# Patient Record
Sex: Male | Born: 1945 | Race: White | Hispanic: No | Smoking: Former smoker
Health system: Southern US, Community
[De-identification: ages and names within clinical notes are randomized; demographics above are authoritative.]

## PROBLEM LIST (undated history)

## (undated) DIAGNOSIS — I350 Nonrheumatic aortic (valve) stenosis: Secondary | ICD-10-CM

## (undated) DIAGNOSIS — I4891 Unspecified atrial fibrillation: Secondary | ICD-10-CM

## (undated) DIAGNOSIS — K5792 Diverticulitis of intestine, part unspecified, without perforation or abscess without bleeding: Secondary | ICD-10-CM

## (undated) DIAGNOSIS — N321 Vesicointestinal fistula: Secondary | ICD-10-CM

## (undated) DIAGNOSIS — R197 Diarrhea, unspecified: Secondary | ICD-10-CM

## (undated) DIAGNOSIS — I428 Other cardiomyopathies: Secondary | ICD-10-CM

## (undated) DIAGNOSIS — I499 Cardiac arrhythmia, unspecified: Secondary | ICD-10-CM

## (undated) DIAGNOSIS — F419 Anxiety disorder, unspecified: Secondary | ICD-10-CM

## (undated) DIAGNOSIS — R51 Headache: Secondary | ICD-10-CM

## (undated) DIAGNOSIS — N529 Male erectile dysfunction, unspecified: Secondary | ICD-10-CM

## (undated) DIAGNOSIS — R011 Cardiac murmur, unspecified: Secondary | ICD-10-CM

## (undated) HISTORY — DX: Nonrheumatic aortic (valve) stenosis: I35.0

## (undated) HISTORY — PX: HERNIA REPAIR: SHX51

## (undated) HISTORY — DX: Male erectile dysfunction, unspecified: N52.9

## (undated) HISTORY — PX: GUM SURGERY: SHX658

## (undated) HISTORY — PX: APPENDECTOMY: SHX54

## (undated) HISTORY — PX: COLON RESECTION: SHX5231

## (undated) HISTORY — DX: Other cardiomyopathies: I42.8

---

## 2002-01-11 ENCOUNTER — Ambulatory Visit (HOSPITAL_COMMUNITY): Admission: RE | Admit: 2002-01-11 | Discharge: 2002-01-11 | Payer: Self-pay | Admitting: Family Medicine

## 2002-01-11 ENCOUNTER — Encounter: Payer: Self-pay | Admitting: Family Medicine

## 2003-05-02 ENCOUNTER — Ambulatory Visit (HOSPITAL_COMMUNITY): Admission: RE | Admit: 2003-05-02 | Discharge: 2003-05-02 | Payer: Self-pay | Admitting: Family Medicine

## 2003-05-02 ENCOUNTER — Encounter: Payer: Self-pay | Admitting: Family Medicine

## 2005-05-05 ENCOUNTER — Ambulatory Visit (HOSPITAL_COMMUNITY): Admission: RE | Admit: 2005-05-05 | Discharge: 2005-05-05 | Payer: Self-pay | Admitting: Family Medicine

## 2006-05-14 ENCOUNTER — Ambulatory Visit (HOSPITAL_COMMUNITY): Admission: RE | Admit: 2006-05-14 | Discharge: 2006-05-14 | Payer: Self-pay | Admitting: Family Medicine

## 2006-12-10 ENCOUNTER — Encounter: Admission: RE | Admit: 2006-12-10 | Discharge: 2006-12-10 | Payer: Self-pay | Admitting: Neurology

## 2007-04-16 ENCOUNTER — Ambulatory Visit (HOSPITAL_COMMUNITY): Admission: RE | Admit: 2007-04-16 | Discharge: 2007-04-16 | Payer: Self-pay | Admitting: General Surgery

## 2007-04-20 ENCOUNTER — Ambulatory Visit (HOSPITAL_COMMUNITY): Admission: RE | Admit: 2007-04-20 | Discharge: 2007-04-20 | Payer: Self-pay | Admitting: General Surgery

## 2009-08-14 ENCOUNTER — Encounter (INDEPENDENT_AMBULATORY_CARE_PROVIDER_SITE_OTHER): Payer: Self-pay | Admitting: *Deleted

## 2009-09-03 ENCOUNTER — Ambulatory Visit: Payer: Self-pay | Admitting: Gastroenterology

## 2009-09-03 DIAGNOSIS — K5732 Diverticulitis of large intestine without perforation or abscess without bleeding: Secondary | ICD-10-CM

## 2009-09-12 ENCOUNTER — Telehealth (INDEPENDENT_AMBULATORY_CARE_PROVIDER_SITE_OTHER): Payer: Self-pay | Admitting: *Deleted

## 2009-11-20 ENCOUNTER — Encounter: Payer: Self-pay | Admitting: Gastroenterology

## 2009-12-14 ENCOUNTER — Ambulatory Visit (HOSPITAL_COMMUNITY): Admission: RE | Admit: 2009-12-14 | Discharge: 2009-12-14 | Payer: Self-pay | Admitting: Gastroenterology

## 2009-12-14 ENCOUNTER — Ambulatory Visit: Payer: Self-pay | Admitting: Gastroenterology

## 2010-03-26 ENCOUNTER — Ambulatory Visit (HOSPITAL_COMMUNITY): Admission: RE | Admit: 2010-03-26 | Discharge: 2010-03-26 | Payer: Self-pay | Admitting: Family Medicine

## 2010-08-20 NOTE — Letter (Signed)
Summary: Appointment Reminder  [facility]  [address]   [place], [place] [address]   Phone: [phone]  Fax: [phone]       August 14, 2009   Nicholas Sutton [address] [place], [place]  [address] Jul 02, 1946    Dear Nicholas Sutton,  We have been unable to reach you by phone to schedule a follow up   appointment that was recommended for you by Dr. Darrick Penna. It is very   important that we reach you to schedule an appointment. We hope that you  allow [place] to participate in your health care needs. Please contact us at  [phone] at your earliest convenience to schedule your appointment.  Sincerely,    [facility] R. Roetta Sessions, M.D.    Kassie Mends, M.D. Lorenza Burton, FNP-BC    Tana Coast, PA-C Phone: [phone]    Fax: [phone]

## 2010-08-20 NOTE — Progress Notes (Signed)
Summary: Question about Barium-contrast Enema  Phone Note Call from Patient Call back at Home Phone [phone] Call back at [phone]   Caller: Patient Reason for Call: Talk to Doctor Action Taken: Provider Notified Summary of Call: Patient called and stated he would like to speak to the doctor about this test..Marland KitchenHe wanted to know if he could do another procedure besides the air-contrast barium enema.  Please advise? Initial call taken by: Ave Filter,  September 12, 2009 2:25 PM     Appended Document: Question about Barium-contrast Enema Please call pt. The only alternative to BE is a CT scan of the PELVIS with rectal contrast. He need to have his left colon evaluated for stricture prior to prepping fopr a TCS.  Appended Document: Question about Barium-contrast Enema I gave pt Dr Darrick Penna recommendations and he stated he will call back in a few days to schedule.  Appended Document: Question about Barium-contrast Enema Flex sig with two enemas and should be on clear liquids beginning with breakfast the day before the flex sig and if I can get past the area. He may be able to have a complete exam. Schedule for flex sig/possible TCS-1 hour slot.  Appended Document: Question about Barium-contrast Enema I called pt to schedule procedure, no answer, lmom.

## 2010-08-20 NOTE — Assessment & Plan Note (Signed)
Summary: NPP/DIVERTICULITIS.GU   Visit Type:  Initial Consult Referring Provider:  Phillips Odor Primary Care Provider:  Phillips Odor  Chief Complaint:  Diverticulitis.  History of Present Illness: Mr. Nicholas Sutton is a pleasant 65 year old gentleman who presents today for further evaluation of recurrent diverticulitis. He was referred Dr. Assunta Found. He has had several episodes of diverticulitis in the last few years. From records received, he had diverticulitis at time of sigmoidoscopy in September 2008. This was after being treated with antibiotics one month prior in 02/2007. He was also treated empirically for diverticulitis in January 2009 and January 2011.  CT was done after incomplete colonoscopy by Dr. Lovell Sheehan see below. At time of procedure, prep was adequate, divertculitis was found in sigmoid colon, scope could not be passed past the 30cm mark and due to significant inflammation.   He usually has acute onset suprapubic cramping associated with fever. Last episode occurred after eating canned tomatoes and large pickles. He c/o intermittent abdominal cramping as well. Completed last round of Abx two weeks ago and is feeling better.    CT A/P (9/08) -->  1.  Abnormal sigmoid colon wall thickening along a tortuous segment with scattered diverticula.  Although diverticulitis and colitis are in the differential, a constricting lesion or malignancy is not excluded.   2.  No perforation, abscess, or obstruction.  3. Probable numerous hepatic cysts.  Current Medications (verified): 1)  Zyrtec  Otc .... Take 1 Tablet By Mouth Once A Day 2)  Asa 325 Mg .... As Needed For Ha 3)  Tylenol .... As Needed 4)  Advil .... As Needed  Allergies (verified): No Known Drug Allergies  Past History:  Past Medical History: Flex sig,  to 30cm, 9/08, Dr. Clydene Pugh in sigmoid colon  Anxiety Disorder Asthma Diverticulitis    Past Surgical History: Hernia Surgery, inguinal Appendectomy  Family  History: No FH CRC, liver, chronic GI illnesses Father, MI Mother, CVA cousin, throat cancer cousin, brain cancer cousin, cancer  Social History: Single. No children. Quit tob 10 years ago. No alcohol. No drugs. Multimedia programmer, [employer]  Review of Systems General:  Denies fever, chills, sweats, anorexia, and weight loss. Eyes:  Denies vision loss. ENT:  Denies nasal congestion, hoarseness, and difficulty swallowing. CV:  Denies chest pains, angina, palpitations, dyspnea on exertion, and peripheral edema. Resp:  Denies dyspnea at rest, dyspnea with exercise, and cough. GI:  See HPI. GU:  Denies urinary burning and blood in urine. MS:  Denies joint pain / LOM. Derm:  Denies rash and itching. Neuro:  Denies weakness, paralysis, frequent headaches, memory loss, and confusion. Psych:  Denies depression and anxiety. Endo:  Denies unusual weight change. Heme:  Denies bruising and bleeding. Allergy:  Denies hives and rash.  Vital Signs:  Patient profile:   65 year old male Height:      72 inches Weight:      168.50 pounds BMI:     22.94 Temp:     98.2 degrees F Pulse rate:   72 / minute BP sitting:   120 / 80  (left arm) Cuff size:   regular  Vitals Entered By: Cloria Spring LPN (September 03, 2009 8:42 AM)  Physical Exam  General:  Well developed, well nourished, no acute distress. Head:  Normocephalic and atraumatic. Eyes:  Conjunctivae pink, no scleral icterus.  Mouth:  Oropharyngeal mucosa moist, pink.  No lesions, erythema or exudate.    Neck:  Supple; no masses or thyromegaly. Lungs:  Clear throughout to auscultation. Heart:  Regular rate and rhythm; no murmurs, rubs,  or bruits. Abdomen:  Soft. Normal BS. Minimal tenderness in llq. No rebound or guarding. No abd hernia or masses. No HSM. No abd bruit. Extremities:  No clubbing, cyanosis, edema or deformities noted. Neurologic:  Alert and  oriented x4;  grossly normal neurologically. Skin:  Intact without  significant lesions or rashes. Cervical Nodes:  No significant cervical adenopathy. Psych:  Alert and cooperative. Normal mood and affect.  Impression & Recommendations:  Problem # 1:  DIVERTICULITIS, COLON (ICD-562.11)  Sounds like recurrent diverticulitis. Prior CT imaging in 2008 concerning. He has never had complete colonoscopy. Discussed with Dr. Darrick Penna. Discussed previous CT and Flex sig. She recommends prior ACBE prior to TCS to r/o colonic stricture.  He also has some intermittent abdominal cramping, will try Hyomax.   Orders: Consultation Level III (91478) Prescriptions: HYOMAX-SL 0.125 MG SUBL (HYOSCYAMINE SULFATE) one to two SL up to four times a day as needed for abd cramps  #120 x 1   Entered and Authorized by:   Leanna Battles. Dixon Boos   Signed by:   Leanna Battles Dixon Boos on 09/03/2009   Method used:   Electronically to        [facility] (retail)       [address]       [place], [place]  [address]       Ph: [phone]       Fax: [phone]   RxID:   [id number]     I would like to thank Dr. Phillips Odor for allowing [place] to take part in the care of this nice patient.  Appended Document: NPP/DIVERTICULITIS.GU Tried to call patient. Please let him know. Prior imaging studies reviewed with Dr. Darrick Penna. She recommends air-contrast barium enema prior to colonoscopy in order to r/o colonic stricture. Please arrange.  Appended Document: NPP/DIVERTICULITIS.GU LMOM for pt to call.  Appended Document: NPP/DIVERTICULITIS.GU LMOM to call.  Appended Document: NPP/DIVERTICULITIS.GU Pt informed. He's not quite sure he wants to have it done, he had something similar by Dr. Lovell Sheehan, and he declared he would never have it done again.   Appended Document: NPP/DIVERTICULITIS.GU Pt stated he doesn't want to have this done he will need some time to think about this and he will call me back.  Appended Document: NPP/DIVERTICULITIS.GU Pt called wanting  to set up his procedure..Do you want me to just triage him? Please advise?  Appended Document: NPP/DIVERTICULITIS.GU TRIAGE PT.  Appended Document: NPP/DIVERTICULITIS.GU I called pt to triage for procedure, no answer,lmom.  Appended Document: NPP/DIVERTICULITIS.GU AUG 2008: 179 LBS, FLEX SIG 2o to abd pain-DIVERTICULITIS, Dem 75, V 26 Jul 2007: 172 LBS  JAN 2011: 166 LBS

## 2010-08-20 NOTE — Letter (Signed)
Summary: TRIAGE ORDER  TRIAGE ORDER   Imported By: Ave Filter 11/20/2009 15:46:19  _____________________________________________________________________  External Attachment:    Type:   Image     Comment:   External Document

## 2010-12-03 NOTE — H&P (Signed)
NAME:  RETT, STEHLIK NO.:  [id number]   MEDICAL RECORD NO.:  [id number]          PATIENT TYPE:  AMB   LOCATION:  DAY                           FACILITY:  [facility]   PHYSICIAN:  Dalia Heading, M.D.  DATE OF BIRTH:  25-Aug-1945   DATE OF ADMISSION:  DATE OF DISCHARGE:  LH                              HISTORY & PHYSICAL   PREADMISSION HISTORY AND PHYSICAL   PATIENT NAME:  Nicholas Sutton.   DATE OF BIRTH:  03/18/1946.   CHIEF COMPLAINT:  Abdominal pain.   HISTORY OF PRESENT ILLNESS:  The patient is a 65 year old white male,  who is referred for endoscopic evaluation.  Needs a colonoscopy for  abdominal pain.  He had an episode of lower suprapubic pain  approximately one month ago, treated with an antibiotic, but it is not  fully resolved.  No weight loss, nausea, vomiting, diarrhea,  constipation, or melena had been noted.  He had never had a colonoscopy.  There is no family history of colon carcinoma.   PAST MEDICAL HISTORY:  Extrinsic allergies.   PAST SURGICAL HISTORY:  A herniorrhaphy in the remote past.   CURRENT MEDICATIONS:  Zyrtec, nasal spray, dicyclomine.   ALLERGIES:  No known drug allergies.   REVIEW OF SYSTEMS:  Noncontributory.   PHYSICAL EXAMINATION:  GENERAL:  The patient is a well-developed, well-  nourished, white male in no acute distress.  LUNGS:  Clear to auscultation with equal breath sounds bilaterally.  CARDIAC:  A regular rate and rhythm without S3, S4, or murmurs.  ABDOMEN:  Soft, nontender, and nondistended.  No hepatosplenomegaly or  masses are noted.  RECTAL EXAMINATION:  Deferred to the procedure pending.   IMPRESSION:  Abdominal pain.   PLAN:  The patient is scheduled for a colonoscopy on April 16, 2007.  The risks and benefits of the procedure including bleeding and  perforation were fully explained to the patient, he gave informed  consent.      Dalia Heading, M.D.  Electronically Signed     MAJ/MEDQ  D:  03/25/2007  T:  03/25/2007  Job:  [id number]   cc:   Kirk Ruths, M.D.  Fax: [phone]

## 2010-12-03 NOTE — H&P (Signed)
NAME:  LUISMANUEL, CORMAN NO.:  [id number]   MEDICAL RECORD NO.:  [id number]          PATIENT TYPE:  AMB   LOCATION:  DAY                           FACILITY:  [facility]   PHYSICIAN:  Dalia Heading, M.D.  DATE OF BIRTH:  1946-05-14   DATE OF ADMISSION:  DATE OF DISCHARGE:  LH                              HISTORY & PHYSICAL   PREADMISSION HISTORY AND PHYSICAL   PATIENT NAME:  Nicholas Sutton.   DATE OF BIRTH:  10-18-1945.   CHIEF COMPLAINT:  Abdominal pain.   HISTORY OF PRESENT ILLNESS:  The patient is a 65 year old white male,  who is referred for endoscopic evaluation.  Needs a colonoscopy for  abdominal pain.  He had an episode of lower suprapubic pain  approximately one month ago, treated with an antibiotic, but it is not  fully resolved.  No weight loss, nausea, vomiting, diarrhea,  constipation, or melena had been noted.  He had never had a colonoscopy.  There is no family history of colon carcinoma.   PAST MEDICAL HISTORY:  Extrinsic allergies.   PAST SURGICAL HISTORY:  A herniorrhaphy in the remote past.   CURRENT MEDICATIONS:  Zyrtec, nasal spray, dicyclomine.   ALLERGIES:  No known drug allergies.   REVIEW OF SYSTEMS:  Noncontributory.   PHYSICAL EXAMINATION:  GENERAL:  The patient is a well-developed, well-  nourished, white male in no acute distress.  LUNGS:  Clear to auscultation with equal breath sounds bilaterally.  CARDIAC:  A regular rate and rhythm without S3, S4, or murmurs.  ABDOMEN:  Soft, nontender, and nondistended.  No hepatosplenomegaly or  masses are noted.  RECTAL EXAMINATION:  Deferred to the procedure pending.   IMPRESSION:  Abdominal pain.   PLAN:  The patient is scheduled for a colonoscopy on April 16, 2007.  The risks and benefits of the procedure including bleeding and  perforation were fully explained to the patient, he gave informed  consent.      Dalia Heading, M.D.  Electronically Signed     MAJ/MEDQ  D:  03/25/2007  T:  03/25/2007  Job:  [id number]   cc:   Kirk Ruths, M.D.  Fax: [phone]

## 2013-05-09 ENCOUNTER — Emergency Department (HOSPITAL_COMMUNITY): Payer: BC Managed Care – PPO

## 2013-05-09 ENCOUNTER — Emergency Department (HOSPITAL_COMMUNITY)
Admission: EM | Admit: 2013-05-09 | Discharge: 2013-05-09 | Disposition: A | Discharge status: Home / Self Care | Payer: BC Managed Care – PPO | Attending: Emergency Medicine | Admitting: Emergency Medicine

## 2013-05-09 ENCOUNTER — Encounter (HOSPITAL_COMMUNITY): Payer: Self-pay | Admitting: Emergency Medicine

## 2013-05-09 DIAGNOSIS — K5720 Diverticulitis of large intestine with perforation and abscess without bleeding: Secondary | ICD-10-CM

## 2013-05-09 DIAGNOSIS — R350 Frequency of micturition: Secondary | ICD-10-CM | POA: Insufficient documentation

## 2013-05-09 DIAGNOSIS — N321 Vesicointestinal fistula: Secondary | ICD-10-CM | POA: Diagnosis present

## 2013-05-09 DIAGNOSIS — R112 Nausea with vomiting, unspecified: Secondary | ICD-10-CM | POA: Insufficient documentation

## 2013-05-09 DIAGNOSIS — R319 Hematuria, unspecified: Secondary | ICD-10-CM | POA: Insufficient documentation

## 2013-05-09 DIAGNOSIS — K5732 Diverticulitis of large intestine without perforation or abscess without bleeding: Secondary | ICD-10-CM

## 2013-05-09 DIAGNOSIS — K632 Fistula of intestine: Secondary | ICD-10-CM | POA: Insufficient documentation

## 2013-05-09 DIAGNOSIS — Z79899 Other long term (current) drug therapy: Secondary | ICD-10-CM | POA: Insufficient documentation

## 2013-05-09 DIAGNOSIS — Z8719 Personal history of other diseases of the digestive system: Secondary | ICD-10-CM | POA: Insufficient documentation

## 2013-05-09 DIAGNOSIS — R011 Cardiac murmur, unspecified: Secondary | ICD-10-CM | POA: Insufficient documentation

## 2013-05-09 HISTORY — DX: Diverticulitis of intestine, part unspecified, without perforation or abscess without bleeding: K57.92

## 2013-05-09 LAB — URINE MICROSCOPIC-ADD ON

## 2013-05-09 LAB — CBC WITH DIFFERENTIAL/PLATELET
HCT: 42.8 % (ref 39.0–52.0)
Hemoglobin: 14.6 g/dL (ref 13.0–17.0)
Lymphocytes Relative: 18 % (ref 12–46)
Lymphs Abs: 1.3 10*3/uL (ref 0.7–4.0)
MCHC: 34.1 g/dL (ref 30.0–36.0)
Monocytes Absolute: 0.6 10*3/uL (ref 0.1–1.0)
Monocytes Relative: 9 % (ref 3–12)
Neutro Abs: 4.9 10*3/uL (ref 1.7–7.7)
Neutrophils Relative %: 69 % (ref 43–77)
RBC: 4.95 MIL/uL (ref 4.22–5.81)
WBC: 7.1 10*3/uL (ref 4.0–10.5)

## 2013-05-09 LAB — BASIC METABOLIC PANEL
BUN: 14 mg/dL (ref 6–23)
Chloride: 96 mEq/L (ref 96–112)
GFR calc Af Amer: >90 mL/min (ref >90)
GFR calc non Af Amer: >90 mL/min (ref >90)
Glucose, Bld: 90 mg/dL (ref 70–99)
Potassium: 3.9 mEq/L (ref 3.5–5.1)
Sodium: 132 mEq/L — ABNORMAL LOW (ref 135–145)

## 2013-05-09 LAB — URINALYSIS, ROUTINE W REFLEX MICROSCOPIC
Bilirubin Urine: NEGATIVE
Glucose, UA: NEGATIVE mg/dL
Specific Gravity, Urine: 1.021 (ref 1.005–1.030)
pH: 5 (ref 5.0–8.0)

## 2013-05-09 MED ORDER — IOHEXOL 300 MG/ML  SOLN
100.0000 mL | Freq: Once | INTRAMUSCULAR | Status: AC | PRN
  Administered 2013-05-09: 100 mL via INTRAVENOUS

## 2013-05-09 MED ORDER — CIPROFLOXACIN IN D5W 400 MG/200ML IV SOLN
400.0000 mg | Freq: Once | INTRAVENOUS | Status: AC
  Administered 2013-05-09: 400 mg via INTRAVENOUS
  Filled 2013-05-09: qty 200

## 2013-05-09 MED ORDER — SODIUM CHLORIDE 0.9 % IV BOLUS (SEPSIS)
1000.0000 mL | Freq: Once | INTRAVENOUS | Status: AC
  Administered 2013-05-09: 1000 mL via INTRAVENOUS

## 2013-05-09 MED ORDER — ONDANSETRON 8 MG PO TBDP: 8.0000 mg | ORAL_TABLET | Freq: Three times a day (TID) | ORAL | Status: DC | PRN

## 2013-05-09 MED ORDER — IOHEXOL 300 MG/ML  SOLN
25.0000 mL | INTRAMUSCULAR | Status: AC
  Administered 2013-05-09 (×2): 25 mL via ORAL

## 2013-05-09 MED ORDER — METRONIDAZOLE IN NACL 5-0.79 MG/ML-% IV SOLN
500.0000 mg | Freq: Once | INTRAVENOUS | Status: AC
  Administered 2013-05-09: 500 mg via INTRAVENOUS
  Filled 2013-05-09: qty 100

## 2013-05-09 NOTE — ED Notes (Signed)
Pt sent from his Doctor in [place] for further eval of abdominal pain, blood in urine, and urinary frequency. Pt reports he thought he had diverticulitis but his Dr told him he had e-coli. Symptoms onset Thursday.

## 2013-05-09 NOTE — ED Provider Notes (Addendum)
CSN: [id number]     Arrival date & time 05/09/13  1030 History   First MD Initiated Contact with Patient 05/09/13 1109     Chief Complaint  Patient presents with  . Hematuria  . Urinary Frequency  . Abdominal Pain   (Consider location/radiation/quality/duration/timing/severity/associated sxs/prior Treatment) HPI Comments: Pt comes in with cc of abdominal pain, dysuria, hematuria. Pt has hx of diverticular disease. States that he has been having some abd discomfort and UTI like sx, he is on cipro and flagyl now as his PCP is treating him for diverticulitis. Comes to the ED per request of PCP for a scan.  Pt reports that with urination, he has pain, he has more urgency to go with no void, and he hears some gargling noise when he is urinating. No hx of uti. No hx of hematuria.  Patient is a 67 y.o. male presenting with hematuria, frequency, and abdominal pain. The history is provided by the patient.  Hematuria Associated symptoms include abdominal pain. Pertinent negatives include no chest pain and no shortness of breath.  Urinary Frequency Associated symptoms include abdominal pain. Pertinent negatives include no chest pain and no shortness of breath.  Abdominal Pain Associated symptoms: hematuria, nausea and vomiting   Associated symptoms: no chest pain, no cough, no dysuria and no shortness of breath     Past Medical History  Diagnosis Date  . Diverticulitis    Past Surgical History  Procedure Laterality Date  . Appendectomy    . Hernia repair     No family history on file. History  Substance Use Topics  . Smoking status: Never Smoker   . Smokeless tobacco: Not on file  . Alcohol Use: Yes    Review of Systems  Constitutional: Negative for activity change and appetite change.  Respiratory: Negative for cough and shortness of breath.   Cardiovascular: Negative for chest pain.  Gastrointestinal: Positive for nausea, vomiting and abdominal pain.  Genitourinary: Positive for  frequency and hematuria. Negative for dysuria.    Allergies  Review of patient's allergies indicates no known allergies.  Home Medications   Current Outpatient Rx  Name  Route  Sig  Dispense  Refill  . cetirizine (ZYRTEC) 10 MG tablet   Oral   Take 10 mg by mouth daily.         . ciprofloxacin (CIPRO) 500 MG tablet   Oral   Take 500 mg by mouth 2 (two) times daily. Starting 05/07/13 for 10 days         . metroNIDAZOLE (FLAGYL) 500 MG tablet   Oral   Take 500 mg by mouth 3 (three) times daily. Starting 05/07/13 for 7 days          BP 123/82  Pulse 88  Temp(Src) 98.3 F (36.8 C) (Oral)  Resp 18  Ht 6' (1.829 m)  Wt 160 lb (72.576 kg)  BMI 21.7 kg/m2  SpO2 97% Physical Exam  Constitutional: He is oriented to person, place, and time. He appears well-developed.  HENT:  Head: Normocephalic and atraumatic.  Eyes: Conjunctivae and EOM are normal. Pupils are equal, round, and reactive to light.  Neck: Normal range of motion. Neck supple.  Cardiovascular: Normal rate and regular rhythm.   Murmur heard. Pulmonary/Chest: Effort normal and breath sounds normal.  Abdominal: Soft. Bowel sounds are normal. He exhibits no distension. There is tenderness. There is no rebound and no guarding.  Lower quadrant tenderness  Neurological: He is alert and oriented to person, place, and time.  Skin: Skin is warm.    ED Course  Procedures (including critical care time) Labs Review Labs Reviewed  URINALYSIS, ROUTINE W REFLEX MICROSCOPIC - Abnormal; Notable for the following:    APPearance TURBID (*)    Hgb urine dipstick LARGE (*)    Ketones, ur 40 (*)    Protein, ur 30 (*)    Leukocytes, UA LARGE (*)    All other components within normal limits  URINE MICROSCOPIC-ADD ON - Abnormal; Notable for the following:    Bacteria, UA FEW (*)    All other components within normal limits  BASIC METABOLIC PANEL - Abnormal; Notable for the following:    Sodium 132 (*)    All other  components within normal limits  URINE CULTURE  CBC WITH DIFFERENTIAL   Imaging Review Ct Abdomen Pelvis W Contrast  05/09/2013   CLINICAL DATA:  Blood in urine. Abdominal pain. History of diverticulitis. 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  EXAM: CT ABDOMEN AND PELVIS WITH CONTRAST  TECHNIQUE: Multidetector CT imaging of the abdomen and pelvis was performed using the standard protocol following bolus administration of intravenous contrast.  CONTRAST:  OMNIPAQUE IOHEXOL 300 MG/ML  SOLN  COMPARISON:  04/20/2007.  FINDINGS: Diffuse sigmoid diverticulitis with abscess within the sigmoid mesentery. Fistula to the dome of the bladder which is diffusely inflamed containing gas.  After this inflammatory process has cleared, evaluation of the sigmoid colon recommended to exclude the possibly of underlying malignancy.  Calcified asymmetrically enlarged prostate gland. Clinical and laboratory correlation recommended.  Atherosclerotic type changes of the aorta the aneurysm measuring up to 3.4 x 2.9 cm (previously 3.2 x 2.8 cm). 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 Minimal narrowing superior mesenteric artery. Atherosclerotic type changes with moderate narrowing of the common iliac arteries. Mild narrowing femoral arteries.  Liver lesions appear relatively similar to prior exam and may represent cysts.  No worrisome splenic, pancreatic, renal or adrenal lesion. No calcified gallstone.  No bony destructive lesion.  No adenopathy.  IMPRESSION: Diffuse sigmoid diverticulitis with abscess within the sigmoid mesentery. Fistula to the dome of the bladder which is diffusely inflamed containing gas.  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  Calcified asymmetrically enlarged prostate gland. Clinical and laboratory correlation recommended.  Please see above discussion.   Electronically Signed   By: Bridgett Larsson M.D.   On: 05/09/2013 15:21     EKG Interpretation   None       MDM  No diagnosis found. Pt comes in with cc of abd pain. Pt has hx of diverticular dz, lower quadrant abd pain. Pt has some UTI like sx, with ecoli in his urine. He is also passing, what appears to be gas, with urination. Concerns for colovesiculo fistula. CT ordered-  Which shows gas in the bladder, essentially confirming a fistula and also an abscess. Surgery consulted for the diverticular abscess.  Pt has no fevers here, but he has been having intermittent fever at home.  Derwood Kaplan, MD 05/09/13 1544  4:56 PM Surgery reviewed the CT, spoke with patient, and want to manage him as an outpatient. Will d.c Pt has been given return precautions.  Derwood Kaplan, MD 05/09/13 [phone]

## 2013-05-09 NOTE — Consult Note (Signed)
Patient interviewed and examined, agree with PA note above. Patient does not appear acutely ill and his abdomen is nontender. I think he has a mild flareup of acute diverticulitis on top of severe chronic diverticulitis with colovesical fistula. I discussed with him that he will need resection which would be best done on an elective basis after we have treated his acute diverticulitis. This can be safely done as an outpatient. I will follow him up in the office in 2 weeks or sooner if needed.  Mariella Saa MD, FACS  05/09/2013 5:05 PM

## 2013-05-09 NOTE — ED Notes (Signed)
Patient will be discharged after completion of several IV antibiotics.

## 2013-05-09 NOTE — Consult Note (Signed)
Nicholas Sutton 1945/12/07  [id number].   Primary Care MD: Dr. Assunta Found Requesting MD: Dr. Rhunette Croft Chief Complaint/Reason for Consult: diverticulitis with abscess and colovesical fistula HPI:  This is a 67 year old white male who has had a 10 year history of intermittent episodes of diverticulitis. He has never been admitted for these. He has received outpatient therapy with oral Cipro and Flagyl. He seemed to resolve on their own. He has had a sigmoidoscopy which revealed some stricturing around 30-50 cm from the anal verge. A full colonoscopy was unable to be performed because of these findings.  This past Thursday the patient developed left lower quadrant abdominal pain. He was having frequent small diarrhea-like bowel movements. He was able to make it through work Thursday and Friday. However early Saturday morning he noticed he had blood in his urine. He went to see his primary care physician as is concerned him. He was placed on oral Cipro and Flagyl. He began improving some Saturday and Sunday however he began having more blood in his urine. He called his physician today who referred him to the emergency department. Currently the patient is not having any further abdominal pain is this ceased on Friday. Upon arrival he was found to have a normal white blood cell count with no fever. He did have a CT scan which revealed changes consistent with diverticulitis. There was possibly a small mesenteric phlegmon. 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 We have been asked to evaluate the patient for further recommendations.  ROS: Please see history of present illness otherwise all other systems have been reviewed and are negative  No family history on file.  Past Medical History  Diagnosis Date  . Diverticulitis     Past Surgical History  Procedure Laterality Date  . Appendectomy    . Hernia repair      Social History:  reports that he has never smoked. He  does not have any smokeless tobacco history on file. He reports that he drinks alcohol. His drug history is not on file.  Allergies: No Known Allergies   (Not in a hospital admission)  Blood pressure 123/82, pulse 88, temperature 98.3 F (36.8 C), temperature source Oral, resp. rate 18, height 6' (1.829 m), weight 160 lb (72.576 kg), SpO2 97.00%. Physical Exam: General: pleasant, WD, WN white male who is laying in bed in NAD HEENT: head is normocephalic, atraumatic.  Sclera are noninjected.  PERRL.  Ears and nose without any masses or lesions.  Mouth is pink and moist Heart: regular, rate, and rhythm.  Normal s1,s2. No obvious gallops, or rubs noted. +Murmur  Palpable radial and pedal pulses bilaterally Lungs: CTAB, no wheezes, rhonchi, or rales noted.  Respiratory effort nonlabored Abd: soft, NT, ND, +BS, no masses, hernias, or organomegaly, some slight fullness in the lower central portion of his abdomen MS: all 4 extremities are symmetrical with no cyanosis, clubbing, or edema. Skin: warm and dry with no masses, lesions, or rashes Psych: A&Ox3 with an appropriate affect.    Results for orders placed during the hospital encounter of 05/09/13 (from the past 48 hour(s))  URINALYSIS, ROUTINE W REFLEX MICROSCOPIC     Status: Abnormal   Collection Time    05/09/13 11:04 AM      Result Value Range   Color, Urine YELLOW  YELLOW   APPearance TURBID (*) CLEAR   Specific Gravity, Urine 1.021  1.005 - 1.030   pH 5.0  5.0 - 8.0  Glucose, UA NEGATIVE  NEGATIVE mg/dL   Hgb urine dipstick LARGE (*) NEGATIVE   Bilirubin Urine NEGATIVE  NEGATIVE   Ketones, ur 40 (*) NEGATIVE mg/dL   Protein, ur 30 (*) NEGATIVE mg/dL   Urobilinogen, UA 1.0  0.0 - 1.0 mg/dL   Nitrite NEGATIVE  NEGATIVE   Leukocytes, UA LARGE (*) NEGATIVE  URINE MICROSCOPIC-ADD ON     Status: Abnormal   Collection Time    05/09/13 11:04 AM      Result Value Range   Squamous Epithelial / LPF RARE  RARE   WBC, UA 21-50  <3  WBC/hpf   RBC / HPF 3-6  <3 RBC/hpf   Bacteria, UA FEW (*) RARE   Urine-Other LESS THAN 10 mL OF URINE SUBMITTED     Comment: MICROSCOPIC EXAM PERFORMED ON UNCONCENTRATED URINE  CBC WITH DIFFERENTIAL     Status: None   Collection Time    05/09/13 12:30 PM      Result Value Range   WBC 7.1  4.0 - 10.5 K/uL   RBC 4.95  4.22 - 5.81 MIL/uL   Hemoglobin 14.6  13.0 - 17.0 g/dL   HCT 40.9  81.1 - 91.4 %   MCV 86.5  78.0 - 100.0 fL   MCH 29.5  26.0 - 34.0 pg   MCHC 34.1  30.0 - 36.0 g/dL   RDW 78.2  95.6 - 21.3 %   Platelets 221  150 - 400 K/uL   Neutrophils Relative % 69  43 - 77 %   Neutro Abs 4.9  1.7 - 7.7 K/uL   Lymphocytes Relative 18  12 - 46 %   Lymphs Abs 1.3  0.7 - 4.0 K/uL   Monocytes Relative 9  3 - 12 %   Monocytes Absolute 0.6  0.1 - 1.0 K/uL   Eosinophils Relative 4  0 - 5 %   Eosinophils Absolute 0.3  0.0 - 0.7 K/uL   Basophils Relative 1  0 - 1 %   Basophils Absolute 0.1  0.0 - 0.1 K/uL  BASIC METABOLIC PANEL     Status: Abnormal   Collection Time    05/09/13 12:30 PM      Result Value Range   Sodium 132 (*) 135 - 145 mEq/L   Potassium 3.9  3.5 - 5.1 mEq/L   Chloride 96  96 - 112 mEq/L   CO2 23  19 - 32 mEq/L   Glucose, Bld 90  70 - 99 mg/dL   BUN 14  6 - 23 mg/dL   Creatinine, Ser 0.86  0.50 - 1.35 mg/dL   Calcium 9.2  8.4 - 57.8 mg/dL   GFR calc non Af Amer >90  >90 mL/min   GFR calc Af Amer >90  >90 mL/min   Comment: (NOTE)     The eGFR has been calculated using the CKD EPI equation.     This calculation has not been validated in all clinical situations.     eGFR's persistently <90 mL/min signify possible Chronic Kidney     Disease.   Ct Abdomen Pelvis W Contrast  05/09/2013   CLINICAL DATA:  Blood in urine. Abdominal pain. History of diverticulitis. 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  EXAM: CT ABDOMEN AND PELVIS WITH CONTRAST  TECHNIQUE: Multidetector CT imaging of the abdomen and pelvis was performed using the standard protocol following bolus  administration of intravenous contrast.  CONTRAST:  OMNIPAQUE IOHEXOL 300 MG/ML  SOLN  COMPARISON:  04/20/2007.  FINDINGS: Diffuse sigmoid  diverticulitis with abscess within the sigmoid mesentery. Fistula to the dome of the bladder which is diffusely inflamed containing gas.  After this inflammatory process has cleared, evaluation of the sigmoid colon recommended to exclude the possibly of underlying malignancy.  Calcified asymmetrically enlarged prostate gland. Clinical and laboratory correlation recommended.  Atherosclerotic type changes of the aorta the aneurysm measuring up to 3.4 x 2.9 cm (previously 3.2 x 2.8 cm). 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 Minimal narrowing superior mesenteric artery. Atherosclerotic type changes with moderate narrowing of the common iliac arteries. Mild narrowing femoral arteries.  Liver lesions appear relatively similar to prior exam and may represent cysts.  No worrisome splenic, pancreatic, renal or adrenal lesion. No calcified gallstone.  No bony destructive lesion.  No adenopathy.  IMPRESSION: Diffuse sigmoid diverticulitis with abscess within the sigmoid mesentery. Fistula to the dome of the bladder which is diffusely inflamed containing gas.  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  Calcified asymmetrically enlarged prostate gland. Clinical and laboratory correlation recommended.  Please see above discussion.   Electronically Signed   By: Bridgett Larsson M.D.   On: 05/09/2013 15:21       Assessment/Plan 1. Diverticulitis with small mesenteric abscess/phlegmon, colovesical fistula 2. Hematuria, secondary to #1 3. History of chronic diverticulitis 4. History of a cardiac murmur  Plan: The patient is currently stable and does not appear acutely ill.  Dr. Johna Sheriff has reviewed the patient's CT scan. We have also discussed a CT scan with the interventional radiologist. The  patient is stable for discharge home with close followup in 2-3 weeks with Dr. Johna Sheriff. He will need to remain on Cipro and Flagyl for his diverticulitis and his urinary tract infection which is secondary to his colovesical fistula. We have discussed with the patient the plan to try to get his acute diverticular flare under control and then plan for elective surgical intervention for sigmoid colectomy and repair of fistula. The patient and his wife understand and are agreeable. The patient had a multitude of questions which were all answered. We have informed him that he begins developing worsening fevers or abdominal pain or nausea or vomiting he needs to give our office a call. The patient is happy with going home. He will followup in our office in 2-3 weeks.  Rukaya Kleinschmidt E 05/09/2013, 4:29 PM Pager: [phone]

## 2013-05-09 NOTE — ED Notes (Addendum)
Anxiety r/t need for surgery and need for colostomy bag aeb increased HR and statement of anxiety. Pt to ask about pre/post-op surgery requirements. Pt received education about basic pre-op and post-op concerns related to bowel rest, remaining active, and basic dietary concerns.  Pt stated fears about a permanent colostomy bag and surgery to reanastomose the bowel after surgery. Pt verbalized understanding and stated he had a decrease in anxiety.

## 2013-05-10 ENCOUNTER — Telehealth (INDEPENDENT_AMBULATORY_CARE_PROVIDER_SITE_OTHER): Payer: Self-pay

## 2013-05-10 ENCOUNTER — Telehealth (INDEPENDENT_AMBULATORY_CARE_PROVIDER_SITE_OTHER): Payer: Self-pay | Admitting: General Surgery

## 2013-05-10 LAB — URINE CULTURE

## 2013-05-10 NOTE — Telephone Encounter (Signed)
Nicholas Sutton called to clarify why his hospital discharge instructions included him to see a urologist.  When he called to set up the appt (with Nicholas Sutton) he did not know what to tell them he needed.  Reviewed the hospital notes of the PA and Nicholas Sutton; neither of them stated need to see urologist.  He states his diverticulitis flare is improving and he has appt with Nicholas Sutton in November [to discuss elective surgery for colectomy and colovesical fistula repair.]  Will page Nicholas Sutton tomorrow for clarification and call Nicholas Sutton with answer from surgeon.

## 2013-05-10 NOTE — Telephone Encounter (Signed)
Called and spoke to patient regarding follow up appointment scheduled for [id number] @ 9:45 am w/Dr. Johna Sheriff.  Patient reports having traces of blood in his urine.  Patient states he did not have a foley catheter placed while at hospital.  Reviewed with Dr. Magnus Ivan, advised patient that this should resolve in the next few days however, if he continues to have blood in his urine he need's to contact our office or to go the the ER for further workup and evaluation.  Patient wasn't sure about the treatment process and DC instructions.  I advised patient per surgical consult note, "He will need Surgical Resection which would best be done on an elective basis after we have treated his acute diverticulitis. This can be safely done as an outpatient" Patient current treatment antibiotics and to follow up with Dr. Johna Sheriff on 06/03/13 to discuss further treatment.  Patient verbalized understanding and agrees with plan as above.

## 2013-05-11 NOTE — Telephone Encounter (Signed)
Clarified with Dr. Johna Sheriff and called pt:  He does NOT need to see urologist.  Pt understands.

## 2013-05-17 ENCOUNTER — Telehealth (INDEPENDENT_AMBULATORY_CARE_PROVIDER_SITE_OTHER): Payer: Self-pay | Admitting: *Deleted

## 2013-05-17 NOTE — Telephone Encounter (Signed)
Message copied by Consuelo Pandy on Tue May 17, 2013  3:04 PM ------      Message from: Glenna Fellows T      Created: Tue May 17, 2013  2:31 PM       His symptoms are not unexpected and are not dangerous. Continue antibiotics and keep the present appointment. ------

## 2013-05-17 NOTE — Telephone Encounter (Signed)
Patient called and is very anxious.  Patient has multiple questions regarding what is going on and how soon he can have surgery.  Patient reports that he has been having blood in his urine however now he states it looks like there could be feces in his urine.  Patient states mild abdominal pain with cramping.  Patient asking if his symptoms have to be completely resolved before he can have surgery or whether with his symptoms being mild surgery could be done.  Patient is aware of his appt with Dr. Johna Sheriff on 06/03/13 however patient is wanting to move forward as soon as possible.  Patient states he was told by Dr. Johna Sheriff that he would definitely have to have surgery.  Patient states "I just don't want the symptoms to come back like they were and this become an emergency".  Explained to patient that I would send a message on to Dr. Johna Sheriff to update him on these new symptoms and to find out what his opinion is.  Patient states understanding and agreeable at this time.

## 2013-05-17 NOTE — Telephone Encounter (Signed)
Patient updated with below message from Dr. Johna Sheriff.  The below message is in response to a message I sent him this morning which is listed under another telephone encounter.  Patient states understanding at this time.

## 2013-05-26 ENCOUNTER — Other Ambulatory Visit: Payer: Self-pay

## 2013-06-03 ENCOUNTER — Ambulatory Visit (INDEPENDENT_AMBULATORY_CARE_PROVIDER_SITE_OTHER): Payer: BC Managed Care – PPO | Admitting: General Surgery

## 2013-06-03 ENCOUNTER — Encounter (INDEPENDENT_AMBULATORY_CARE_PROVIDER_SITE_OTHER): Payer: Self-pay | Admitting: General Surgery

## 2013-06-03 VITALS — BP 122/74 | HR 88 | Temp 97.0°F | Resp 14 | Ht 72.0 in | Wt 157.4 lb

## 2013-06-03 DIAGNOSIS — K5732 Diverticulitis of large intestine without perforation or abscess without bleeding: Secondary | ICD-10-CM

## 2013-06-03 DIAGNOSIS — N321 Vesicointestinal fistula: Secondary | ICD-10-CM

## 2013-06-03 NOTE — Progress Notes (Signed)
Chief Complaint: diverticulitis with abscess and colovesical fistula   HPI: This is a 67 year old white male who has had a 10 year history of intermittent episodes of diverticulitis. He has never been admitted for these. He has received outpatient therapy with oral Cipro and Flagyl. He seemed to resolve on their own. He has had a sigmoidoscopy which revealed some stricturing around 30-50 cm from the anal verge. A full colonoscopy was unable to be performed because of these findings.  About 3 weeks ago the patient developed left lower quadrant abdominal pain. He was having frequent small diarrhea-like bowel movements. He then noticed he noticed he had blood in his urine. He went to see his primary care physician as is concerned him. He was placed on oral Cipro and Flagyl. He began improving some but began having more blood in his urine. He called his physician  who referred him to the emergency department. We evaluated him initially at that time approximately 3 weeks ago. Upon arrival he was found to have a normal white blood cell count with no fever. He did have a CT scan which revealed changes consistent with diverticulitis. There was possibly a small mesenteric phlegmon. 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 The patient was started on oral Cipro and Flagyl at that time. Since that time he has been doing reasonably well. He still notices occasional fecal matter and air in his urine. No fever or significant abdominal pain. He is eating okay. He is back at work. He is somewhat anxious and not sleeping well due to concerns about his illness.  Past Medical History  Diagnosis Date  . Diverticulitis    Past Surgical History  Procedure Laterality Date  . Appendectomy    . Hernia repair     Current Outpatient Prescriptions  Medication Sig Dispense Refill  . cetirizine (ZYRTEC) 10 MG tablet Take 10 mg by mouth daily.      [name] CIALIS 5 MG tablet       . ciprofloxacin (CIPRO) 500  MG tablet Take 500 mg by mouth 2 (two) times daily. Starting 05/07/13 for 10 days      . hyoscyamine (LEVSIN SL) 0.125 MG SL tablet       . metroNIDAZOLE (FLAGYL) 500 MG tablet Take 500 mg by mouth 3 (three) times daily. Starting 05/07/13 for 7 days      . nabumetone (RELAFEN) 750 MG tablet       . ondansetron (ZOFRAN ODT) 8 MG disintegrating tablet Take 1 tablet (8 mg total) by mouth every 8 (eight) hours as needed for nausea.  20 tablet  0   No current facility-administered medications for this visit.   No Known Allergies History  Substance Use Topics  . Smoking status: Former Smoker    Quit date: 06/03/2008  . Smokeless tobacco: Never Used  . Alcohol Use: Yes   Exam: BP 122/74  Pulse 88  Temp(Src) 97 F (36.1 C) (Temporal)  Resp 14  Ht 6' (1.829 m)  Wt 157 lb 6.4 oz (71.396 kg)  BMI 21.34 kg/m2 General: Thin Caucasian male who does not appear ill Skin: No rash or infection HEENT: No palpable masses. Sclera nonicteric Lungs: Clear good breath sounds bilaterally Cardiac: Regular rate and rhythm no murmurs. No edema Abdomen: Nondistended. Soft and nontender. I cannot feel any masses particularly in the left lower quadrant. Extremities: No edema or  Joint swelling or deformity Neurologic: Slightly anxious. Alert and oriented. Gait normal.   Assessment  and plan: Long history of recurrent diverticulitis of the sigmoid colon and some degree of stricture previously. 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 He clinically is doing well on antibiotics. He obviously is at risk for infection or further complications from his diverticulitis without surgical treatment. I recommended proceeding with sigmoid colectomy and repair of his colovesical fistula. We discussed the indications and nature of the surgery. We discussed alternatives of observation which I do not feel is appropriate. We discussed the risks of the surgery in detail including anesthetic complications, bleeding, infection, leakage from  the anastomosis and possible need for temporary ostomy. He was given literature regarding the procedure. All of his and his wife's questions were answered. We will plan to proceed with open sigmoid colectomy and takedown of colovesical fistula and bladder repair name admission. He was given a mechanical and antibiotic bowel prep.

## 2013-06-30 ENCOUNTER — Encounter (HOSPITAL_COMMUNITY): Payer: Self-pay | Admitting: Pharmacy Technician

## 2013-07-01 ENCOUNTER — Other Ambulatory Visit (HOSPITAL_COMMUNITY): Payer: Self-pay | Admitting: *Deleted

## 2013-07-01 ENCOUNTER — Encounter (HOSPITAL_COMMUNITY): Payer: Self-pay

## 2013-07-01 LAB — CBC
HCT: 42.7 % (ref 39.0–52.0)
Hemoglobin: 14.6 g/dL (ref 13.0–17.0)
MCH: 29.7 pg (ref 26.0–34.0)
MCHC: 34.2 g/dL (ref 30.0–36.0)
RBC: 4.92 MIL/uL (ref 4.22–5.81)

## 2013-07-01 LAB — BASIC METABOLIC PANEL
CO2: 26 mEq/L (ref 19–32)
Chloride: 100 mEq/L (ref 96–112)
GFR calc non Af Amer: >90 mL/min (ref >90)
Glucose, Bld: 90 mg/dL (ref 70–99)
Potassium: 4.7 mEq/L (ref 3.5–5.1)
Sodium: 137 mEq/L (ref 135–145)

## 2013-07-01 LAB — ABO/RH: ABO/RH(D): A NEG

## 2013-07-01 NOTE — Progress Notes (Signed)
07/01/13 1422  OBSTRUCTIVE SLEEP APNEA  Have you ever been diagnosed with sleep apnea through a sleep study? No  Do you snore loudly (loud enough to be heard through closed doors)?  1  Do you often feel tired, fatigued, or sleepy during the daytime? 1  Has anyone observed you stop breathing during your sleep? 0  Do you have, or are you being treated for high blood pressure? 0  BMI more than 35 kg/m2? 0  Age over 65 years old? 1  Neck circumference greater than 40 cm/18 inches? 0  Gender: 1  Obstructive Sleep Apnea Score 4  Score 4 or greater  Results sent to PCP

## 2013-07-01 NOTE — Progress Notes (Signed)
I spoke with Dr. Johna Sheriff concerning EKG with A Fib - he will talk with pt per phone concerning cardiac evaluation.

## 2013-07-01 NOTE — Patient Instructions (Addendum)
Beckem Tomberlin Rayman  07/01/2013                           YOUR PROCEDURE IS SCHEDULED ON: 07/06/13               PLEASE REPORT TO SHORT STAY CENTER AT : 11:15 AM               CALL THIS NUMBER IF ANY PROBLEMS THE DAY OF SURGERY :               [phone]                      REMEMBER:   Do not eat food or drink liquids AFTER MIDNIGHT  May have clear liquids UNTIL 6 HOURS BEFORE SURGERY (7:00 AM)  Clear liquids include soda, tea, black coffee, apple or grape juice, broth.  Take these medicines the morning of surgery with A SIP OF WATER:  none   Do not wear jewelry, make-up   Do not wear lotions, powders, or perfumes.   Do not shave legs or underarms 12 hrs. before surgery (men may shave face)  Do not bring valuables to the hospital.  Contacts, dentures or bridgework may not be worn into surgery.  Leave suitcase in the car. After surgery it may be brought to your room.  For patients admitted to the hospital more than one night, checkout time is 11:00                          The day of discharge.   Patients discharged the day of surgery will not be allowed to drive home                             If going home same day of surgery, must have someone stay with you first                           24 hrs at home and arrange for some one to drive you home from hospital.    Special Instructions:   Please read over the following fact sheets that you were given:               1. FOLLOW BOWEL PREP                      2. [place] PREPARING FOR SURGERY SHEET               3. DISCONTINUE ASPIRIN / HERBAL MEDS / IBUPROFEN/ALEVE/MOTRIN/ADVIL 7 DAYS PREOP                                                X_____________________________________________________________________        Failure to follow these instructions may result in cancellation of your surgery

## 2013-07-04 ENCOUNTER — Encounter: Payer: Self-pay | Admitting: Internal Medicine

## 2013-07-04 ENCOUNTER — Telehealth: Payer: Self-pay | Admitting: *Deleted

## 2013-07-04 ENCOUNTER — Telehealth: Payer: Self-pay | Admitting: Internal Medicine

## 2013-07-04 ENCOUNTER — Ambulatory Visit (HOSPITAL_BASED_OUTPATIENT_CLINIC_OR_DEPARTMENT_OTHER): Payer: BC Managed Care – PPO | Admitting: Radiology

## 2013-07-04 ENCOUNTER — Ambulatory Visit (INDEPENDENT_AMBULATORY_CARE_PROVIDER_SITE_OTHER): Payer: BC Managed Care – PPO | Admitting: Internal Medicine

## 2013-07-04 ENCOUNTER — Encounter (HOSPITAL_COMMUNITY)
Admission: RE | Admit: 2013-07-04 | Discharge: 2013-07-04 | Disposition: A | Discharge status: Home / Self Care | Payer: BC Managed Care – PPO | Source: Ambulatory Visit | Attending: General Surgery | Admitting: General Surgery

## 2013-07-04 VITALS — BP 124/84 | HR 73 | Ht 71.5 in | Wt 160.0 lb

## 2013-07-04 DIAGNOSIS — N321 Vesicointestinal fistula: Secondary | ICD-10-CM

## 2013-07-04 DIAGNOSIS — I4891 Unspecified atrial fibrillation: Secondary | ICD-10-CM

## 2013-07-04 DIAGNOSIS — Z01810 Encounter for preprocedural cardiovascular examination: Secondary | ICD-10-CM | POA: Insufficient documentation

## 2013-07-04 DIAGNOSIS — Z01818 Encounter for other preprocedural examination: Secondary | ICD-10-CM | POA: Insufficient documentation

## 2013-07-04 DIAGNOSIS — I4819 Other persistent atrial fibrillation: Secondary | ICD-10-CM | POA: Insufficient documentation

## 2013-07-04 DIAGNOSIS — Z01812 Encounter for preprocedural laboratory examination: Secondary | ICD-10-CM | POA: Insufficient documentation

## 2013-07-04 HISTORY — DX: Headache: R51

## 2013-07-04 HISTORY — DX: Cardiac murmur, unspecified: R01.1

## 2013-07-04 HISTORY — DX: Vesicointestinal fistula: N32.1

## 2013-07-04 HISTORY — DX: Anxiety disorder, unspecified: F41.9

## 2013-07-04 LAB — TSH: TSH: 2.89 u[IU]/mL (ref 0.35–5.50)

## 2013-07-04 MED ORDER — ASPIRIN EC 81 MG PO TBEC: 81.0000 mg | DELAYED_RELEASE_TABLET | Freq: Every day | ORAL | Status: DC

## 2013-07-04 NOTE — Patient Instructions (Signed)
Your physician has recommended you make the following change in your medication:  1) Start Aspirin 81 mg daily - after surgery complete  Your physician recommends that you return for lab work today: TSH, T4  Your physician recommends that you schedule a follow-up appointment in: 3-4 weeks with Norma Fredrickson, NP.

## 2013-07-04 NOTE — Progress Notes (Signed)
Primary Care Physician: Cassell Smiles., MD  Nicholas Sutton is a 67 y.o. male with a h/o diverticulitis and erectile dysfunction.  He is pending partial colectomy on Wednesday of this week for his diverticulitis.  He was seen for pre-op visit on Friday and was found to be in atrial fibrillation.  He is therefore referred today for further evaluation. He is asymptomatic with his afib and its duration is unknown.  He has had a murmur for "years" but is otherwise without any cardiac concerns.  He exercises regularly without limitation.  Today, he denies symptoms of palpitations, chest pain, shortness of breath, orthopnea, PND, lower extremity edema, dizziness, presyncope, syncope, or neurologic sequela. The patient is tolerating medications without difficulties and is otherwise without complaint today.   Past Medical History  Diagnosis Date  . Diverticulitis   . Heart murmur     has had no problems   . Headache(784.0)     x 3 - 4 per year  . Fistula, intestinovesical   . Anxiety    Past Surgical History  Procedure Laterality Date  . Appendectomy    . Hernia repair    . Gum surgery      Current Outpatient Prescriptions  Medication Sig Dispense Refill  . cetirizine (ZYRTEC) 10 MG tablet Take 10 mg by mouth daily as needed for allergies.       . CIALIS 5 MG tablet Take 5 mg by mouth daily as needed for erectile dysfunction.       . hyoscyamine (LEVSIN SL) 0.125 MG SL tablet Take 0.125 mg by mouth every 4 (four) hours as needed for cramping.       . loperamide (IMODIUM A-D) 2 MG tablet Take 2 mg by mouth 4 (four) times daily as needed for diarrhea or loose stools.      . ondansetron (ZOFRAN-ODT) 8 MG disintegrating tablet Take 8 mg by mouth every 8 (eight) hours as needed for nausea.       No current facility-administered medications for this visit.    No Known Allergies  History   Social History  . Marital Status: Divorced    Spouse Name: N/A    Number of Children: N/A  .  Years of Education: N/A   Occupational History  . Not on file.   Social History Main Topics  . Smoking status: Former Smoker    Quit date: 06/03/2008  . Smokeless tobacco: Never Used  . Alcohol Use: Yes     Comment: rare  . Drug Use: No  . Sexual Activity: Not on file   Other Topics Concern  . Not on file   Social History Narrative  . No narrative on file    Family History  Problem Relation Age of Onset  . Hypertension      ROS- All systems are reviewed and negative except as per the HPI above  Physical Exam: Filed Vitals:   07/04/13 0932  BP: 124/84  Pulse: 73  Height: 5' 11.5" (1.816 m)  Weight: 160 lb (72.576 kg)    GEN- The patient is well appearing, alert and oriented x 3 today.   Head- normocephalic, atraumatic Eyes-  Sclera clear, conjunctiva pink Ears- hearing intact Oropharynx- clear Neck- supple, no JVP Lymph- no cervical lymphadenopathy Lungs- Clear to ausculation bilaterally, normal work of breathing Heart- irregular rate and rhythm, 2/6 SEM LUSB (early peaking) GI- soft, NT, ND, + BS Extremities- no clubbing, cyanosis, or edema MS- no significant deformity or atrophy Skin- no rash or  lesion Psych- euthymic mood, full affect Neuro- strength and sensation are intact  EKG 07/01/13- atrial fibrillation rate 108 bpm, narrow QRS with nonspecific ST/T changes  Echo today reveals EF 55-60% with mild aortic stenosis  Assessment and Plan: 1.  Atrial fibrillation  The patient is asymptomatic with his atrial fibrillation.  I suspect that he has had afib for some time.   His recent BMET and CBC were normal.  His Echo today reveals only mild AS but no other structural changes of concern.  His CHADS2VASC score is only 1 (age of 57).  Per guidelines, I would favor only ASA 81mg  daily for stroke prevention.  This can be started once cleared but surgery after his upcoming procedure. I would recommend metoprolol IV perioperatively.  He will return to see Norma Fredrickson in 3-4 weeks to further assess his heart rates (a 24 hour Holter would be reasonable at that time).  I will check TFTs today.  2. peroperative CV assessment He is low risk for his procedure from a CV standpoint.  No further CV  Testing is required at this time. Proceed with surgery if medically indicated.  I would recommend IV lopressor perioperatively for his afib.  Return to see Norma Fredrickson in 3-4 weeks as above

## 2013-07-04 NOTE — Telephone Encounter (Signed)
New Message  Pt called states that he received a call from [facility] indicating that they see that an Echo cardiogram was completed but they have yet to receive information or a clearance on how to move forward with the surgery. Pt also request lab results for his thyroid.///

## 2013-07-04 NOTE — Telephone Encounter (Signed)
Patient requesting information regarding Dr. Jenel Lucks instructions/clearance for patient to have surgery. Patient states this information needs to be completed prior to 5:00 pm today. Informed him that Dr. Johney Frame was not in office today and that it was already 3:40 pm, however attempt will be made to get appropriate information where it needs to be as soon as possible.  Patient also requesting thyroid test results that were drawn today. Advised patient that Dr. Johney Frame had not reviewed results, as of yet, however results read to patient (both TSH and Free T4 WNL). Advised patient we would contact Dr. Jamse Mead office at this time to see if we could provide any needed information and that Dr. Johney Frame will be messaged requesting needed information regarding special instructions/clearance/anesthesia instructions.  Patient verbalized understanding and appreciation of assistance. Patient verbalized permission for office to contact Dr. Jamse Mead office to complete clearance process.

## 2013-07-04 NOTE — Progress Notes (Signed)
Echocardiogram performed.  

## 2013-07-04 NOTE — Telephone Encounter (Signed)
Called patient to reassure him that Dr. Johney Frame had already placed surgical medication recommendations in office note from today. Also informed patient that office had reached out to [facility], at [facility], and left a VM regarding Dr. Jenel Lucks recommendations and to call office back for further details. Patient verbalized appreciation for follow through and call back to let him know status.

## 2013-07-06 ENCOUNTER — Ambulatory Visit (HOSPITAL_COMMUNITY): Payer: BC Managed Care – PPO | Admitting: Anesthesiology

## 2013-07-06 ENCOUNTER — Encounter (HOSPITAL_COMMUNITY): Payer: Self-pay | Admitting: *Deleted

## 2013-07-06 ENCOUNTER — Encounter (HOSPITAL_COMMUNITY)
Admission: RE | Disposition: A | Discharge status: Home / Self Care | Payer: Self-pay | Source: Ambulatory Visit | Attending: General Surgery

## 2013-07-06 ENCOUNTER — Inpatient Hospital Stay (HOSPITAL_COMMUNITY)
Admission: RE | Admit: 2013-07-06 | Discharge: 2013-07-11 | DRG: 330 | Disposition: A | Discharge status: Home / Self Care | Payer: BC Managed Care – PPO | Source: Ambulatory Visit | Attending: General Surgery | Admitting: General Surgery

## 2013-07-06 ENCOUNTER — Encounter (HOSPITAL_COMMUNITY): Payer: BC Managed Care – PPO | Admitting: Anesthesiology

## 2013-07-06 DIAGNOSIS — Z01810 Encounter for preprocedural cardiovascular examination: Secondary | ICD-10-CM

## 2013-07-06 DIAGNOSIS — I959 Hypotension, unspecified: Secondary | ICD-10-CM | POA: Diagnosis not present

## 2013-07-06 DIAGNOSIS — R319 Hematuria, unspecified: Secondary | ICD-10-CM | POA: Diagnosis present

## 2013-07-06 DIAGNOSIS — E860 Dehydration: Secondary | ICD-10-CM | POA: Diagnosis not present

## 2013-07-06 DIAGNOSIS — F411 Generalized anxiety disorder: Secondary | ICD-10-CM | POA: Diagnosis present

## 2013-07-06 DIAGNOSIS — K5732 Diverticulitis of large intestine without perforation or abscess without bleeding: Secondary | ICD-10-CM | POA: Diagnosis present

## 2013-07-06 DIAGNOSIS — N321 Vesicointestinal fistula: Secondary | ICD-10-CM | POA: Diagnosis present

## 2013-07-06 DIAGNOSIS — Z79899 Other long term (current) drug therapy: Secondary | ICD-10-CM | POA: Diagnosis not present

## 2013-07-06 DIAGNOSIS — I4891 Unspecified atrial fibrillation: Secondary | ICD-10-CM | POA: Diagnosis present

## 2013-07-06 DIAGNOSIS — K630 Abscess of intestine: Secondary | ICD-10-CM | POA: Diagnosis present

## 2013-07-06 DIAGNOSIS — R011 Cardiac murmur, unspecified: Secondary | ICD-10-CM | POA: Diagnosis present

## 2013-07-06 DIAGNOSIS — Z87891 Personal history of nicotine dependence: Secondary | ICD-10-CM

## 2013-07-06 DIAGNOSIS — Z01812 Encounter for preprocedural laboratory examination: Secondary | ICD-10-CM

## 2013-07-06 HISTORY — DX: Unspecified atrial fibrillation: I48.91

## 2013-07-06 HISTORY — PX: COLOSTOMY REVISION: SHX5232

## 2013-07-06 HISTORY — PX: FISTULOTOMY: SHX6413

## 2013-07-06 LAB — TYPE AND SCREEN: ABO/RH(D): A NEG

## 2013-07-06 SURGERY — COLECTOMY, SIGMOID, OPEN
Anesthesia: General | Site: Abdomen

## 2013-07-06 MED ORDER — DIPHENHYDRAMINE HCL 50 MG/ML IJ SOLN: 12.5000 mg | Freq: Four times a day (QID) | INTRAMUSCULAR | Status: DC | PRN

## 2013-07-06 MED ORDER — METOPROLOL TARTRATE 1 MG/ML IV SOLN
5.0000 mg | Freq: Four times a day (QID) | INTRAVENOUS | Status: DC
  Administered 2013-07-06 (×2): 5 mg via INTRAVENOUS
  Filled 2013-07-06 (×6): qty 5

## 2013-07-06 MED ORDER — GLYCOPYRROLATE 0.2 MG/ML IJ SOLN
INTRAMUSCULAR | Status: DC | PRN
  Administered 2013-07-06: 0.6 mg via INTRAVENOUS

## 2013-07-06 MED ORDER — ONDANSETRON HCL 4 MG PO TABS: 4.0000 mg | ORAL_TABLET | Freq: Four times a day (QID) | ORAL | Status: DC | PRN

## 2013-07-06 MED ORDER — ERYTHROMYCIN BASE 250 MG PO TABS
1000.0000 mg | ORAL_TABLET | ORAL | Status: DC
  Filled 2013-07-06: qty 4

## 2013-07-06 MED ORDER — PROPOFOL 10 MG/ML IV BOLUS
INTRAVENOUS | Status: AC
  Filled 2013-07-06: qty 20

## 2013-07-06 MED ORDER — HYDROMORPHONE 0.3 MG/ML IV SOLN
INTRAVENOUS | Status: AC
  Administered 2013-07-07: 06:00:00
  Filled 2013-07-06: qty 25

## 2013-07-06 MED ORDER — LACTATED RINGERS IV SOLN
INTRAVENOUS | Status: DC | PRN
  Administered 2013-07-06 (×4): via INTRAVENOUS

## 2013-07-06 MED ORDER — LIDOCAINE HCL (CARDIAC) 20 MG/ML IV SOLN
INTRAVENOUS | Status: DC | PRN
  Administered 2013-07-06: 80 mg via INTRAVENOUS

## 2013-07-06 MED ORDER — FENTANYL CITRATE 0.05 MG/ML IJ SOLN
INTRAMUSCULAR | Status: AC
  Filled 2013-07-06: qty 5

## 2013-07-06 MED ORDER — NALOXONE HCL 0.4 MG/ML IJ SOLN: 0.4000 mg | INTRAMUSCULAR | Status: DC | PRN

## 2013-07-06 MED ORDER — ONDANSETRON HCL 4 MG/2ML IJ SOLN
4.0000 mg | Freq: Four times a day (QID) | INTRAMUSCULAR | Status: DC | PRN
  Filled 2013-07-06: qty 2

## 2013-07-06 MED ORDER — MIDAZOLAM HCL 2 MG/2ML IJ SOLN
INTRAMUSCULAR | Status: AC
  Filled 2013-07-06: qty 2

## 2013-07-06 MED ORDER — PHENYLEPHRINE HCL 10 MG/ML IJ SOLN
INTRAMUSCULAR | Status: DC | PRN
  Administered 2013-07-06 (×2): 40 ug via INTRAVENOUS
  Administered 2013-07-06 (×2): 60 ug via INTRAVENOUS
  Administered 2013-07-06: 40 ug via INTRAVENOUS
  Administered 2013-07-06: 60 ug via INTRAVENOUS
  Administered 2013-07-06: 40 ug via INTRAVENOUS
  Administered 2013-07-06: 80 ug via INTRAVENOUS
  Administered 2013-07-06: 40 ug via INTRAVENOUS

## 2013-07-06 MED ORDER — ROCURONIUM BROMIDE 100 MG/10ML IV SOLN
INTRAVENOUS | Status: AC
  Filled 2013-07-06: qty 1

## 2013-07-06 MED ORDER — NEOMYCIN SULFATE 500 MG PO TABS
1000.0000 mg | ORAL_TABLET | ORAL | Status: DC
  Filled 2013-07-06: qty 2

## 2013-07-06 MED ORDER — DEXTROSE 5 % IV SOLN
2.0000 g | INTRAVENOUS | Status: AC
  Administered 2013-07-06: 2 g via INTRAVENOUS
  Filled 2013-07-06: qty 2

## 2013-07-06 MED ORDER — HYDROMORPHONE HCL PF 1 MG/ML IJ SOLN
INTRAMUSCULAR | Status: AC
  Filled 2013-07-06: qty 1

## 2013-07-06 MED ORDER — KETAMINE HCL 50 MG/ML IJ SOLN
INTRAMUSCULAR | Status: DC | PRN
  Administered 2013-07-06 (×2): 10 mg via INTRAMUSCULAR
  Administered 2013-07-06: 20 mg via INTRAMUSCULAR
  Administered 2013-07-06: 10 mg via INTRAMUSCULAR

## 2013-07-06 MED ORDER — ALVIMOPAN 12 MG PO CAPS
12.0000 mg | ORAL_CAPSULE | Freq: Two times a day (BID) | ORAL | Status: DC
  Administered 2013-07-07 – 2013-07-10 (×7): 12 mg via ORAL
  Filled 2013-07-06 (×8): qty 1

## 2013-07-06 MED ORDER — ALVIMOPAN 12 MG PO CAPS
12.0000 mg | ORAL_CAPSULE | Freq: Once | ORAL | Status: AC
  Administered 2013-07-06: 12 mg via ORAL
  Filled 2013-07-06: qty 1

## 2013-07-06 MED ORDER — STERILE WATER FOR IRRIGATION IR SOLN
Status: DC | PRN
  Administered 2013-07-06: 15:00:00 via INTRAVESICAL

## 2013-07-06 MED ORDER — ROCURONIUM BROMIDE 100 MG/10ML IV SOLN
INTRAVENOUS | Status: DC | PRN
  Administered 2013-07-06 (×2): 10 mg via INTRAVENOUS
  Administered 2013-07-06: 20 mg via INTRAVENOUS
  Administered 2013-07-06: 50 mg via INTRAVENOUS
  Administered 2013-07-06: 10 mg via INTRAVENOUS

## 2013-07-06 MED ORDER — 0.9 % SODIUM CHLORIDE (POUR BTL) OPTIME
TOPICAL | Status: DC | PRN
  Administered 2013-07-06: 4000 mL

## 2013-07-06 MED ORDER — HEPARIN SODIUM (PORCINE) 5000 UNIT/ML IJ SOLN
5000.0000 [IU] | Freq: Three times a day (TID) | INTRAMUSCULAR | Status: DC
  Administered 2013-07-06 – 2013-07-11 (×14): 5000 [IU] via SUBCUTANEOUS
  Filled 2013-07-06 (×17): qty 1

## 2013-07-06 MED ORDER — SODIUM CHLORIDE 0.9 % IJ SOLN
INTRAMUSCULAR | Status: AC
  Filled 2013-07-06: qty 10

## 2013-07-06 MED ORDER — GLYCOPYRROLATE 0.2 MG/ML IJ SOLN
INTRAMUSCULAR | Status: AC
  Filled 2013-07-06: qty 3

## 2013-07-06 MED ORDER — OXYCODONE HCL 5 MG/5ML PO SOLN
5.0000 mg | Freq: Once | ORAL | Status: DC | PRN
  Filled 2013-07-06: qty 5

## 2013-07-06 MED ORDER — FENTANYL CITRATE 0.05 MG/ML IJ SOLN
INTRAMUSCULAR | Status: DC | PRN
  Administered 2013-07-06: 100 ug via INTRAVENOUS
  Administered 2013-07-06 (×2): 50 ug via INTRAVENOUS

## 2013-07-06 MED ORDER — PHENYLEPHRINE 40 MCG/ML (10ML) SYRINGE FOR IV PUSH (FOR BLOOD PRESSURE SUPPORT)
PREFILLED_SYRINGE | INTRAVENOUS | Status: AC
  Filled 2013-07-06: qty 20

## 2013-07-06 MED ORDER — PROPOFOL 10 MG/ML IV BOLUS
INTRAVENOUS | Status: DC | PRN
  Administered 2013-07-06: 150 mg via INTRAVENOUS

## 2013-07-06 MED ORDER — NEOSTIGMINE METHYLSULFATE 1 MG/ML IJ SOLN
INTRAMUSCULAR | Status: DC | PRN
  Administered 2013-07-06: 4 mg via INTRAVENOUS

## 2013-07-06 MED ORDER — ONDANSETRON HCL 4 MG/2ML IJ SOLN
INTRAMUSCULAR | Status: DC | PRN
  Administered 2013-07-06: 4 mg via INTRAVENOUS

## 2013-07-06 MED ORDER — ASPIRIN EC 81 MG PO TBEC
81.0000 mg | DELAYED_RELEASE_TABLET | Freq: Every day | ORAL | Status: DC
  Administered 2013-07-06 – 2013-07-11 (×6): 81 mg via ORAL
  Filled 2013-07-06 (×6): qty 1

## 2013-07-06 MED ORDER — PROMETHAZINE HCL 25 MG/ML IJ SOLN: 6.2500 mg | INTRAMUSCULAR | Status: DC | PRN

## 2013-07-06 MED ORDER — KCL IN DEXTROSE-NACL 20-5-0.9 MEQ/L-%-% IV SOLN
INTRAVENOUS | Status: DC
  Administered 2013-07-07 – 2013-07-10 (×10): via INTRAVENOUS
  Filled 2013-07-06 (×11): qty 1000

## 2013-07-06 MED ORDER — MEPERIDINE HCL 50 MG/ML IJ SOLN: 6.2500 mg | INTRAMUSCULAR | Status: DC | PRN

## 2013-07-06 MED ORDER — DIPHENHYDRAMINE HCL 12.5 MG/5ML PO ELIX: 12.5000 mg | ORAL_SOLUTION | Freq: Four times a day (QID) | ORAL | Status: DC | PRN

## 2013-07-06 MED ORDER — ONDANSETRON HCL 4 MG/2ML IJ SOLN
4.0000 mg | Freq: Four times a day (QID) | INTRAMUSCULAR | Status: DC | PRN
  Administered 2013-07-07: 15:00:00 4 mg via INTRAVENOUS

## 2013-07-06 MED ORDER — KCL IN DEXTROSE-NACL 20-5-0.45 MEQ/L-%-% IV SOLN
INTRAVENOUS | Status: AC
  Administered 2013-07-06: 1000 mL
  Filled 2013-07-06: qty 1000

## 2013-07-06 MED ORDER — OXYCODONE HCL 5 MG PO TABS: 5.0000 mg | ORAL_TABLET | Freq: Once | ORAL | Status: DC | PRN

## 2013-07-06 MED ORDER — CEFOTETAN DISODIUM-DEXTROSE 2-2.08 GM-% IV SOLR
INTRAVENOUS | Status: AC
  Filled 2013-07-06: qty 50

## 2013-07-06 MED ORDER — HYDROMORPHONE HCL PF 1 MG/ML IJ SOLN
0.2500 mg | INTRAMUSCULAR | Status: DC | PRN
  Administered 2013-07-06 (×2): 0.5 mg via INTRAVENOUS

## 2013-07-06 MED ORDER — HYDROMORPHONE 0.3 MG/ML IV SOLN
INTRAVENOUS | Status: DC
  Administered 2013-07-06: 2.1 mg via INTRAVENOUS
  Administered 2013-07-06: 16:00:00 via INTRAVENOUS
  Administered 2013-07-06: 1.8 mg via INTRAVENOUS
  Administered 2013-07-07 (×2): 1.2 mg via INTRAVENOUS
  Administered 2013-07-07: 1.5 mg via INTRAVENOUS
  Administered 2013-07-07: 2.1 mg via INTRAVENOUS
  Administered 2013-07-07 – 2013-07-08 (×2): 1.2 mg via INTRAVENOUS
  Administered 2013-07-08: 0.6 mg via INTRAVENOUS
  Administered 2013-07-08: 0.9 mg via INTRAVENOUS
  Administered 2013-07-09: 0.6 mg via INTRAVENOUS
  Administered 2013-07-09: 10:00:00 via INTRAVENOUS
  Administered 2013-07-09: 0.6 mg via INTRAVENOUS
  Administered 2013-07-09: 2.5 mg via INTRAVENOUS
  Administered 2013-07-09: 0.6 mg via INTRAVENOUS
  Administered 2013-07-09: 2 mg via INTRAVENOUS
  Administered 2013-07-10: 1.5 mg via INTRAVENOUS
  Administered 2013-07-10: 2.3 mg via INTRAVENOUS
  Administered 2013-07-10: 1.5 mg via INTRAVENOUS
  Administered 2013-07-10: 0.3 mg via INTRAVENOUS
  Administered 2013-07-10: 0.9 mg via INTRAVENOUS
  Filled 2013-07-06 (×4): qty 25

## 2013-07-06 MED ORDER — MIDAZOLAM HCL 5 MG/5ML IJ SOLN
INTRAMUSCULAR | Status: DC | PRN
  Administered 2013-07-06: 2 mg via INTRAVENOUS

## 2013-07-06 MED ORDER — LIDOCAINE HCL (CARDIAC) 20 MG/ML IV SOLN
INTRAVENOUS | Status: AC
  Filled 2013-07-06: qty 5

## 2013-07-06 MED ORDER — SODIUM CHLORIDE 0.9 % IJ SOLN: 9.0000 mL | INTRAMUSCULAR | Status: DC | PRN

## 2013-07-06 MED ORDER — NEOSTIGMINE METHYLSULFATE 1 MG/ML IJ SOLN
INTRAMUSCULAR | Status: AC
Start: 2013-07-06 — End: 2013-07-06
  Filled 2013-07-06: qty 10

## 2013-07-06 MED ORDER — ONDANSETRON HCL 4 MG/2ML IJ SOLN
INTRAMUSCULAR | Status: AC
  Filled 2013-07-06: qty 2

## 2013-07-06 MED ORDER — KETAMINE HCL 50 MG/ML IJ SOLN
INTRAMUSCULAR | Status: AC
  Filled 2013-07-06: qty 10

## 2013-07-06 MED ORDER — METHYLENE BLUE 1 % INJ SOLN
INTRAMUSCULAR | Status: AC
  Filled 2013-07-06: qty 10

## 2013-07-06 MED ORDER — PEG 3350-KCL-NA BICARB-NACL 420 G PO SOLR
4000.0000 mL | Freq: Once | ORAL | Status: DC
  Filled 2013-07-06: qty 4000

## 2013-07-06 SURGICAL SUPPLY — 72 items
APPLICATOR COTTON TIP 6IN STRL (MISCELLANEOUS) ×4 IMPLANT
BLADE EXTENDED COATED 6.5IN (ELECTRODE) ×1 IMPLANT
BLADE HEX COATED 2.75 (ELECTRODE) ×4 IMPLANT
BLADE SURG SZ10 CARB STEEL (BLADE) ×3 IMPLANT
CANISTER SUCTION 2500CC (MISCELLANEOUS) ×2 IMPLANT
CELLS DAT CNTRL 66122 CELL SVR (MISCELLANEOUS) ×2 IMPLANT
CLIP TI LARGE 6 (CLIP) IMPLANT
COVER MAYO STAND STRL (DRAPES) ×4 IMPLANT
DRAPE LAPAROSCOPIC ABDOMINAL (DRAPES) ×3 IMPLANT
DRAPE LG THREE QUARTER DISP (DRAPES) ×1 IMPLANT
DRAPE UTILITY XL STRL (DRAPES) ×2 IMPLANT
DRAPE WARM FLUID 44X44 (DRAPE) ×3 IMPLANT
DRSG OPSITE POSTOP 4X8 (GAUZE/BANDAGES/DRESSINGS) ×1 IMPLANT
ELECT REM PT RETURN 9FT ADLT (ELECTROSURGICAL) ×3
ELECTRODE REM PT RTRN 9FT ADLT (ELECTROSURGICAL) ×2 IMPLANT
GLOVE BIOGEL PI IND STRL 7.0 (GLOVE) ×2 IMPLANT
GLOVE BIOGEL PI IND STRL 7.5 (GLOVE) ×2 IMPLANT
GLOVE BIOGEL PI INDICATOR 7.0 (GLOVE) ×2
GLOVE BIOGEL PI INDICATOR 7.5 (GLOVE) ×1
GLOVE SS BIOGEL STRL SZ 7.5 (GLOVE) ×4 IMPLANT
GLOVE SUPERSENSE BIOGEL SZ 7.5 (GLOVE) ×2
GOWN PREVENTION PLUS LG XLONG (DISPOSABLE) ×3 IMPLANT
GOWN PREVENTION PLUS XXLARGE (GOWN DISPOSABLE) ×2 IMPLANT
GOWN SPEC L3 XXLG W/TWL (GOWN DISPOSABLE) ×2 IMPLANT
GOWN STRL REIN XL XLG (GOWN DISPOSABLE) ×8 IMPLANT
KIT BASIN OR (CUSTOM PROCEDURE TRAY) ×4 IMPLANT
LEGGING LITHOTOMY PAIR STRL (DRAPES) ×1 IMPLANT
LIGASURE IMPACT 36 18CM CVD LR (INSTRUMENTS) IMPLANT
NDL SAFETY ECLIPSE 18X1.5 (NEEDLE) IMPLANT
NEEDLE HYPO 18GX1.5 SHARP (NEEDLE) ×3
NS IRRIG 1000ML POUR BTL (IV SOLUTION) ×7 IMPLANT
PACK GENERAL/GYN (CUSTOM PROCEDURE TRAY) ×3 IMPLANT
PENCIL BUTTON HOLSTER BLD 10FT (ELECTRODE) ×1 IMPLANT
RELOAD PROXIMATE 75MM BLUE (ENDOMECHANICALS) ×3 IMPLANT
RELOAD STAPLE 75 3.8 BLU REG (ENDOMECHANICALS) IMPLANT
RETRACTOR WND ALEXIS 18 MED (MISCELLANEOUS) IMPLANT
RETRACTOR WND ALEXIS 25 LRG (MISCELLANEOUS) IMPLANT
RTRCTR WOUND ALEXIS 18CM MED (MISCELLANEOUS) ×3
RTRCTR WOUND ALEXIS 25CM LRG (MISCELLANEOUS) ×3
SCALPEL HARMONIC ACE (MISCELLANEOUS) IMPLANT
SEALER TISSUE G2 CVD JAW 35 (ENDOMECHANICALS) IMPLANT
SEALER TISSUE G2 CVD JAW 45CM (ENDOMECHANICALS) ×1
SPONGE GAUZE 4X4 12PLY (GAUZE/BANDAGES/DRESSINGS) ×2 IMPLANT
SPONGE LAP 18X18 X RAY DECT (DISPOSABLE) ×1 IMPLANT
STAPLER CIRC CVD 29MM 37CM (STAPLE) ×1 IMPLANT
STAPLER CUT CVD 40MM BLUE (STAPLE) ×1 IMPLANT
STAPLER PROXIMATE 75MM BLUE (STAPLE) ×1 IMPLANT
STAPLER VISISTAT 35W (STAPLE) ×3 IMPLANT
SUCTION POOLE TIP (SUCTIONS) ×3 IMPLANT
SUT NOV 1 T60/GS (SUTURE) IMPLANT
SUT NOVA 1 T20/GS 25DT (SUTURE) IMPLANT
SUT NOVA NAB DX-16 0-1 5-0 T12 (SUTURE) IMPLANT
SUT NOVA T20/GS 25 (SUTURE) IMPLANT
SUT PDS AB 1 CTX 36 (SUTURE) IMPLANT
SUT PDS AB 1 TP1 96 (SUTURE) IMPLANT
SUT PROLENE 2 0 KS (SUTURE) ×1 IMPLANT
SUT SILK 2 0 (SUTURE) ×3
SUT SILK 2 0 SH CR/8 (SUTURE) ×4 IMPLANT
SUT SILK 2 0SH CR/8 30 (SUTURE) IMPLANT
SUT SILK 2-0 18XBRD TIE 12 (SUTURE) ×2 IMPLANT
SUT SILK 2-0 30XBRD TIE 12 (SUTURE) IMPLANT
SUT SILK 3 0 (SUTURE) ×3
SUT SILK 3 0 SH CR/8 (SUTURE) ×3 IMPLANT
SUT SILK 3-0 18XBRD TIE 12 (SUTURE) ×4 IMPLANT
SUT VIC AB 3-0 54XBRD REEL (SUTURE) IMPLANT
SUT VIC AB 3-0 BRD 54 (SUTURE)
SYR 5ML LL (SYRINGE) ×1 IMPLANT
TOWEL OR 17X26 10 PK STRL BLUE (TOWEL DISPOSABLE) ×3 IMPLANT
TRAY FOLEY CATH 14FRSI W/METER (CATHETERS) ×3 IMPLANT
WATER STERILE IRR 1000ML POUR (IV SOLUTION) ×1 IMPLANT
YANKAUER SUCT BULB TIP 10FT TU (MISCELLANEOUS) ×1 IMPLANT
YANKAUER SUCT BULB TIP NO VENT (SUCTIONS) ×2 IMPLANT

## 2013-07-06 NOTE — Transfer of Care (Signed)
Immediate Anesthesia Transfer of Care Note  Patient: Nicholas Sutton  Procedure(s) Performed: Procedure(s): COLON RESECTION SIGMOID (N/A) repair of colovesicle fistula/ FISTULOTOMY (N/A)  Patient Location: PACU  Anesthesia Type:General  Level of Consciousness: awake, alert  and oriented  Airway & Oxygen Therapy: Patient Spontanous Breathing and Patient connected to face mask oxygen  Post-op Assessment: Report given to PACU RN and Post -op Vital signs reviewed and stable  Post vital signs: Reviewed and stable  Complications: No apparent anesthesia complications

## 2013-07-06 NOTE — Anesthesia Preprocedure Evaluation (Addendum)
Anesthesia Evaluation  Patient identified by MRN, date of birth, ID band Patient awake    Reviewed: Allergy & Precautions, H&P , NPO status , Patient's Chart, lab work & pertinent test results  Airway Mallampati: II TM Distance: >3 FB Neck ROM: Full    Dental  (+) Dental Advisory Given and Teeth Intact   Pulmonary former smoker,  breath sounds clear to auscultation        Cardiovascular + dysrhythmias Atrial Fibrillation + Valvular Problems/Murmurs Rhythm:Regular Rate:Normal     Neuro/Psych  Headaches, PSYCHIATRIC DISORDERS Anxiety    GI/Hepatic negative GI ROS, Neg liver ROS,   Endo/Other  negative endocrine ROS  Renal/GU negative Renal ROS     Musculoskeletal negative musculoskeletal ROS (+)   Abdominal   Peds  Hematology negative hematology ROS (+)   Anesthesia Other Findings   Reproductive/Obstetrics                          Anesthesia Physical Anesthesia Plan  ASA: II  Anesthesia Plan: General   Post-op Pain Management:    Induction: Intravenous  Airway Management Planned: Oral ETT  Additional Equipment:   Intra-op Plan:   Post-operative Plan: Extubation in OR  Informed Consent: I have reviewed the patients History and Physical, chart, labs and discussed the procedure including the risks, benefits and alternatives for the proposed anesthesia with the patient or authorized representative who has indicated his/her understanding and acceptance.   Dental advisory given  Plan Discussed with: CRNA  Anesthesia Plan Comments:         Anesthesia Quick Evaluation

## 2013-07-06 NOTE — H&P (Signed)
Chief Complaint: diverticulitis with abscess and colovesical fistula  HPI: This is a 67 year old white male who has had a 10 year history of intermittent episodes of diverticulitis. He has never been admitted for these. He has received outpatient therapy with oral Cipro and Flagyl. He seemed to resolve on their own. He has had a sigmoidoscopy which revealed some stricturing around 30-50 cm from the anal verge. A full colonoscopy was unable to be performed because of these findings.  About 3 weeks ago the patient developed left lower quadrant abdominal pain. He was having frequent small diarrhea-like bowel movements. He then noticed he noticed he had blood in his urine. He went to see his primary care physician as is concerned him. He was placed on oral Cipro and Flagyl. He began improving some but began having more blood in his urine. He called his physician who referred him to the emergency department. We evaluated him initially at that time approximately 8 weeks ago. Upon arrival he was found to have a normal white blood cell count with no fever. He did have a CT scan which revealed changes consistent with diverticulitis. There was possibly a small mesenteric phlegmon. 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 The patient was started on oral Cipro and Flagyl at that time. Since that time he has been doing reasonably well. He still notices occasional fecal matter and air in his urine. No fever or significant abdominal pain. He is eating okay. He is back at work. He is somewhat anxious and not sleeping well due to concerns about his illness. With these findings we have recommended proceeding with sigmoid colectomy and repair of his colovesical fistula and he is admitted electively for this procedure.   Past Medical History  Diagnosis Date  . Diverticulitis   . Heart murmur     has had no problems   . Headache(784.0)     x 3 - 4 per year  . Fistula, intestinovesical   .  Anxiety   . Atrial fibrillation    Past Surgical History  Procedure Laterality Date  . Appendectomy    . Hernia repair    . Gum surgery     Current Facility-Administered Medications  Medication Dose Route Frequency Provider Last Rate Last Dose  . cefoTEtan (CEFOTAN) 2 g in dextrose 5 % 50 mL IVPB  2 g Intravenous On Call to OR Mariella Saa, MD      . neomycin [facility]) tablet 1,000 mg  1,000 mg Oral Custom Mariella Saa, MD       And  . erythromycin (E-MYCIN) tablet 1,000 mg  1,000 mg Oral Custom Mariella Saa, MD      . polyethylene glycol-electrolytes (NuLYTELY/GoLYTELY) solution 4,000 mL  4,000 mL Oral Once Mariella Saa, MD       Facility-Administered Medications Ordered in Other Encounters  Medication Dose Route Frequency Provider Last Rate Last Dose  . lactated ringers infusion    Continuous PRN Florene Route, CRNA       No Known Allergies  .  Smoking status:  Former Smoker     Quit date:  06/03/2008   .  Smokeless tobacco:  Never Used   .  Alcohol Use:  Yes   Exam:   BP 124/97  Pulse 80  Temp(Src) 97.6 F (36.4 C) (Oral)  Resp 18  SpO2 96%  General: Thin Caucasian male who does not appear ill  Skin: No rash or infection  HEENT:  No palpable masses. Sclera nonicteric  Lungs: Clear good breath sounds bilaterally  Cardiac: Regular rate and rhythm no murmurs. No edema  Abdomen: Nondistended. Soft and nontender. I cannot feel any masses particularly in the left lower quadrant.  Extremities: No edema or Joint swelling or deformity  Neurologic: Slightly anxious. Alert and oriented. Gait normal.   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 He clinically is doing well after a course of antibiotics. He obviously is at risk for infection or further complications from his diverticulitis without surgical treatment. I recommended  proceeding with sigmoid colectomy and repair of his colovesical fistula. We discussed the indications and nature of the surgery. We discussed alternatives of observation which I do not feel is appropriate. We discussed the risks of the surgery in detail including anesthetic complications, bleeding, infection, leakage from the anastomosis and possible need for temporary ostomy. He was given literature regarding the procedure. All of his and his wife's questions were answered. We will plan to proceed with open sigmoid colectomy and takedown of colovesical fistula and bladder repair.  He has completed a mechanical and antibiotic bowel prep.  Mariella Saa MD, FACS  07/06/2013, 12:29 PM

## 2013-07-06 NOTE — Anesthesia Postprocedure Evaluation (Signed)
Anesthesia Post Note  Patient: Nicholas Sutton  Procedure(s) Performed: Procedure(s) (LRB): COLON RESECTION SIGMOID (N/A) repair of colovesicle fistula/ FISTULOTOMY (N/A)  Anesthesia type: General  Patient location: PACU  Post pain: Pain level controlled  Post assessment: Post-op Vital signs reviewed  Last Vitals: BP 99/60  Pulse 118  Temp(Src) 36.8 C (Oral)  Resp 9  Ht 5' 11.5" (1.816 m)  Wt 160 lb (72.576 kg)  BMI 22.01 kg/m2  SpO2 100%  Post vital signs: Reviewed  Level of consciousness: sedated  Complications: No apparent anesthesia complications

## 2013-07-06 NOTE — Progress Notes (Signed)
PACU note-----spoke with anesthesiologist Dr. Rica Mast re; pt's continued atrial fib rate 110's-120's; telemetry monitoring ordered

## 2013-07-06 NOTE — Preoperative (Signed)
Beta Blockers   Reason not to administer Beta Blockers:Not Applicable 

## 2013-07-06 NOTE — Op Note (Signed)
Preoperative Diagnosis: sigmoid diverticulitis colo vesicle fistula   Postoprative Diagnosis: sigmoid diverticulitis colo vesicle fistula   Procedure: Procedure(s): COLON RESECTION SIGMOID Takedown of colovesical fistula   Surgeon: Glenna Fellows T   Assistants: Lendon Ka  Anesthesia:  General endotracheal anesthesia  Indications: patient is a 67 year old male with a long history of repeated bouts of sigmoid diverticulitis. Colonoscopy recently showed a stricture of the sigmoid colon between 20 and 30 cm. Shortly thereafter he developed the onset of fecal matter in his urine. CT scan showed a small pericolonic abscess with an apparent fistula to the bladder. He has been treated with antibiotics and is asymptomatic other than continued small amounts of fecal matter in his urine. I recommend proceeding with elective sigmoid colon resection with takedown of his colovesical fistula. We have discussed the procedure and indications and risks detailed extensively elsewhere.  The patient has undergone a mechanical and antibiotic bowel prep at home and is brought to the operating room for this procedure.  Procedure Detail:  Patient is brought to the operating room, placed in the supine position on the operating table and general endotracheal anesthesia induced. He was carefully positioned in lithotomy position. Foley catheter was placed. The abdomen was widely sterilely prepped and draped. He received preoperative IV antibiotics. Patient time out was performed and correct procedure verified. A low midline incision from the pubis to below the umbilicus was used to dissect. At the saphenous tissue and midline fascia. The peritoneum was entered under direct vision. There were dense adhesions of the omentum down into the pelvis along the colon and bladder and peritoneum which were all carefully taken down with cautery and the omentum and small bowel were packed into the upper abdomen. There was a dense  inflammatory mass in the mid sigmoid colon adherent to the left lateral pelvic sidewall in the bladder. Initially I mobilized the more proximal left colon or proximal sigmoid colon dividing the lateral peritoneal attachments and mobilizing the mesentery medially. Using cautery and blunt dissection dense adhesions were taken down between the colon and the bladder peritoneum and lateral pelvic sidewall. Before I worked posteriorly the  Left ureter was identified in normal retroperitoneal soft tissue lateral to the inflammatory process throughout its course. It was carefully protected throughout the dissection. The right ureter was also identified and was well away from the process. Working down into the pelvis I was able to free be more distal colon from less dense inflammatory adhesions down along the lateral and anterior deep pelvis and then working back up along the colon wall with mostly blunt dissection but also some sharp and cautery dissection the inflammatory mass was able to eventually be completely freed from the bladder and lateral pelvic sidewall. As we came along the left side of the bladder just a little posterior to the dome there was a very dense adhesion and we came across a small opening with what appeared to be a small amount of mucus and pus consistent with a small pericolonic abscess. There were very dense fibrous adhesions at the bladder at this point and I suspected this was a fistula although I could not demonstrate a clear opening into the bladder. At this point the sigmoid was completely mobilized and we could see we had quite a lot of soft bowel distal to the inflammatory process. The proximal sigmoid or distal left colon was divided with the GIA stapler. The mesentery of the sigmoid and rectosigmoid was then sequentially divided with the LigaSure device then closed  the bowel wall away from the ureter and pelvic sidewall. The dissection progressed distally until we came to a nice soft area  of the upper rectum without tinea at this point this area bowel was cleaned of mesentery and perirectal fat and divided with the contour stapler blue load and a final small amount of mesentery of the distal rectosigmoid was divided with the LigaSure and the specimen removed. We then insufflated the bladder with about 600 cc of dilute methylene blue the gallbladder was tensely distended and there was no evidence of any leak. The bladder was drained. We elected to perform a EEA stapled anastomosis. The proximal end of the colon was cleared of mesentery and pericolic fat for several centimeters and then clamped with the pursestring device and a 2-0 Prolene pursestring suture placed in the bowel divided sharply at the clamp distal to pursestring and removed. The pursestring was intact. The bowel was healthy and soft and was sized to a 29 mm stapler. The anvil was placed and the pursestring suture secured good healthy bowel circumferentially over the angle. Dr. Maisie Fus then went below and advanced the 29 mm EEA stapler up to the staple line and the spike deployed, anvil attached and device closed excluding any extraneous tissue. The anastomosis was created and 2 intact thick donuts of tissue were confirmed. Dr. Maisie Fus then performed rigid sigmoidoscopy and tensely distended the anastomosis with air. Under saline irrigation I initially saw two brief intermittent tiny stream of air bubbles but could not identify where they came from as they were very brief and with further insufflation I could not identify any further air leakage. We carefully inspected the anastomosis circumferentially and it all appeared intact with good blood supply and under no tension. However I did place circumferentially an outer row of seromuscular mattress sutures inverting the entire anastomosis.  Following that I went below and again perform rigid sigmoidoscopy and with Dr. Maisie Fus inspecting under saline irrigation and again tensely distending  the anastomosis with air there was no evidence of leak. The anastomosis could be visualized and was intact and without bleeding. It was widely patent. We at this point did not feel that there was a significant problem with the anastomosis that would require ileostomy. All gloves gowns instruments were changed and new drapes applied. The abdomen was thoroughly irrigated and hemostasis assured. The viscera returned to anatomic position and omentum was placed over the bladder between the colon and the bladder. The midline wound was closed with looped running #1 PDS begun the uterine incision and tied centrally. The subcutaneous tissue was irrigated and the skin closed with staples. Sponge needle and instrument counts were correct.    Findings: Severe diverticulitis with abscess and apparent colovesical fistula without definite opening in the bladder identified  Estimated Blood Loss:  less than 100 mL         Drains: none  Blood Given: none          Specimens: sigmoid colon        Complications:  * No complications entered in OR log *         Disposition: PACU - hemodynamically stable.         Condition: stable

## 2013-07-07 ENCOUNTER — Encounter (HOSPITAL_COMMUNITY): Payer: Self-pay | Admitting: General Surgery

## 2013-07-07 LAB — BASIC METABOLIC PANEL
BUN: 8 mg/dL (ref 6–23)
Calcium: 7.9 mg/dL — ABNORMAL LOW (ref 8.4–10.5)
Chloride: 104 mEq/L (ref 96–112)
Creatinine, Ser: 0.73 mg/dL (ref 0.50–1.35)
GFR calc Af Amer: >90 mL/min (ref >90)
GFR calc non Af Amer: >90 mL/min (ref >90)
Glucose, Bld: 145 mg/dL — ABNORMAL HIGH (ref 70–99)
Potassium: 4.3 mEq/L (ref 3.5–5.1)

## 2013-07-07 LAB — CBC
HCT: 34.7 % — ABNORMAL LOW (ref 39.0–52.0)
Hemoglobin: 11.8 g/dL — ABNORMAL LOW (ref 13.0–17.0)
MCH: 29.5 pg (ref 26.0–34.0)
MCHC: 34 g/dL (ref 30.0–36.0)
RDW: 15.3 % (ref 11.5–15.5)

## 2013-07-07 LAB — HEMOGLOBIN AND HEMATOCRIT, BLOOD
HCT: 34.2 % — ABNORMAL LOW (ref 39.0–52.0)
Hemoglobin: 11.7 g/dL — ABNORMAL LOW (ref 13.0–17.0)

## 2013-07-07 MED ORDER — MAGNESIUM HYDROXIDE 400 MG/5ML PO SUSP
30.0000 mL | Freq: Two times a day (BID) | ORAL | Status: DC | PRN
  Administered 2013-07-07: 19:00:00 30 mL via ORAL
  Filled 2013-07-07: qty 30

## 2013-07-07 MED ORDER — SODIUM CHLORIDE 0.9 % IV BOLUS (SEPSIS)
1000.0000 mL | Freq: Once | INTRAVENOUS | Status: AC
  Administered 2013-07-07: 1000 mL via INTRAVENOUS

## 2013-07-07 MED ORDER — SODIUM CHLORIDE 0.9 % IV BOLUS (SEPSIS)
500.0000 mL | Freq: Once | INTRAVENOUS | Status: AC
  Administered 2013-07-07: 500 mL via INTRAVENOUS

## 2013-07-07 MED ORDER — PANTOPRAZOLE SODIUM 40 MG PO TBEC
40.0000 mg | DELAYED_RELEASE_TABLET | Freq: Every day | ORAL | Status: DC
  Administered 2013-07-07 – 2013-07-11 (×5): 40 mg via ORAL
  Filled 2013-07-07 (×5): qty 1

## 2013-07-07 NOTE — Care Management Note (Signed)
    Page 1 of 1   07/07/2013     4:20:43 PM   CARE MANAGEMENT NOTE 07/07/2013  Patient:  Nicholas Sutton, Nicholas Sutton   Account Number:  [id number]  Date Initiated:  07/07/2013  Documentation initiated by:  Lanier Clam  Subjective/Objective Assessment:   67 Y/O M ADMITTED W/DIVERTICULITIS.     Action/Plan:   FROM HOME.   Anticipated DC Date:  07/11/2013   Anticipated DC Plan:  HOME/SELF CARE      DC Planning Services  CM consult      Choice offered to / List presented to:             Status of service:  In process, will continue to follow Medicare Important Message given?   (If response is "NO", the following Medicare IM given date fields will be blank) Date Medicare IM given:   Date Additional Medicare IM given:    Discharge Disposition:    Per UR Regulation:  Reviewed for med. necessity/level of care/duration of stay  If discussed at Long Length of Stay Meetings, dates discussed:    Comments:  07/07/13 Ezelle Surprenant RN,BSN NCM [phone] POD#1 SIGMOID COLECTOMY.

## 2013-07-07 NOTE — Progress Notes (Signed)
Patient ID: Nicholas Sutton, male   DOB: 04/19/1946, 67 y.o.   MRN: [id number] 1 Day Post-Op  Subjective: Pt without C/O.  Some lower abd pain controlled with meds.  No nausea Had low BP overnight, now improved p fluids  Objective: Vital signs in last 24 hours: Temp:  [97.6 F (36.4 C)-98.3 F (36.8 C)] 97.6 F (36.4 C) (12/18 0449) Pulse Rate:  [77-121] 86 (12/18 0449) Resp:  [9-33] 16 (12/18 0449) BP: (85-141)/(40-97) 87/65 mmHg (12/18 0449)  BP currently 93/65 SpO2:  [96 %-100 %] 100 % (12/18 0449) Weight:  [160 lb (72.576 kg)] 160 lb (72.576 kg) (12/17 1459) Last BM Date: 07/06/13  Intake/Output from previous day: 12/17 0701 - 12/18 0700 In: 4752.7 [I.V.:4302.7] Out: 1530 [Urine:1430; Blood:100] Intake/Output this shift:    General appearance: alert, cooperative and no distress Resp: clear to auscultation bilaterally GI: abnormal findings:  moderate tenderness in the LLQ Incision/Wound: Clean and dry  Lab Results:   Recent Labs  07/07/13 0505  WBC 12.5*  HGB 11.8*  HCT 34.7*  PLT 156   BMET  Recent Labs  07/07/13 0505  NA 134*  K 4.3  CL 104  CO2 22  GLUCOSE 145*  BUN 8  CREATININE 0.73  CALCIUM 7.9*     Studies/Results: No results found.  Anti-infectives: Anti-infectives   Start     Dose/Rate Route Frequency Ordered Stop   07/06/13 1154  cefoTEtan (CEFOTAN) 2 g in dextrose 5 % 50 mL IVPB     2 g 100 mL/hr over 30 Minutes Intravenous On call to O.R. 07/06/13 1154 07/06/13 1308   07/06/13 1154  neomycin (MYCIFRADIN) tablet 1,000 mg  Status:  Discontinued     1,000 mg Oral 3 times per day 07/06/13 1154 07/06/13 1709   07/06/13 1154  erythromycin (E-MYCIN) tablet 1,000 mg  Status:  Discontinued     1,000 mg Oral 3 times per day 07/06/13 1154 07/06/13 1709      Assessment/Plan: s/p Procedure(s): COLON RESECTION SIGMOID repair of colovesicle fistula/ FISTULOTOMY Hypotensive overnight.  No apparent bleeding and good UOP, asymptomatic.   Improved p fluids.  Possibly dehydrated p bowel prep plus effect of beta blocker. Hold lopressor and observe    LOS: 1 day    Xavien Dauphinais T 07/07/2013

## 2013-07-07 NOTE — Progress Notes (Signed)
Pt with b/p of 85/60 manual. Pt w/o any s/s of distress or symptoms of hypotension. md called awaiting call back.

## 2013-07-07 NOTE — Progress Notes (Signed)
Pt still with low b/p. Surgeon on call called awaiting call back.

## 2013-07-07 NOTE — Progress Notes (Signed)
Surgeon on call called again about pt's b/p. Awaiting call back.

## 2013-07-08 LAB — CBC
HCT: 32.5 % — ABNORMAL LOW (ref 39.0–52.0)
Hemoglobin: 10.8 g/dL — ABNORMAL LOW (ref 13.0–17.0)
MCV: 87.1 fL (ref 78.0–100.0)
Platelets: 137 10*3/uL — ABNORMAL LOW (ref 150–400)
RBC: 3.73 MIL/uL — ABNORMAL LOW (ref 4.22–5.81)
WBC: 9 10*3/uL (ref 4.0–10.5)

## 2013-07-08 LAB — BASIC METABOLIC PANEL
BUN: 4 mg/dL — ABNORMAL LOW (ref 6–23)
CO2: 25 mEq/L (ref 19–32)
Chloride: 101 mEq/L (ref 96–112)
Creatinine, Ser: 0.73 mg/dL (ref 0.50–1.35)
Potassium: 3.6 mEq/L (ref 3.5–5.1)
Sodium: 133 mEq/L — ABNORMAL LOW (ref 135–145)

## 2013-07-08 NOTE — Progress Notes (Signed)
Patient ID: Nicholas Sutton, male   DOB: 24-Jan-1946, 67 y.o.   MRN: [id number] 2 Days Post-Op  Subjective: Feels better today.  No C/O.  Good pain control.  Some nausea yesterday but not this AM  Objective: Vital signs in last 24 hours: Temp:  [97.8 F (36.6 C)-98.4 F (36.9 C)] 97.8 F (36.6 C) (12/19 0523) Pulse Rate:  [84-100] 84 (12/19 0523) Resp:  [8-16] 14 (12/19 0523) BP: (94-103)/(69-78) 100/78 mmHg (12/19 0523) SpO2:  [93 %-98 %] 97 % (12/19 0523) Last BM Date: 07/06/13  Intake/Output from previous day: 12/18 0701 - 12/19 0700 In: 3236.3 [P.O.:240; I.V.:2996.3] Out: 3150 [Urine:3150] Intake/Output this shift:    General appearance: alert, cooperative and no distress GI: normal findings: soft, non-tender and non distended, a few BS Incision/Wound: Clean and dry, no erythema or drainage  Lab Results:   Recent Labs  07/07/13 0505 07/07/13 1326 07/08/13 0516  WBC 12.5*  --  9.0  HGB 11.8* 11.7* 10.8*  HCT 34.7* 34.2* 32.5*  PLT 156  --  137*   BMET  Recent Labs  07/07/13 0505 07/08/13 0516  NA 134* 133*  K 4.3 3.6  CL 104 101  CO2 22 25  GLUCOSE 145* 118*  BUN 8 4*  CREATININE 0.73 0.73  CALCIUM 7.9* 8.0*     Studies/Results: No results found.  Anti-infectives: Anti-infectives   Start     Dose/Rate Route Frequency Ordered Stop   07/06/13 1154  cefoTEtan (CEFOTAN) 2 g in dextrose 5 % 50 mL IVPB     2 g 100 mL/hr over 30 Minutes Intravenous On call to O.R. 07/06/13 1154 07/06/13 1308   07/06/13 1154  neomycin (MYCIFRADIN) tablet 1,000 mg  Status:  Discontinued     1,000 mg Oral 3 times per day 07/06/13 1154 07/06/13 1709   07/06/13 1154  erythromycin (E-MYCIN) tablet 1,000 mg  Status:  Discontinued     1,000 mg Oral 3 times per day 07/06/13 1154 07/06/13 1709      Assessment/Plan: s/p Procedure(s): COLON RESECTION SIGMOID repair of colovesicle fistula/ FISTULOTOMY Doing very well today. Cont CL diet.  Continue to increase activity   LOS: 2 days    Nicholas Sutton 07/08/2013

## 2013-07-09 MED ORDER — OXYCODONE-ACETAMINOPHEN 5-325 MG PO TABS
1.0000 | ORAL_TABLET | ORAL | Status: DC | PRN
  Administered 2013-07-10 (×2): 2 via ORAL
  Administered 2013-07-11: 1 via ORAL
  Administered 2013-07-11 (×2): 2 via ORAL
  Filled 2013-07-09: qty 2
  Filled 2013-07-09: qty 1
  Filled 2013-07-09 (×3): qty 2

## 2013-07-09 NOTE — Progress Notes (Signed)
Patient ID: Nicholas Sutton, male   DOB: 1946-03-04, 67 y.o.   MRN: [id number] 3 Days Post-Op  Subjective: No complaints this morning, feels "pretty well",tolerating clear liquids no nausea. Mild pain well controlled with meds. No flatus or bowel movement yet.  Objective: Vital signs in last 24 hours: Temp:  [98 F (36.7 C)-98.6 F (37 C)] 98 F (36.7 C) (12/20 0506) Pulse Rate:  [84-87] 87 (12/20 0506) Resp:  [16-18] 18 (12/20 0506) BP: (106-112)/(58-83) 106/70 mmHg (12/20 0506) SpO2:  [95 %-99 %] 96 % (12/20 0506) Last BM Date: 07/06/13  Intake/Output from previous day: 12/19 0701 - 12/20 0700 In: 2755.8 [P.O.:800; I.V.:1955.8] Out: 5625 [Urine:5625] Intake/Output this shift:    General appearance: alert, cooperative and no distress GI: mild lower communal tenderness without guarding. Nondistended. Active bowel sounds. Incision/Wound: clean and dry without erythema  Lab Results:   Recent Labs  07/07/13 0505 07/07/13 1326 07/08/13 0516  WBC 12.5*  --  9.0  HGB 11.8* 11.7* 10.8*  HCT 34.7* 34.2* 32.5*  PLT 156  --  137*   BMET  Recent Labs  07/07/13 0505 07/08/13 0516  NA 134* 133*  K 4.3 3.6  CL 104 101  CO2 22 25  GLUCOSE 145* 118*  BUN 8 4*  CREATININE 0.73 0.73  CALCIUM 7.9* 8.0*     Studies/Results: No results found.  Anti-infectives: Anti-infectives   Start     Dose/Rate Route Frequency Ordered Stop   07/06/13 1154  cefoTEtan (CEFOTAN) 2 g in dextrose 5 % 50 mL IVPB     2 g 100 mL/hr over 30 Minutes Intravenous On call to O.R. 07/06/13 1154 07/06/13 1308   07/06/13 1154  neomycin (MYCIFRADIN) tablet 1,000 mg  Status:  Discontinued     1,000 mg Oral 3 times per day 07/06/13 1154 07/06/13 1709   07/06/13 1154  erythromycin (E-MYCIN) tablet 1,000 mg  Status:  Discontinued     1,000 mg Oral 3 times per day 07/06/13 1154 07/06/13 1709      Assessment/Plan: s/p Procedure(s): COLON RESECTION SIGMOID repair of colovesicle fistula/  FISTULOTOMY Doing well without apparent complications. Advanced to full liquid diet. Try oral pain medication.   LOS: 3 days    Mishayla Sliwinski T 07/09/2013

## 2013-07-10 NOTE — Progress Notes (Signed)
Patient ID: Nicholas Sutton, male   DOB: Sep 16, 1945, 67 y.o.   MRN: [id number] 4 Days Post-Op  Subjective: Feels well. Pain is gradually better and easily controlled with meds. Tolerating full liquids without nausea. Has had several bowel movements.  Objective: Vital signs in last 24 hours: Temp:  [97.5 F (36.4 C)-98.1 F (36.7 C)] 97.5 F (36.4 C) (12/21 0636) Pulse Rate:  [82-106] 82 (12/21 0636) Resp:  [16-20] 16 (12/21 0636) BP: (91-111)/(59-84) 99/76 mmHg (12/21 0636) SpO2:  [93 %-99 %] 99 % (12/21 0636) Last BM Date: 07/09/13  Intake/Output from previous day: 12/20 0701 - 12/21 0700 In: 120 [P.O.:120] Out: 4225 [Urine:4225] Intake/Output this shift:    General appearance: alert, cooperative and no distress GI: normal findings: soft, non-tender Incision/Wound: clean without drainage or erythema  Lab Results:   Recent Labs  07/07/13 1326 07/08/13 0516  WBC  --  9.0  HGB 11.7* 10.8*  HCT 34.2* 32.5*  PLT  --  137*   BMET  Recent Labs  07/08/13 0516  NA 133*  K 3.6  CL 101  CO2 25  GLUCOSE 118*  BUN 4*  CREATININE 0.73  CALCIUM 8.0*     Studies/Results: No results found.  Anti-infectives: Anti-infectives   Start     Dose/Rate Route Frequency Ordered Stop   07/06/13 1154  cefoTEtan (CEFOTAN) 2 g in dextrose 5 % 50 mL IVPB     2 g 100 mL/hr over 30 Minutes Intravenous On call to O.R. 07/06/13 1154 07/06/13 1308   07/06/13 1154  neomycin (MYCIFRADIN) tablet 1,000 mg  Status:  Discontinued     1,000 mg Oral 3 times per day 07/06/13 1154 07/06/13 1709   07/06/13 1154  erythromycin (E-MYCIN) tablet 1,000 mg  Status:  Discontinued     1,000 mg Oral 3 times per day 07/06/13 1154 07/06/13 1709      Assessment/Plan: s/p Procedure(s): COLON RESECTION SIGMOID repair of colovesicle fistula/ FISTULOTOMY Doing very well. Advance to regular diet. Will get a cystogram tomorrow prior to removing Foley.   LOS: 4 days    Nicholas Sutton  T 07/10/2013

## 2013-07-11 ENCOUNTER — Inpatient Hospital Stay (HOSPITAL_COMMUNITY): Payer: BC Managed Care – PPO

## 2013-07-11 MED ORDER — OXYCODONE-ACETAMINOPHEN 5-325 MG PO TABS: 1.0000 | ORAL_TABLET | ORAL | Status: DC | PRN

## 2013-07-11 NOTE — Progress Notes (Signed)
Pt voided 250cc clear yellow urine. Post foley removal

## 2013-07-11 NOTE — Progress Notes (Signed)
Patient ID: Nicholas Sutton, male   DOB: 1945/08/10, 67 y.o.   MRN: [id number] 5 Days Post-Op  Subjective: No complaints this morning. Minimal incisional pain easily controlled with oral meds. Had a relatively normal bowel movement yesterday. Tolerating regular diet without difficulty.  Objective: Vital signs in last 24 hours: Temp:  [97.6 F (36.4 C)-98.2 F (36.8 C)] 97.9 F (36.6 C) (12/22 0629) Pulse Rate:  [90-114] 90 (12/22 0629) Resp:  [16-18] 18 (12/22 0629) BP: (104-119)/(72-87) 114/77 mmHg (12/22 0629) SpO2:  [99 %-100 %] 100 % (12/22 0629) Last BM Date: 07/10/13  Intake/Output from previous day: 12/21 0701 - 12/22 0700 In: 480 [P.O.:480] Out: 2700 [Urine:2700] Intake/Output this shift: Total I/O In: 240 [P.O.:240] Out: -   General appearance: alert, cooperative and no distress GI: flat soft and nontender. Incision/Wound: clean and dry without erythema  Lab Results:  No results found for this basename: WBC, HGB, HCT, PLT,  in the last 72 hours BMET No results found for this basename: NA, K, CL, CO2, GLUCOSE, BUN, CREATININE, CALCIUM,  in the last 72 hours   Studies/Results: No results found.  Anti-infectives: Anti-infectives   Start     Dose/Rate Route Frequency Ordered Stop   07/06/13 1154  cefoTEtan (CEFOTAN) 2 g in dextrose 5 % 50 mL IVPB     2 g 100 mL/hr over 30 Minutes Intravenous On call to O.R. 07/06/13 1154 07/06/13 1308   07/06/13 1154  neomycin (MYCIFRADIN) tablet 1,000 mg  Status:  Discontinued     1,000 mg Oral 3 times per day 07/06/13 1154 07/06/13 1709   07/06/13 1154  erythromycin (E-MYCIN) tablet 1,000 mg  Status:  Discontinued     1,000 mg Oral 3 times per day 07/06/13 1154 07/06/13 1709      Assessment/Plan: s/p Procedure(s): COLON RESECTION SIGMOID repair of colovesicle fistula/ FISTULOTOMY Doing well postoperatively without apparent complication. The cystogram ordered for this morning. If negative will DC Foley and discharge  patient after he is able to void.   LOS: 5 days    Monte Bronder T 07/11/2013

## 2013-07-11 NOTE — Progress Notes (Signed)
   CARE MANAGEMENT NOTE 07/11/2013  Patient:  Nicholas Sutton, Nicholas Sutton   Account Number:  [id number]  Date Initiated:  07/07/2013  Documentation initiated by:  Lanier Clam  Subjective/Objective Assessment:   67 Y/O M ADMITTED W/DIVERTICULITIS.     Action/Plan:   FROM HOME.   Anticipated DC Date:  07/11/2013   Anticipated DC Plan:  HOME/SELF CARE      DC Planning Services  CM consult      Choice offered to / List presented to:             Status of service:  Completed, signed off Medicare Important Message given?   (If response is "NO", the following Medicare IM given date fields will be blank) Date Medicare IM given:   Date Additional Medicare IM given:    Discharge Disposition:  HOME/SELF CARE  Per UR Regulation:  Reviewed for med. necessity/level of care/duration of stay  If discussed at Long Length of Stay Meetings, dates discussed:    Comments:  07/11/13 [name] RN,BSN NCM [phone] D/C HOME NO D/C NEEDS OR ORDERS.  07/07/13 [name] RN,BSN NCM [phone] POD#1 SIGMOID COLECTOMY.

## 2013-07-11 NOTE — Discharge Summary (Signed)
   Patient ID: Nicholas Sutton [id number] 67 y.o. 06-12-1946  07/06/2013  Discharge date and time: 07/11/2013   Admitting Physician: Glenna Fellows T  Discharge Physician: Glenna Fellows T  Admission Diagnoses: sigmoid diverticulitis colo vesicle fistula   Discharge Diagnoses: same  Operations: Procedure(s): COLON RESECTION SIGMOID repair of colovesicle fistula/ FISTULOTOMY  Admission Condition: fair  Discharged Condition: good  Indication for Admission: patient is a 67 year old male with a history of recurrent episodes of sigmoid diverticulitis. He presented a couple of months ago after an episode of diverticulitis treated with oral antibiotics as an outpatient when he developed fecal urea and hematuria. CT scan showed a small diverticular abscess and apparent colovesical fistula. I recommended sigmoid colectomy and takedown of his colovesical fistula after treatment of his acute diverticulitis. He completed a course of antibiotics with no residual pain or tenderness in his abdomen although continued fecal matter in his urine. He is now electively admitted after a mechanical and antibiotic bowel prep at home for planned sigmoid colectomy and takedown of his colovesical fistula.  Hospital Course: on the morning of admission the patient underwent an open sigmoid colectomy. The site of the bladder fistula was identified although testing in the operating room did not show any active leak from the bladder. His postoperative course was very smooth. He did have some hypotension the first postoperative evening which was felt secondary to dehydration from his prep combined with beta blocker that was given postoperatively for his atrial fibrillation. With a fluid bolus and discontinuation of his beta blocker this resolved. His pain was well controlled. He had no complications. By the fourth postoperative day he had bowel movements and was advanced to a regular diet. CBC was unremarkable.  His abdomen was soft and nontender and wound healing without evidence of infection. Pathology revealed acute and chronic diverticulitis. Today he is undergoing a cystogram and if there is no extravasation from his bladder the Foley catheter will be discontinued and he will be discharged.   Disposition: Home  Patient Instructions:    Medication List    STOP taking these medications       loperamide 2 MG tablet  Commonly known as:  IMODIUM A-D      TAKE these medications       aspirin EC 81 MG tablet  Take 1 tablet (81 mg total) by mouth daily.     cetirizine 10 MG tablet  Commonly known as:  ZYRTEC  Take 10 mg by mouth daily as needed for allergies.     CIALIS 5 MG tablet  Generic drug:  tadalafil  Take 5 mg by mouth daily as needed for erectile dysfunction.     hyoscyamine 0.125 MG SL tablet  Commonly known as:  LEVSIN SL  Take 0.125 mg by mouth every 4 (four) hours as needed for cramping.     ondansetron 8 MG disintegrating tablet  Commonly known as:  ZOFRAN-ODT  Take 8 mg by mouth every 8 (eight) hours as needed for nausea.     oxyCODONE-acetaminophen 5-325 MG per tablet  Commonly known as:  PERCOCET/ROXICET  Take 1-2 tablets by mouth every 4 (four) hours as needed for moderate pain.        Activity: no heavy lifting for 4 weeks Diet: regular diet Wound Care: none needed  Follow-up:  With Dr. Johna Sheriff in 10 days.  Signed: Mariella Saa MD, FACS  07/11/2013, 8:17 AM

## 2013-07-11 NOTE — Plan of Care (Signed)
Problem: Discharge Progression Outcomes Goal: Staples/sutures removed Outcome: Not Applicable Date Met:  07/11/13 MD will dc in post op visit

## 2013-07-11 NOTE — Progress Notes (Signed)
D/c instructions given. Pt and finance receptive to instruction given. No changes in initial am assessment at this time. Vital signs stable

## 2013-07-18 ENCOUNTER — Encounter (INDEPENDENT_AMBULATORY_CARE_PROVIDER_SITE_OTHER): Payer: Self-pay | Admitting: Surgery

## 2013-07-18 ENCOUNTER — Telehealth (INDEPENDENT_AMBULATORY_CARE_PROVIDER_SITE_OTHER): Payer: Self-pay | Admitting: General Surgery

## 2013-07-18 NOTE — Telephone Encounter (Signed)
Rx issued for Norco 5/325 mg, # 30 (thirty), 1-2 po Q4-6H prn, no refill.  Signed by Dr. Michaell Cowing for Dr. Johna Sheriff.  Pt called to pick up at front desk.

## 2013-07-18 NOTE — Telephone Encounter (Signed)
Pt called to request refill of post op pain meds.  He still a few of the Percocet left, but will run out before his appt Friday.  He understands he will have to come to the office to pick up the Rx, as narcotics cannot be called in anymore.  He will send his friend (on [employer]) to get it after we notify him the Rx is ready.  Please advise.

## 2013-07-18 NOTE — Telephone Encounter (Signed)
OK, someone else will have to write it as I am not in the office

## 2013-07-22 ENCOUNTER — Ambulatory Visit (INDEPENDENT_AMBULATORY_CARE_PROVIDER_SITE_OTHER): Payer: BC Managed Care – PPO | Admitting: General Surgery

## 2013-07-22 ENCOUNTER — Telehealth (INDEPENDENT_AMBULATORY_CARE_PROVIDER_SITE_OTHER): Payer: Self-pay | Admitting: General Surgery

## 2013-07-22 ENCOUNTER — Encounter (INDEPENDENT_AMBULATORY_CARE_PROVIDER_SITE_OTHER): Payer: Self-pay | Admitting: General Surgery

## 2013-07-22 VITALS — BP 118/64 | HR 68 | Resp 16 | Ht 72.0 in | Wt 153.4 lb

## 2013-07-22 DIAGNOSIS — Z09 Encounter for follow-up examination after completed treatment for conditions other than malignant neoplasm: Secondary | ICD-10-CM

## 2013-07-22 NOTE — Telephone Encounter (Signed)
Pt called to ask if ok for him to take Align probiotics?  He forgot to ask at his appt today with Dr. Excell Seltzer.  Please advise him:  [phone].

## 2013-07-22 NOTE — Progress Notes (Signed)
History: The patient returns for just over 2 weeks following sigmoid colectomy and repair of colovesical fistula for diverticulitis with abscess and fistula. He is getting along quite well. He fatigues easily as would be expected. Pain is gradually improving it is related to the incision. Tolerating his diet well. Bowels been moving well with just some constipation which has responded milk of magnesia. Urinating without difficulty and his urine is clear  Exam: BP 118/64  Pulse 68  Resp 16  Ht 6' (1.829 m)  Wt 153 lb 6.4 oz (69.582 kg)  BMI 20.80 kg/m2 General: A thin but well appearing in no distress Abdomen: Incision very nicely healed. Staples were removed. Soft and nontender.  Pathology:  Diagnosis Colon, segmental resection, sigmoid - ACUTE DIVERTICULITIS. AND DIVERTICULOSIS. - NO ATYPIA OR MALIGNANCY. - RESECTION MARGINS VIABLE.  Assessment and plan: doing well following sigmoid colectomy and repair of colovesical fistula with no complications identified. We reviewed diet and activity. Return in 4 weeks which should be a final check.

## 2013-07-22 NOTE — Patient Instructions (Signed)
Use MiraLAX as needed for constipation. No diet limitations. No lifting over 10 pounds for the next 4 weeks.

## 2013-07-22 NOTE — Telephone Encounter (Signed)
Per Dr. Excell Seltzer, called pt and okayed his taking the probiotics, per the label recommendations.

## 2013-07-26 ENCOUNTER — Ambulatory Visit: Payer: BC Managed Care – PPO | Admitting: Nurse Practitioner

## 2013-07-29 ENCOUNTER — Encounter (INDEPENDENT_AMBULATORY_CARE_PROVIDER_SITE_OTHER): Payer: BC Managed Care – PPO

## 2013-07-29 ENCOUNTER — Encounter: Payer: Self-pay | Admitting: Nurse Practitioner

## 2013-07-29 ENCOUNTER — Ambulatory Visit (INDEPENDENT_AMBULATORY_CARE_PROVIDER_SITE_OTHER): Payer: BC Managed Care – PPO | Admitting: Nurse Practitioner

## 2013-07-29 VITALS — BP 120/70 | HR 94 | Ht 72.0 in | Wt 153.2 lb

## 2013-07-29 DIAGNOSIS — I4891 Unspecified atrial fibrillation: Secondary | ICD-10-CM

## 2013-07-29 NOTE — Patient Instructions (Signed)
We will place a 24 hour Holter monitor today - this shows how fast and how slow your heart beats for 24 hours.  See Dr. Rayann Heman in 6 months  No restrictions from our standpoint in regards to activity level  Call the [place] office at [phone] if you have any questions, problems or concerns.

## 2013-07-29 NOTE — Progress Notes (Signed)
[name] Date of Birth: 1946-05-02 Medical Record #[id number]  History of Present Illness: Mr. Hollingsworth is seen back today for a follow up visit. Seen for Dr. Rayann Heman. He was seen here for a pre op clearance and noted to be in atrial fib - was asymptomatic and felt to be chronic. No cardiac history otherwise. Echo showed mild AS but no other structural changes of concern. Aspirin was recommended to be started after his surgery for diverticulitis.   Comes back today. Here with his wife. He tolerated his surgery well without issue. Now on baby aspirin. Remains totally asymptomatic with his atrial fib. BP is good at home. Notes his heart rate is in the 90 to 100s. No palpitations. No chest pain. Not short of breath.   Current Outpatient Prescriptions  Medication Sig Dispense Refill  . aspirin EC 81 MG tablet Take 1 tablet (81 mg total) by mouth daily.  90 tablet  3  . cetirizine (ZYRTEC) 10 MG tablet Take 10 mg by mouth daily as needed for allergies.       [name] HYDROcodone-acetaminophen (NORCO/VICODIN) 5-325 MG per tablet Take 1 tablet by mouth every 6 (six) hours as needed for moderate pain.      . Probiotic Product (PROBIOTIC DAILY PO) Take by mouth daily.       No current facility-administered medications for this visit.    No Known Allergies  Past Medical History  Diagnosis Date  . Diverticulitis   . Heart murmur     has had no problems   . Headache(784.0)     x 3 - 4 per year  . Fistula, intestinovesical   . Anxiety   . Atrial fibrillation     Past Surgical History  Procedure Laterality Date  . Appendectomy    . Hernia repair    . Gum surgery    . Colostomy revision N/A 07/06/2013    Procedure: COLON RESECTION SIGMOID;  Surgeon: Edward Jolly, MD;  Location: [facility];  Service: General;  Laterality: N/A;  . Fistulotomy N/A 07/06/2013    Procedure: repair of colovesicle fistula/ FISTULOTOMY;  Surgeon: Edward Jolly, MD;  Location: [facility];  Service: General;   Laterality: N/A;    History  Smoking status  . Former Smoker  . Quit date: 06/03/2008  Smokeless tobacco  . Never Used    History  Alcohol Use  . Yes    Comment: rare    Family History  Problem Relation Age of Onset  . Hypertension      Review of Systems: The review of systems is per the HPI.  All other systems were reviewed and are negative.  Physical Exam: BP 120/70  Pulse 94  Ht 6' (1.829 m)  Wt 153 lb 3.2 oz (69.491 kg)  BMI 20.77 kg/m2  SpO2 100% Patient is very pleasant and in no acute distress. Skin is warm and dry. Color is normal.  HEENT is unremarkable. Normocephalic/atraumatic. PERRL. Sclera are nonicteric. Neck is supple. No masses. No JVD. Lungs are clear. Cardiac exam shows an irregular rhythm. Fair rate control. Abdomen is soft. Extremities are without edema. Gait and ROM are intact. No gross neurologic deficits noted.  LABORATORY DATA:  Lab Results  Component Value Date   WBC 9.0 07/08/2013   HGB 10.8* 07/08/2013   HCT 32.5* 07/08/2013   PLT 137* 07/08/2013   GLUCOSE 118* 07/08/2013   NA 133* 07/08/2013   K 3.6 07/08/2013   CL 101 07/08/2013   CREATININE 0.73  07/08/2013   BUN 4* 07/08/2013   CO2 25 07/08/2013   TSH 2.89 07/04/2013   Echo Study Conclusions  - Left ventricle: Systolic function was normal. The estimated ejection fraction was in the range of 55% to 60%. Wall motion was normal; there were no regional wall motion abnormalities. - Aortic valve: There was mild stenosis.    Assessment / Plan: 1. Atrial fib - felt to be chronic - CHADS2VASC of just 1 (age 68) - now on aspirin. May not have good rate control. Will check 24 hour Holter. For now, no change in medicines. He remains totally asymptomatic. Explained that he will need occasional follow up as well.   2. Recent partial colectomy for diverticulitis  Holter is placed today. See him back in 6 months.   Patient is agreeable to this plan and will call if any problems develop  in the interim.   Burtis Junes, RN, [place] [address] [place], [place]  [phone]

## 2013-08-01 ENCOUNTER — Telehealth (INDEPENDENT_AMBULATORY_CARE_PROVIDER_SITE_OTHER): Payer: Self-pay | Admitting: *Deleted

## 2013-08-01 ENCOUNTER — Other Ambulatory Visit (INDEPENDENT_AMBULATORY_CARE_PROVIDER_SITE_OTHER): Payer: Self-pay | Admitting: *Deleted

## 2013-08-01 MED ORDER — HYDROCODONE-ACETAMINOPHEN 5-325 MG PO TABS: 1.0000 | ORAL_TABLET | Freq: Four times a day (QID) | ORAL | Status: DC | PRN

## 2013-08-01 NOTE — Telephone Encounter (Signed)
Prescription filled out and awaiting signature by urgent office MD at this time.

## 2013-08-01 NOTE — Telephone Encounter (Signed)
OK for a refill ?!

## 2013-08-01 NOTE — Telephone Encounter (Signed)
Patient called to ask for another refill on his Norco.  Patient had a colon resection done on 07/06/13.  Patient states he still has pain control issues at times.  I do not see that patient has had a refill on his pain medication since surgery.  Explained that I would send a message to Dr. Excell Seltzer to ask then we will give him a call once we know.  Patient states understanding and agreeable at this time.

## 2013-08-18 ENCOUNTER — Telehealth (INDEPENDENT_AMBULATORY_CARE_PROVIDER_SITE_OTHER): Payer: Self-pay | Admitting: General Surgery

## 2013-08-18 NOTE — Telephone Encounter (Signed)
Pt called to ask for more pain medicine.  He is about 6 weeks post op now, so explored why he is still having pain that might need narcotics.  He states that he experiences pain occasionally that feels like his "incision is about to rip open."  When asked if that pain is sharp, pulling, sometimes burning and intermittent, he agreed immediately.  Explained this is more likely scar tissue forming and is not unexpected.  Recommended he apply ice pack to the site and take 400-600 mg ibuprofen for the anti-inflammatory effects.  He has OV tomorrow with Dr. Excell Seltzer, so can further discuss with him at that time.  If he needs more narcotics, Dr. Excell Seltzer can give him Rx then.  Pt understands.

## 2013-08-19 ENCOUNTER — Ambulatory Visit (INDEPENDENT_AMBULATORY_CARE_PROVIDER_SITE_OTHER): Payer: BC Managed Care – PPO | Admitting: General Surgery

## 2013-08-19 ENCOUNTER — Encounter (INDEPENDENT_AMBULATORY_CARE_PROVIDER_SITE_OTHER): Payer: Self-pay | Admitting: General Surgery

## 2013-08-19 VITALS — BP 108/78 | HR 72 | Temp 97.6°F | Resp 14 | Ht 72.0 in | Wt 159.6 lb

## 2013-08-19 DIAGNOSIS — Z09 Encounter for follow-up examination after completed treatment for conditions other than malignant neoplasm: Secondary | ICD-10-CM

## 2013-08-19 MED ORDER — HYDROCODONE-ACETAMINOPHEN 5-325 MG PO TABS: 1.0000 | ORAL_TABLET | Freq: Four times a day (QID) | ORAL | Status: DC | PRN

## 2013-08-19 MED ORDER — HYDROCODONE-ACETAMINOPHEN 5-325 MG PO TABS
1.0000 | ORAL_TABLET | Freq: Four times a day (QID) | ORAL | Status: DC | PRN
Start: 2013-08-19 — End: 2013-08-19

## 2013-08-19 NOTE — Progress Notes (Signed)
Chief complaint: Followup colectomy  History: The patient returns now approaching 6 weeks after sigmoid colectomy and takedown of colovesical fistula for diverticulitis and abscess. He continues to get along well. He notices some significant fatigue still but this is gradually improving. He gets some occasional stinging discomfort along his incision but this is improving as well. Bowels are moving regularly. No fever or chills.  Exam: BP 108/78  Pulse 72  Temp(Src) 97.6 F (36.4 C) (Oral)  Resp 14  Ht 6' (1.829 m)  Wt 159 lb 9.6 oz (72.394 kg)  BMI 21.64 kg/m2 General: Appears well Abdomen: Soft and nontender. His wound is well-healed without evidence of infection or hernia or other complications  Assessment and plan: Doing very well following colectomy as above. Due to some incisional discomfort and persistent fatigue we will keep him out of work for 3 more weeks. I refilled his Vicodin and he will taper this off over the next couple of weeks. Return as needed.

## 2013-08-25 ENCOUNTER — Telehealth (INDEPENDENT_AMBULATORY_CARE_PROVIDER_SITE_OTHER): Payer: Self-pay

## 2013-08-25 NOTE — Telephone Encounter (Signed)
Returned pt's call. I spoke to pt about the message that he had left for [facility] yesterday but Alyse Low has been out sick this week. The pt wanted to know about the sutures that were used and if anything would bother a MRI if he ever has to get one done in the future. I asked Dr Excell Seltzer about the sutures and he replied that the sutures will not affect the MRI. I read the OP note to the pt that discussed Prolene sutures is what was used in his case. I will mail a copy of the OP note to the pt.

## 2014-05-05 ENCOUNTER — Other Ambulatory Visit: Payer: Self-pay

## 2015-01-15 ENCOUNTER — Other Ambulatory Visit: Payer: Self-pay

## 2015-07-02 NOTE — H&P (Signed)
  NTS SOAP Note  Vital Signs:  Vitals as of: 0000000: Systolic 0000000: Diastolic 95: Heart Rate 92: Temp 45F: Height 75ft 0in: Weight 182Lbs 0 Ounces: BMI 24.68  BMI : 24.68 kg/m2  Subjective: This 69 year old male presents for of a left inguinal hernia.  Has been present for one year.  Wears a truss to keep him comfortable.  Made worse with straining.  Review of Symptoms:  Constitutional:unremarkable   Head:unremarkable Eyes:unremarkable   Nose/Mouth/Throat:unremarkable Cardiovascular:  unremarkable Respiratory:unremarkable Gastrointestinal:  unremarkable   Genitourinary:unremarkable   Musculoskeletal:unremarkable Skin:unremarkable Hematolgic/Lymphatic:unremarkable   Allergic/Immunologic:unremarkable   Past Medical History:  Reviewed  Past Medical History  Surgical History: partial colectomy, hernia repair, appendectomy Allergies: nkda Medications: none   Social History:Reviewed  Social History  Preferred Language: English Race:  White Ethnicity: Not Hispanic / Latino Age: 37 year Marital Status:  S Alcohol: rarely   Smoking Status: Never smoker reviewed on 06/28/2015 Functional Status reviewed on 06/28/2015 ------------------------------------------------ Bathing: Normal Cooking: Normal Dressing: Normal Driving: Normal Eating: Normal Managing Meds: Normal Oral Care: Normal Shopping: Normal Toileting: Normal Transferring: Normal Walking: Normal Cognitive Status reviewed on 06/28/2015 ------------------------------------------------ Attention: Normal Decision Making: Normal Language: Normal Memory: Normal Motor: Normal Perception: Normal Problem Solving: Normal Visual and Spatial: Normal   Family History:Reviewed  Family Health History Mother, Living; Stroke (CVA);  Father, Living; Heart attack (myocardial infarction);     Objective Information: General:Well appearing, well nourished in no distress. Heart:RRR,  no murmur or gallop.  Normal S1, S2.  No S3, S4.  Lungs:  CTA bilaterally, no wheezes, rhonchi, rales.  Breathing unlabored. Abdomen:Soft, NT/ND, no HSM, no masses.  Reudcible left inguinal hernia.  Assessment:Left inguinal hernia  Diagnoses: 550.90  K40.90 Inguinal hernia (Unilateral inguinal hernia, without obstruction or gangrene, not specified as recurrent)  Procedures: [id number] - OFFICE OUTPATIENT NEW 30 MINUTES    Plan:  Will call to schedule left inguinal herniorrhaphy with mesh.   Patient Education:Alternative treatments to surgery were discussed with patient (and family).  Risks and benefits  of procedure ty of recurrence of the herniaincluding bleeding, infection, mesh use, and the possibility of recurrence of the hernia were fully explained to the patient (and family) who gave informed consent. Patient/family questions were addressed.  Follow-up:Pending Surgery

## 2015-07-05 NOTE — Patient Instructions (Signed)
Your procedure is scheduled on: 07/11/2015  Report to [facility] at    6:15 AM.  Call this number if you have problems the morning of surgery: [phone]   Remember:   Do not drink or eat food:After Midnight.  :  Take these medicines the morning of surgery with A SIP OF WATER: Zyrtec   Do not wear jewelry, make-up or nail polish.  Do not wear lotions, powders, or perfumes. You may wear deodorant.  Do not shave 48 hours prior to surgery. Men may shave face and neck.  Do not bring valuables to the hospital.  Contacts, dentures or bridgework may not be worn into surgery.  Leave suitcase in the car. After surgery it may be brought to your room.  For patients admitted to the hospital, checkout time is 11:00 AM the day of discharge.   Patients discharged the day of surgery will not be allowed to drive home.    Special Instructions: Shower using CHG night before surgery and shower the day of surgery use CHG.  Use special wash - you have one bottle of CHG for all showers.  You should use approximately 1/2 of the bottle for each shower.  Inguinal Hernia, Adult , Care After Refer to this sheet in the next few weeks. These discharge instructions provide you with general information on caring for yourself after you leave the hospital. Your caregiver may also give you specific instructions. Your treatment has been planned according to the most current medical practices available, but unavoidable complications sometimes occur. If you have any problems or questions after discharge, please call your caregiver. HOME CARE INSTRUCTIONS  Put ice on the operative site.  Put ice in a plastic bag.  Place a towel between your skin and the bag.  Leave the ice on for 15-20 minutes at a time, 03-04 times a day while awake.  Change bandages (dressings) as directed.  Keep the wound dry and clean. The wound may be washed gently with soap and water. Gently blot or dab the wound dry. It is okay to take  showers 24 to 48 hours after surgery. Do not take baths, use swimming pools, or use hot tubs for 10 days, or as directed by your caregiver.  Only take over-the-counter or prescription medicines for pain, discomfort, or fever as directed by your caregiver.  Continue your normal diet as directed.  Do not lift anything more than 10 pounds or play contact sports for 3 weeks, or as directed. SEEK MEDICAL CARE IF:  There is redness, swelling, or increasing pain in the wound.  There is fluid (pus) coming from the wound.  There is drainage from a wound lasting longer than 1 day.  You have an oral temperature above 102 F (38.9 C).  You notice a bad smell coming from the wound or dressing.  The wound breaks open after the stitches (sutures) have been removed.  You notice increasing pain in the shoulders (shoulder strap areas).  You develop dizzy episodes or fainting while standing.  You feel sick to your stomach (nauseous) or throw up (vomit). SEEK IMMEDIATE MEDICAL CARE IF:  You develop a rash.  You have difficulty breathing.  You develop a reaction or have side effects to medicines you were given. MAKE SURE YOU:   Understand these instructions.  Will watch your condition.  Will get help right away if you are not doing well or get worse.   This information is not intended to replace advice given to  you by your health care provider. Make sure you discuss any questions you have with your health care provider.   Document Released: 08/07/2006 Document Revised: 07/28/2014 Document Reviewed: 01/08/2015 Elsevier Interactive Patient Education 2016 [place].  Anesthesia, Adult, Care After Refer to this sheet in the next few weeks. These instructions provide you with information on caring for yourself after your procedure. Your health care provider may also give you more specific instructions. Your treatment has been planned according to current medical practices, but problems  sometimes occur. Call your health care provider if you have any problems or questions after your procedure. WHAT TO EXPECT AFTER THE PROCEDURE After the procedure, it is typical to experience:  Sleepiness.  Nausea and vomiting. HOME CARE INSTRUCTIONS  For the first 24 hours after general anesthesia:  Have a responsible person with you.  Do not drive a car. If you are alone, do not take public transportation.  Do not drink alcohol.  Do not take medicine that has not been prescribed by your health care provider.  Do not sign important papers or make important decisions.  You may resume a normal diet and activities as directed by your health care provider.  Change bandages (dressings) as directed.  If you have questions or problems that seem related to general anesthesia, call the hospital and ask for the anesthetist or anesthesiologist on call. SEEK MEDICAL CARE IF:  You have nausea and vomiting that continue the day after anesthesia.  You develop a rash. SEEK IMMEDIATE MEDICAL CARE IF:   You have difficulty breathing.  You have chest pain.  You have any allergic problems.   This information is not intended to replace advice given to you by your health care provider. Make sure you discuss any questions you have with your health care provider.   Document Released: 10/13/2000 Document Revised: 07/28/2014 Document Reviewed: 11/05/2011 Elsevier Interactive Patient Education [employer].

## 2015-07-06 ENCOUNTER — Other Ambulatory Visit: Payer: Self-pay

## 2015-07-06 ENCOUNTER — Encounter (HOSPITAL_COMMUNITY): Payer: Self-pay

## 2015-07-06 ENCOUNTER — Encounter (HOSPITAL_COMMUNITY)
Admission: RE | Admit: 2015-07-06 | Discharge: 2015-07-06 | Disposition: A | Discharge status: Home / Self Care | Payer: 59 | Source: Ambulatory Visit | Attending: General Surgery | Admitting: General Surgery

## 2015-07-06 DIAGNOSIS — Z01812 Encounter for preprocedural laboratory examination: Secondary | ICD-10-CM | POA: Insufficient documentation

## 2015-07-06 DIAGNOSIS — K4090 Unilateral inguinal hernia, without obstruction or gangrene, not specified as recurrent: Secondary | ICD-10-CM | POA: Insufficient documentation

## 2015-07-06 LAB — CBC WITH DIFFERENTIAL/PLATELET
Basophils Absolute: 0.1 10*3/uL (ref 0.0–0.1)
Basophils Relative: 1 %
Eosinophils Absolute: 0.3 10*3/uL (ref 0.0–0.7)
Eosinophils Relative: 4 %
HEMATOCRIT: 44.1 % (ref 39.0–52.0)
Hemoglobin: 14.9 g/dL (ref 13.0–17.0)
LYMPHS PCT: 26 %
Lymphs Abs: 2.1 10*3/uL (ref 0.7–4.0)
MCH: 31 pg (ref 26.0–34.0)
MCHC: 33.8 g/dL (ref 30.0–36.0)
MCV: 91.7 fL (ref 78.0–100.0)
MONOS PCT: 8 %
Monocytes Absolute: 0.6 10*3/uL (ref 0.1–1.0)
NEUTROS ABS: 5 10*3/uL (ref 1.7–7.7)
Neutrophils Relative %: 61 %
Platelets: 173 10*3/uL (ref 150–400)
RBC: 4.81 MIL/uL (ref 4.22–5.81)
RDW: 13.7 % (ref 11.5–15.5)
WBC: 8.1 10*3/uL (ref 4.0–10.5)

## 2015-07-06 LAB — BASIC METABOLIC PANEL
ANION GAP: 6 (ref 5–15)
BUN: 19 mg/dL (ref 6–20)
CO2: 28 mmol/L (ref 22–32)
Calcium: 9.2 mg/dL (ref 8.9–10.3)
Chloride: 103 mmol/L (ref 101–111)
Creatinine, Ser: 0.93 mg/dL (ref 0.61–1.24)
GFR calc Af Amer: >60 mL/min (ref >60)
GFR calc non Af Amer: >60 mL/min (ref >60)
GLUCOSE: 89 mg/dL (ref 65–99)
POTASSIUM: 4.5 mmol/L (ref 3.5–5.1)
Sodium: 137 mmol/L (ref 135–145)

## 2015-07-06 NOTE — Pre-Procedure Instructions (Signed)
Patient in for PAT. EKG shows AFib with rate of 94. Patient is on no meds for AFib. He states he found out he was in AFib 2 years ago prior to "my other hernia surgery". He states he did see a cardiologist after his last surgery and was placed on holter monitor but he was not placed on beta-blockers or a blood thinner, "I was told that a baby aspirin a day was all that I needed. Notes from those visits were found in epic and shown to Dr Milford Cage and she is ok to continue with this surgery without further follow-up with cardiologist.

## 2015-07-11 ENCOUNTER — Ambulatory Visit (HOSPITAL_COMMUNITY)
Admission: RE | Admit: 2015-07-11 | Discharge: 2015-07-11 | Disposition: A | Discharge status: Home / Self Care | Payer: 59 | Source: Ambulatory Visit | Attending: General Surgery | Admitting: General Surgery

## 2015-07-11 ENCOUNTER — Encounter (HOSPITAL_COMMUNITY): Payer: Self-pay | Admitting: *Deleted

## 2015-07-11 ENCOUNTER — Encounter (HOSPITAL_COMMUNITY)
Admission: RE | Disposition: A | Discharge status: Home / Self Care | Payer: Self-pay | Source: Ambulatory Visit | Attending: General Surgery

## 2015-07-11 ENCOUNTER — Ambulatory Visit (HOSPITAL_COMMUNITY): Payer: 59 | Admitting: Anesthesiology

## 2015-07-11 DIAGNOSIS — Z5309 Procedure and treatment not carried out because of other contraindication: Secondary | ICD-10-CM | POA: Diagnosis not present

## 2015-07-11 DIAGNOSIS — K4090 Unilateral inguinal hernia, without obstruction or gangrene, not specified as recurrent: Secondary | ICD-10-CM | POA: Insufficient documentation

## 2015-07-11 SURGERY — REPAIR, HERNIA, INGUINAL, ADULT
Anesthesia: General | Laterality: Left

## 2015-07-11 MED ORDER — PROPOFOL 10 MG/ML IV BOLUS
INTRAVENOUS | Status: AC
  Filled 2015-07-11: qty 40

## 2015-07-11 MED ORDER — CEFAZOLIN SODIUM-DEXTROSE 2-3 GM-% IV SOLR: 2.0000 g | INTRAVENOUS | Status: AC

## 2015-07-11 MED ORDER — CHLORHEXIDINE GLUCONATE 4 % EX LIQD: 1.0000 "application " | Freq: Once | CUTANEOUS | Status: DC

## 2015-07-11 MED ORDER — BUPIVACAINE LIPOSOME 1.3 % IJ SUSP
INTRAMUSCULAR | Status: AC
  Filled 2015-07-11: qty 20

## 2015-07-11 MED ORDER — LIDOCAINE HCL (PF) 1 % IJ SOLN
INTRAMUSCULAR | Status: AC
  Filled 2015-07-11: qty 5

## 2015-07-11 NOTE — Progress Notes (Signed)
Contacted Dr. Gerarda Fraction, primary care.  He has made referral for patient to see Cardiology.  Patient understands.  Will do surgery once cleared by Cardiology.

## 2015-07-11 NOTE — Progress Notes (Signed)
Pt in afib (rate 95-140 bpm). Dr. Patsey Berthold notified. Dr. Arnoldo Morale and Dr. Patsey Berthold agree to postpone until seen by PCP and cardiology.

## 2015-07-11 NOTE — Interval H&P Note (Signed)
History and Physical Interval Note:  07/11/2015 7:03 AM  Nicholas Sutton  has presented today for surgery, with the diagnosis of left inguinal hernia  The various methods of treatment have been discussed with the patient and family. After consideration of risks, benefits and other options for treatment, the patient has consented to  Procedure(s): HERNIA REPAIR INGUINAL ADULT WITH MESH (Left) as a surgical intervention .  The patient's history has been reviewed, patient examined, no change in status, stable for surgery.  I have reviewed the patient's chart and labs.  Questions were answered to the patient's satisfaction.     Aviva Signs A

## 2015-07-25 ENCOUNTER — Other Ambulatory Visit (HOSPITAL_COMMUNITY): Payer: Self-pay

## 2015-07-30 ENCOUNTER — Ambulatory Visit: Admit: 2015-07-30 | Payer: Medicare Other | Admitting: General Surgery

## 2015-07-30 SURGERY — REPAIR, HERNIA, INGUINAL, ADULT
Anesthesia: General | Laterality: Left

## 2015-09-11 ENCOUNTER — Ambulatory Visit: Payer: Self-pay | Admitting: Surgery

## 2015-09-11 NOTE — H&P (Signed)
  History of Present Illness Nicholas Sutton. Abrahm Mancia MD; 09/11/2015 4:41 PM) The patient is a 70 year old male who presents with an inguinal hernia. This is a patient of Dr. Lear Ng who presents for a third opinion on his left inguinal hernia. 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 He has a remote history of open right inguinal hernia repair. Just after Thanksgiving 2015 while doing heavy work, he felt some discomfort in both groins. The following day he sneezed and felt a pop in his left groin and had some discomfort and what he describes as a definite lump in the left groin. He was examined by Dr. Excell Seltzer last January, but no obvious hernia was felt at that time. Over the last year, he has developed a visible bulge in his left groin. It remains reducible. He denies any GI symptoms. He was seen by Dr. Aviva Signs in [place] for possible repair but had some cardiac issues prior to surgery. He was seen by Dr. Hamilton Capri in [place], [place] who has apparently cleared him for surgery. He has another follow-up with him on Thursday of this week. We do not have any records from the cardiologist. He presents now to discuss surgical repair.   Allergies Elbert Ewings, [place]; 09/11/2015 2:24 PM) No Known Drug Allergies 07/26/2014  Medication History Elbert Ewings, [place]; 09/11/2015 2:24 PM) Metoprolol Tartrate (25MG  Tablet, Oral) Active. Aspirin (325MG  Tablet, Oral) Active. Medications Reconciled    Vitals Elbert Ewings CMA; 09/11/2015 2:25 PM) 09/11/2015 2:24 PM Weight: 187.8 lb Height: 72in Body Surface Area: 2.07 m Body Mass Index: 25.47 kg/m  Temp.: 97.62F(Temporal)  Pulse: 97 (Regular)  BP: 130/76 (Sitting, Left Arm, Standard)      Physical Exam [name] K. Hazel Leveille MD; 09/11/2015 4:42 PM)  The physical exam findings are as follows: Note:WDWN in NAD HEENT: EOMI, sclera anicteric Neck: No masses, no thyromegaly Lungs: CTA  bilaterally; normal respiratory effort CV: Regular rate and rhythm; no murmurs Abd: +bowel sounds, soft, non-tender, no masses Healed lower midline incision - no sign of hernia No right inguinal hernia Visible reducible left inguinal hernia GU: no testicular masses Ext: Well-perfused; no edema Skin: Warm, dry; no sign of jaundice    Assessment & Plan [name] K. Mohini Heathcock MD; 09/11/2015 4:43 PM)  INGUINAL HERNIA, LEFT (K40.90)  Current Plans Schedule for Surgery - Left inguinal hernia repair with mesh. The surgical procedure has been discussed with the patient. He is not a candidate for laparoscopic preperitoneal repair because of his previous colon surgery. Potential risks, benefits, alternative treatments, and expected outcomes have been explained. All of the patient's questions at this time have been answered. The likelihood of reaching the patient's treatment goal is good. The patient understand the proposed surgical procedure and wishes to proceed. Pt Education - Pamphlet Given - Hernia Surgery: discussed with patient and provided information.  Nicholas Sutton. Georgette Dover, MD, [facility] Surgery  General/ Trauma Surgery  09/11/2015 4:43 PM

## 2015-10-03 ENCOUNTER — Encounter (HOSPITAL_COMMUNITY)
Admission: RE | Admit: 2015-10-03 | Discharge: 2015-10-03 | Disposition: A | Discharge status: Home / Self Care | Payer: 59 | Source: Ambulatory Visit | Attending: Surgery | Admitting: Surgery

## 2015-10-03 ENCOUNTER — Encounter (HOSPITAL_COMMUNITY): Payer: Self-pay

## 2015-10-03 DIAGNOSIS — K4090 Unilateral inguinal hernia, without obstruction or gangrene, not specified as recurrent: Secondary | ICD-10-CM | POA: Insufficient documentation

## 2015-10-03 DIAGNOSIS — Z01818 Encounter for other preprocedural examination: Secondary | ICD-10-CM | POA: Diagnosis present

## 2015-10-03 DIAGNOSIS — Z87891 Personal history of nicotine dependence: Secondary | ICD-10-CM | POA: Insufficient documentation

## 2015-10-03 DIAGNOSIS — I482 Chronic atrial fibrillation: Secondary | ICD-10-CM | POA: Diagnosis not present

## 2015-10-03 DIAGNOSIS — Z01812 Encounter for preprocedural laboratory examination: Secondary | ICD-10-CM | POA: Insufficient documentation

## 2015-10-03 HISTORY — DX: Diarrhea, unspecified: R19.7

## 2015-10-03 LAB — BASIC METABOLIC PANEL
ANION GAP: 13 (ref 5–15)
BUN: 16 mg/dL (ref 6–20)
CALCIUM: 10 mg/dL (ref 8.9–10.3)
CO2: 26 mmol/L (ref 22–32)
Chloride: 101 mmol/L (ref 101–111)
Creatinine, Ser: 1.09 mg/dL (ref 0.61–1.24)
GFR calc Af Amer: >60 mL/min (ref >60)
GFR calc non Af Amer: >60 mL/min (ref >60)
GLUCOSE: 97 mg/dL (ref 65–99)
Potassium: 4.7 mmol/L (ref 3.5–5.1)
Sodium: 140 mmol/L (ref 135–145)

## 2015-10-03 LAB — CBC
HCT: 45 % (ref 39.0–52.0)
HEMOGLOBIN: 15 g/dL (ref 13.0–17.0)
MCH: 30.5 pg (ref 26.0–34.0)
MCHC: 33.3 g/dL (ref 30.0–36.0)
MCV: 91.6 fL (ref 78.0–100.0)
Platelets: 174 10*3/uL (ref 150–400)
RBC: 4.91 MIL/uL (ref 4.22–5.81)
RDW: 13.8 % (ref 11.5–15.5)
WBC: 8.5 10*3/uL (ref 4.0–10.5)

## 2015-10-03 NOTE — Progress Notes (Addendum)
Pt with history of atrial fibrillation and aortic stenosis due to bicuspid aortic valve had surgery canceled in December with need for work up.  Pt saw his cardiologist, Dr Hamilton Capri in [place] 09/13/15 and brought in cardiac clearance note.   Stated had an EKG and Echocardiogram at that time. Records requested.  Stated had stress test over five years ago and requested that as well.  Pt placed call to cardiologist for instructions as to when to stop Aspirin (voice mail left while he was here)  Pt called this morning and stated was told to stop Aspirin 5 days prior to surgery.

## 2015-10-03 NOTE — Pre-Procedure Instructions (Signed)
    Nicholas Sutton  10/03/2015      [facility], [place] [id number] [place] [id number] Phone: [phone] Fax: [phone]    Your procedure is scheduled on Wednesday March 22nd at 8:30 am  Report to [facility] Admitting at 6:30 am  Call this number if you have problems the morning of surgery:  [phone]   Remember:  Do not eat food or drink liquids after midnight.  Take these medicines the morning of surgery with A SIP OF WATER: Metoprolol, may take Tylenol if needed  Take your Aspirin as instructed by your doctor.   Stop taking all Nsaids (i.e. Ibuprofen, Aleve, Motrin, Naproxen, Advil), vitamin supplements and herbal supplements including your probiotic    Do not wear jewelry  Do not wear lotions, powders, or perfumes.  You may not wear deodorant.  Men may shave face and neck.   Do not bring valuables to the hospital.  [facility] is not responsible for any belongings or valuables.  Contacts, dentures or bridgework may not be worn into surgery.  Leave your suitcase in the car.  After surgery it may be brought to your room.  For patients admitted to the hospital, discharge time will be determined by your treatment team.  Patients discharged the day of surgery will not be allowed to drive home.   Please read over the following fact sheets that you were given. Pain Booklet, Coughing and Deep Breathing and Surgical Site Infection Prevention

## 2015-10-04 NOTE — Progress Notes (Signed)
Anesthesia Chart Review:  Pt is a 70 year old male scheduled for L inguinal hernia repair, insertion of mesh on 10/10/2015 with Dr. Georgette Dover.   Pt was originally scheduled for this surgery at [facility] in 06/2015 but it was cancelled DOS when pt found to be in rapid a fib.   Cardiologist is Dr. Almira Coaster (care everywhere) who has cleared pt for surgery).   PMH includes:  Chronic atrial fibrillation, heart murmur. Former smoker. BMI 25.   Preoperative labs reviewed.    EKG 09/13/15: atrial fibrillation (100 bpm).   Echo 07/12/2015:  1. LV grossly normal size. Normal LV wall thickness. EF mildly reduced (45-50%). Unable to adequately determine diastolic dysfunction 2. LA moderately dilated 3. RA mildly dilated 4. RV systolic pressure elevated between 50-60 mmHg, consistent with moderately severe pulmonary HTN 5. Possible bicuspid aortic valve. Heavy calcification of aortic valve. Mild (1+) aortic regurgitation present.   If no changes, I anticipate pt can proceed with surgery as scheduled.   Willeen Cass, FNP-BC [facility]/Anesthesiology Phone: [phone] 10/04/2015 3:11 PM

## 2015-10-09 MED ORDER — CEFAZOLIN SODIUM-DEXTROSE 2-3 GM-% IV SOLR
2.0000 g | INTRAVENOUS | Status: AC
  Administered 2015-10-10: 2 g via INTRAVENOUS
  Filled 2015-10-09: qty 50

## 2015-10-09 NOTE — Anesthesia Preprocedure Evaluation (Addendum)
Anesthesia Evaluation  Patient identified by MRN, date of birth, ID band Patient awake    Reviewed: Allergy & Precautions, NPO status , Patient's Chart, lab work & pertinent test results  Airway Mallampati: II  TM Distance: >3 FB Neck ROM: Full    Dental  (+) Teeth Intact, Dental Advisory Given   Pulmonary former smoker,    breath sounds clear to auscultation       Cardiovascular + dysrhythmias Atrial Fibrillation + Valvular Problems/Murmurs (bicuspid AV) AI and AS  Rhythm:Irregular Rate:Normal     Neuro/Psych negative neurological ROS     GI/Hepatic negative GI ROS, Neg liver ROS,   Endo/Other  negative endocrine ROS  Renal/GU negative Renal ROS     Musculoskeletal   Abdominal   Peds  Hematology negative hematology ROS (+)   Anesthesia Other Findings   Reproductive/Obstetrics                         Lab Results  Component Value Date   WBC 8.5 10/03/2015   HGB 15.0 10/03/2015   HCT 45.0 10/03/2015   MCV 91.6 10/03/2015   PLT 174 10/03/2015   Lab Results  Component Value Date   CREATININE 1.09 10/03/2015   BUN 16 10/03/2015   NA 140 10/03/2015   K 4.7 10/03/2015   CL 101 10/03/2015   CO2 26 10/03/2015    Anesthesia Physical Anesthesia Plan  ASA: III  Anesthesia Plan: General and Regional   Post-op Pain Management: GA combined w/ Regional for post-op pain   Induction: Intravenous  Airway Management Planned: Oral ETT  Additional Equipment:   Intra-op Plan:   Post-operative Plan: Extubation in OR  Informed Consent: I have reviewed the patients History and Physical, chart, labs and discussed the procedure including the risks, benefits and alternatives for the proposed anesthesia with the patient or authorized representative who has indicated his/her understanding and acceptance.   Dental advisory given  Plan Discussed with: CRNA  Anesthesia Plan Comments:         Anesthesia Quick Evaluation

## 2015-10-10 ENCOUNTER — Encounter (HOSPITAL_COMMUNITY)
Admission: RE | Disposition: A | Discharge status: Home / Self Care | Payer: Self-pay | Source: Ambulatory Visit | Attending: Surgery

## 2015-10-10 ENCOUNTER — Encounter (HOSPITAL_COMMUNITY): Payer: Self-pay | Admitting: *Deleted

## 2015-10-10 ENCOUNTER — Ambulatory Visit (HOSPITAL_COMMUNITY): Payer: 59 | Admitting: Anesthesiology

## 2015-10-10 ENCOUNTER — Ambulatory Visit (HOSPITAL_COMMUNITY)
Admission: RE | Admit: 2015-10-10 | Discharge: 2015-10-10 | Disposition: A | Discharge status: Home / Self Care | Payer: 59 | Source: Ambulatory Visit | Attending: Surgery | Admitting: Surgery

## 2015-10-10 ENCOUNTER — Ambulatory Visit (HOSPITAL_COMMUNITY): Payer: 59 | Admitting: Emergency Medicine

## 2015-10-10 DIAGNOSIS — I4891 Unspecified atrial fibrillation: Secondary | ICD-10-CM | POA: Diagnosis not present

## 2015-10-10 DIAGNOSIS — Z87891 Personal history of nicotine dependence: Secondary | ICD-10-CM | POA: Insufficient documentation

## 2015-10-10 DIAGNOSIS — K4090 Unilateral inguinal hernia, without obstruction or gangrene, not specified as recurrent: Secondary | ICD-10-CM | POA: Insufficient documentation

## 2015-10-10 DIAGNOSIS — Z79899 Other long term (current) drug therapy: Secondary | ICD-10-CM | POA: Insufficient documentation

## 2015-10-10 DIAGNOSIS — Z7982 Long term (current) use of aspirin: Secondary | ICD-10-CM | POA: Diagnosis not present

## 2015-10-10 DIAGNOSIS — Z9049 Acquired absence of other specified parts of digestive tract: Secondary | ICD-10-CM | POA: Diagnosis not present

## 2015-10-10 HISTORY — PX: INSERTION OF MESH: SHX5868

## 2015-10-10 HISTORY — PX: INGUINAL HERNIA REPAIR: SHX194

## 2015-10-10 SURGERY — REPAIR, HERNIA, INGUINAL, ADULT
Anesthesia: Regional | Site: Groin | Laterality: Left

## 2015-10-10 MED ORDER — PROPOFOL 10 MG/ML IV BOLUS
INTRAVENOUS | Status: AC
  Filled 2015-10-10: qty 20

## 2015-10-10 MED ORDER — 0.9 % SODIUM CHLORIDE (POUR BTL) OPTIME
TOPICAL | Status: DC | PRN
  Administered 2015-10-10: 1000 mL

## 2015-10-10 MED ORDER — MORPHINE SULFATE (PF) 2 MG/ML IV SOLN: 2.0000 mg | INTRAVENOUS | Status: DC | PRN

## 2015-10-10 MED ORDER — HYDROCODONE-ACETAMINOPHEN 7.5-325 MG PO TABS
1.0000 | ORAL_TABLET | Freq: Once | ORAL | Status: AC | PRN
  Administered 2015-10-10: 1 via ORAL

## 2015-10-10 MED ORDER — EPHEDRINE SULFATE 50 MG/ML IJ SOLN
INTRAMUSCULAR | Status: AC
  Filled 2015-10-10: qty 1

## 2015-10-10 MED ORDER — PROPOFOL 10 MG/ML IV BOLUS
INTRAVENOUS | Status: DC | PRN
  Administered 2015-10-10: 130 mg via INTRAVENOUS

## 2015-10-10 MED ORDER — PHENYLEPHRINE HCL 10 MG/ML IJ SOLN
INTRAMUSCULAR | Status: DC | PRN
  Administered 2015-10-10: 80 ug via INTRAVENOUS

## 2015-10-10 MED ORDER — SUCCINYLCHOLINE CHLORIDE 20 MG/ML IJ SOLN
INTRAMUSCULAR | Status: DC | PRN
  Administered 2015-10-10: 100 mg via INTRAVENOUS

## 2015-10-10 MED ORDER — PHENYLEPHRINE HCL 10 MG/ML IJ SOLN
10.0000 mg | INTRAVENOUS | Status: DC | PRN
  Administered 2015-10-10: 25 ug/min via INTRAVENOUS

## 2015-10-10 MED ORDER — LIDOCAINE HCL (CARDIAC) 20 MG/ML IV SOLN
INTRAVENOUS | Status: DC | PRN
Start: 2015-10-10 — End: 2015-10-10
  Administered 2015-10-10: 60 mg via INTRAVENOUS

## 2015-10-10 MED ORDER — HYDROCODONE-ACETAMINOPHEN 7.5-325 MG PO TABS
ORAL_TABLET | ORAL | Status: AC
  Filled 2015-10-10: qty 1

## 2015-10-10 MED ORDER — FENTANYL CITRATE (PF) 100 MCG/2ML IJ SOLN
25.0000 ug | INTRAMUSCULAR | Status: DC | PRN
  Administered 2015-10-10: 50 ug via INTRAVENOUS
  Administered 2015-10-10 (×2): 25 ug via INTRAVENOUS

## 2015-10-10 MED ORDER — BUPIVACAINE-EPINEPHRINE (PF) 0.5% -1:200000 IJ SOLN
INTRAMUSCULAR | Status: DC | PRN
  Administered 2015-10-10: 25 mL

## 2015-10-10 MED ORDER — ONDANSETRON HCL 4 MG/2ML IJ SOLN: 4.0000 mg | INTRAMUSCULAR | Status: DC | PRN

## 2015-10-10 MED ORDER — MIDAZOLAM HCL 5 MG/5ML IJ SOLN
INTRAMUSCULAR | Status: DC | PRN
  Administered 2015-10-10: 2 mg via INTRAVENOUS

## 2015-10-10 MED ORDER — ROCURONIUM BROMIDE 50 MG/5ML IV SOLN
INTRAVENOUS | Status: AC
  Filled 2015-10-10: qty 1

## 2015-10-10 MED ORDER — FENTANYL CITRATE (PF) 250 MCG/5ML IJ SOLN
INTRAMUSCULAR | Status: AC
  Filled 2015-10-10: qty 5

## 2015-10-10 MED ORDER — OXYCODONE-ACETAMINOPHEN 5-325 MG PO TABS: 1.0000 | ORAL_TABLET | ORAL | Status: DC | PRN

## 2015-10-10 MED ORDER — LIDOCAINE HCL (CARDIAC) 20 MG/ML IV SOLN
INTRAVENOUS | Status: AC
  Filled 2015-10-10: qty 10

## 2015-10-10 MED ORDER — MIDAZOLAM HCL 2 MG/2ML IJ SOLN
INTRAMUSCULAR | Status: AC
  Filled 2015-10-10: qty 2

## 2015-10-10 MED ORDER — FENTANYL CITRATE (PF) 100 MCG/2ML IJ SOLN
INTRAMUSCULAR | Status: DC | PRN
  Administered 2015-10-10: 50 ug via INTRAVENOUS
  Administered 2015-10-10: 25 ug via INTRAVENOUS

## 2015-10-10 MED ORDER — ARTIFICIAL TEARS OP OINT
TOPICAL_OINTMENT | OPHTHALMIC | Status: AC
  Filled 2015-10-10: qty 3.5

## 2015-10-10 MED ORDER — BUPIVACAINE-EPINEPHRINE 0.25% -1:200000 IJ SOLN
INTRAMUSCULAR | Status: DC | PRN
  Administered 2015-10-10: 10 mL

## 2015-10-10 MED ORDER — CHLORHEXIDINE GLUCONATE 4 % EX LIQD: 1.0000 "application " | Freq: Once | CUTANEOUS | Status: DC

## 2015-10-10 MED ORDER — ONDANSETRON HCL 4 MG/2ML IJ SOLN
INTRAMUSCULAR | Status: AC
  Filled 2015-10-10: qty 2

## 2015-10-10 MED ORDER — FENTANYL CITRATE (PF) 100 MCG/2ML IJ SOLN
INTRAMUSCULAR | Status: AC
  Administered 2015-10-10: 25 ug via INTRAVENOUS
  Filled 2015-10-10: qty 2

## 2015-10-10 MED ORDER — SODIUM CHLORIDE 0.9 % IJ SOLN
INTRAMUSCULAR | Status: AC
  Filled 2015-10-10: qty 10

## 2015-10-10 MED ORDER — LIDOCAINE HCL 4 % EX SOLN
CUTANEOUS | Status: DC | PRN
  Administered 2015-10-10: 3 mL via TOPICAL

## 2015-10-10 MED ORDER — SUCCINYLCHOLINE CHLORIDE 20 MG/ML IJ SOLN
INTRAMUSCULAR | Status: AC
  Filled 2015-10-10: qty 1

## 2015-10-10 MED ORDER — ONDANSETRON HCL 4 MG/2ML IJ SOLN
INTRAMUSCULAR | Status: DC | PRN
  Administered 2015-10-10: 4 mg via INTRAVENOUS

## 2015-10-10 MED ORDER — ARTIFICIAL TEARS OP OINT
TOPICAL_OINTMENT | OPHTHALMIC | Status: DC | PRN
  Administered 2015-10-10: 1 via OPHTHALMIC

## 2015-10-10 MED ORDER — BUPIVACAINE-EPINEPHRINE (PF) 0.25% -1:200000 IJ SOLN
INTRAMUSCULAR | Status: AC
  Filled 2015-10-10: qty 30

## 2015-10-10 MED ORDER — PROMETHAZINE HCL 25 MG/ML IJ SOLN: 6.2500 mg | INTRAMUSCULAR | Status: DC | PRN

## 2015-10-10 MED ORDER — LACTATED RINGERS IV SOLN
INTRAVENOUS | Status: DC | PRN
  Administered 2015-10-10 (×2): via INTRAVENOUS

## 2015-10-10 SURGICAL SUPPLY — 52 items
APL SKNCLS STERI-STRIP NONHPOA (GAUZE/BANDAGES/DRESSINGS) ×1
BENZOIN TINCTURE PRP APPL 2/3 (GAUZE/BANDAGES/DRESSINGS) ×3 IMPLANT
BLADE SURG 15 STRL LF DISP TIS (BLADE) ×1 IMPLANT
BLADE SURG 15 STRL SS (BLADE) ×3
BLADE SURG ROTATE 9660 (MISCELLANEOUS) ×2 IMPLANT
CHLORAPREP W/TINT 26ML (MISCELLANEOUS) ×3 IMPLANT
CLOSURE WOUND 1/2 X4 (GAUZE/BANDAGES/DRESSINGS) ×1
COVER SURGICAL LIGHT HANDLE (MISCELLANEOUS) ×3 IMPLANT
DRAIN PENROSE 1/2X12 LTX STRL (WOUND CARE) ×2 IMPLANT
DRAPE LAPAROSCOPIC ABDOMINAL (DRAPES) IMPLANT
DRAPE LAPAROTOMY TRNSV 102X78 (DRAPE) ×2 IMPLANT
DRAPE UTILITY XL STRL (DRAPES) ×6 IMPLANT
DRSG TEGADERM 4X4.75 (GAUZE/BANDAGES/DRESSINGS) ×3 IMPLANT
ELECT CAUTERY BLADE 6.4 (BLADE) ×3 IMPLANT
ELECT REM PT RETURN 9FT ADLT (ELECTROSURGICAL) ×3
ELECTRODE REM PT RTRN 9FT ADLT (ELECTROSURGICAL) ×1 IMPLANT
GAUZE SPONGE 4X4 12PLY STRL (GAUZE/BANDAGES/DRESSINGS) ×3 IMPLANT
GAUZE SPONGE 4X4 16PLY XRAY LF (GAUZE/BANDAGES/DRESSINGS) ×3 IMPLANT
GLOVE BIO SURGEON STRL SZ7 (GLOVE) ×3 IMPLANT
GLOVE BIOGEL PI IND STRL 7.0 (GLOVE) IMPLANT
GLOVE BIOGEL PI IND STRL 7.5 (GLOVE) ×1 IMPLANT
GLOVE BIOGEL PI IND STRL 8 (GLOVE) IMPLANT
GLOVE BIOGEL PI INDICATOR 7.0 (GLOVE) ×2
GLOVE BIOGEL PI INDICATOR 7.5 (GLOVE) ×2
GLOVE BIOGEL PI INDICATOR 8 (GLOVE) ×2
GLOVE SURG SS PI 7.5 STRL IVOR (GLOVE) ×2 IMPLANT
GOWN STRL REUS W/ TWL LRG LVL3 (GOWN DISPOSABLE) ×2 IMPLANT
GOWN STRL REUS W/TWL LRG LVL3 (GOWN DISPOSABLE) ×6
KIT BASIN OR (CUSTOM PROCEDURE TRAY) ×3 IMPLANT
KIT ROOM TURNOVER OR (KITS) ×3 IMPLANT
MESH PARIETEX PROGRIP LEFT (Mesh General) ×2 IMPLANT
NDL HYPO 25GX1X1/2 BEV (NEEDLE) ×1 IMPLANT
NEEDLE HYPO 25GX1X1/2 BEV (NEEDLE) ×3 IMPLANT
NS IRRIG 1000ML POUR BTL (IV SOLUTION) ×3 IMPLANT
PACK SURGICAL SETUP 50X90 (CUSTOM PROCEDURE TRAY) ×3 IMPLANT
PAD ARMBOARD 7.5X6 YLW CONV (MISCELLANEOUS) ×3 IMPLANT
PENCIL BUTTON HOLSTER BLD 10FT (ELECTRODE) ×3 IMPLANT
SPONGE INTESTINAL PEANUT (DISPOSABLE) ×3 IMPLANT
STRIP CLOSURE SKIN 1/2X4 (GAUZE/BANDAGES/DRESSINGS) ×2 IMPLANT
SUT MNCRL AB 4-0 PS2 18 (SUTURE) ×3 IMPLANT
SUT PDS AB 0 CT 36 (SUTURE) IMPLANT
SUT SILK 2 0 SH (SUTURE) IMPLANT
SUT SILK 3 0 (SUTURE)
SUT SILK 3-0 18XBRD TIE 12 (SUTURE) IMPLANT
SUT VIC AB 0 CT2 27 (SUTURE) ×3 IMPLANT
SUT VIC AB 2-0 SH 27 (SUTURE) ×3
SUT VIC AB 2-0 SH 27X BRD (SUTURE) ×1 IMPLANT
SUT VIC AB 3-0 SH 27 (SUTURE) ×3
SUT VIC AB 3-0 SH 27XBRD (SUTURE) ×1 IMPLANT
SYR CONTROL 10ML LL (SYRINGE) ×3 IMPLANT
TOWEL OR 17X24 6PK STRL BLUE (TOWEL DISPOSABLE) ×1 IMPLANT
TOWEL OR 17X26 10 PK STRL BLUE (TOWEL DISPOSABLE) ×3 IMPLANT

## 2015-10-10 NOTE — Op Note (Signed)
Hernia, Open, Procedure Note  Indications: The patient presented with a history of a left, reducible inguinal hernia.    Pre-operative Diagnosis: left reducible inguinal hernia Post-operative Diagnosis: same  Surgeon: Maia Petties.   Assistants: none  Anesthesia: General endotracheal anesthesia  ASA Class: 2  Procedure Details  The patient was seen again in the Holding Room. The risks, benefits, complications, treatment options, and expected outcomes were discussed with the patient. The possibilities of reaction to medication, pulmonary aspiration, perforation of viscus, bleeding, recurrent infection, the need for additional procedures, and development of a complication requiring transfusion or further operation were discussed with the patient and/or family. The likelihood of success in repairing the hernia and returning the patient to their previous functional status is good.  There was concurrence with the proposed plan, and informed consent was obtained. The site of surgery was properly noted/marked. The patient was taken to the Operating Room, identified as Nicholas Sutton, and the procedure verified as left inguinal hernia repair. A Time Out was held and the above information confirmed.  The patient was placed in the supine position and underwent induction of anesthesia. The lower abdomen and groin was prepped with Chloraprep and draped in the standard fashion, and 0.25% Marcaine with epinephrine was used to anesthetize the skin over the mid-portion of the inguinal canal. He has a previous incision in his left groin, although he does not give a history of any type of left inguinal hernia repair.  He did have a bladder procedure in the past.  An oblique incision was made. Dissection was carried down through the subcutaneous tissue with cautery to the external oblique fascia.  There is some scarring in the subcutaneous tissue, but no sign of mesh was noted.  We opened the external oblique  fascia along the direction of its fibers to the external ring.  The spermatic cord was circumferentially dissected bluntly and retracted with a Penrose drain.  The ilioinguinal nerve was identified and preserved.  The floor of the inguinal canal was inspected and was intact.  We skeletonized the spermatic cord and reduced a moderate-sized indirect hernia sac.  The internal ring was tightened with 0 Vicryl. We cleared a space behind the external oblique fascia.  We used a left sided Progrip mesh which was inserted and deployed across the floor of the inguinal canal. The mesh was tucked underneath the external oblique fascia laterally.  The flap of the mesh was closed around the spermatic cord to recreate the internal inguinal ring.  The mesh was secured to the pubic tubercle with 0 Vicryl.  We used additional sutures to close the flap of the mesh. We also secured the mesh to the shelving edge inferiorly with 0 Vicryl. The external oblique fascia was reapproximated with 2-0 Vicryl.  3-0 Vicryl was used to close the subcutaneous tissues and 4-0 Monocryl was used to close the skin in subcuticular fashion.  Benzoin and steri-strips were used to seal the incision.  A clean dressing was applied.  The patient was then extubated and brought to the recovery room in stable condition.  All sponge, instrument, and needle counts were correct prior to closure and at the conclusion of the case.   Estimated Blood Loss: Minimal                 Complications: None; patient tolerated the procedure well.         Disposition: PACU - hemodynamically stable.         Condition: stable  Imogene Burn. Georgette Dover, MD, [facility] Surgery  General/ Trauma Surgery  10/10/2015 9:43 AM

## 2015-10-10 NOTE — H&P (View-Only) (Signed)
  History of Present Illness Imogene Burn. Adia Crammer MD; 09/11/2015 4:41 PM) The patient is a 70 year old male who presents with an inguinal hernia. This is a patient of Dr. Lear Ng who presents for a third opinion on his left inguinal hernia. 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 He has a remote history of open right inguinal hernia repair. Just after Thanksgiving 2015 while doing heavy work, he felt some discomfort in both groins. The following day he sneezed and felt a pop in his left groin and had some discomfort and what he describes as a definite lump in the left groin. He was examined by Dr. Excell Seltzer last January, but no obvious hernia was felt at that time. Over the last year, he has developed a visible bulge in his left groin. It remains reducible. He denies any GI symptoms. He was seen by Dr. Aviva Signs in [place] for possible repair but had some cardiac issues prior to surgery. He was seen by Dr. Hamilton Capri in [place], [place] who has apparently cleared him for surgery. He has another follow-up with him on Thursday of this week. We do not have any records from the cardiologist. He presents now to discuss surgical repair.   Allergies Elbert Ewings, [place]; 09/11/2015 2:24 PM) No Known Drug Allergies 07/26/2014  Medication History Elbert Ewings, [place]; 09/11/2015 2:24 PM) Metoprolol Tartrate (25MG  Tablet, Oral) Active. Aspirin (325MG  Tablet, Oral) Active. Medications Reconciled    Vitals Elbert Ewings CMA; 09/11/2015 2:25 PM) 09/11/2015 2:24 PM Weight: 187.8 lb Height: 72in Body Surface Area: 2.07 m Body Mass Index: 25.47 kg/m  Temp.: 97.47F(Temporal)  Pulse: 97 (Regular)  BP: 130/76 (Sitting, Left Arm, Standard)      Physical Exam [name] K. Rahim Astorga MD; 09/11/2015 4:42 PM)  The physical exam findings are as follows: Note:WDWN in NAD HEENT: EOMI, sclera anicteric Neck: No masses, no thyromegaly Lungs: CTA  bilaterally; normal respiratory effort CV: Regular rate and rhythm; no murmurs Abd: +bowel sounds, soft, non-tender, no masses Healed lower midline incision - no sign of hernia No right inguinal hernia Visible reducible left inguinal hernia GU: no testicular masses Ext: Well-perfused; no edema Skin: Warm, dry; no sign of jaundice    Assessment & Plan [name] K. Gigi Onstad MD; 09/11/2015 4:43 PM)  INGUINAL HERNIA, LEFT (K40.90)  Current Plans Schedule for Surgery - Left inguinal hernia repair with mesh. The surgical procedure has been discussed with the patient. He is not a candidate for laparoscopic preperitoneal repair because of his previous colon surgery. Potential risks, benefits, alternative treatments, and expected outcomes have been explained. All of the patient's questions at this time have been answered. The likelihood of reaching the patient's treatment goal is good. The patient understand the proposed surgical procedure and wishes to proceed. Pt Education - Pamphlet Given - Hernia Surgery: discussed with patient and provided information.  Imogene Burn. Georgette Dover, MD, [facility] Surgery  General/ Trauma Surgery  09/11/2015 4:43 PM

## 2015-10-10 NOTE — Transfer of Care (Signed)
Immediate Anesthesia Transfer of Care Note  Patient: Nicholas Sutton  Procedure(s) Performed: Procedure(s): LEFT INGUINAL HERNIA REPAIR  (Left) INSERTION OF MESH (Left)  Patient Location: PACU  Anesthesia Type:GA combined with regional for post-op pain  Level of Consciousness: awake and alert   Airway & Oxygen Therapy: Patient Spontanous Breathing and Patient connected to nasal cannula oxygen  Post-op Assessment: Report given to RN, Post -op Vital signs reviewed and stable and Patient moving all extremities  Post vital signs: Reviewed and stable  Last Vitals:  Filed Vitals:   10/10/15 0706 10/10/15 0717  BP: 110/78   Pulse: 5 85  Temp: 37.1 C   Resp: 18     Complications: No apparent anesthesia complications

## 2015-10-10 NOTE — Anesthesia Postprocedure Evaluation (Signed)
Anesthesia Post Note  Patient: Nicholas Sutton  Procedure(s) Performed: Procedure(s) (LRB): LEFT INGUINAL HERNIA REPAIR  (Left) INSERTION OF MESH (Left)  Patient location during evaluation: PACU Anesthesia Type: General and Regional Level of consciousness: awake and alert Pain management: pain level controlled Vital Signs Assessment: post-procedure vital signs reviewed and stable Respiratory status: spontaneous breathing, nonlabored ventilation, respiratory function stable and patient connected to nasal cannula oxygen Cardiovascular status: blood pressure returned to baseline and stable Postop Assessment: no signs of nausea or vomiting Anesthetic complications: no    Last Vitals:  Filed Vitals:   10/10/15 1242 10/10/15 1310  BP: 126/84 117/80  Pulse: 74 84  Temp:    Resp: 16 16    Last Pain:  Filed Vitals:   10/10/15 1311  PainSc: 5                  Tiajuana Amass

## 2015-10-10 NOTE — Discharge Instructions (Signed)
[facility] _______[facility] ° °INGUINAL HERNIA REPAIR: POST OP INSTRUCTIONS ° °Always review your discharge instruction sheet given to you by the facility where your surgery was performed. °IF YOU HAVE DISABILITY OR FAMILY LEAVE FORMS, YOU MUST BRING THEM TO THE OFFICE FOR PROCESSING.   °DO NOT GIVE THEM TO YOUR DOCTOR. ° °1. A  prescription for pain medication may be given to you upon discharge.  Take your pain medication as prescribed, if needed.  If narcotic pain medicine is not needed, then you may take acetaminophen (Tylenol) or ibuprofen (Advil) as needed. °2. Take your usually prescribed medications unless otherwise directed. °3. If you need a refill on your pain medication, please contact your pharmacy.  They will contact our office to request authorization. Prescriptions will not be filled after 5 pm or on week-ends. °4. You should follow a light diet the first 24 hours after arrival home, such as soup and crackers, etc.  Be sure to include lots of fluids daily.  Resume your normal diet the day after surgery. °5. Most patients will experience some swelling and bruising around the umbilicus or in the groin and scrotum.  Ice packs and reclining will help.  Swelling and bruising can take several days to resolve.  °6. It is common to experience some constipation if taking pain medication after surgery.  Increasing fluid intake and taking a stool softener (such as Colace) will usually help or prevent this problem from occurring.  A mild laxative (Milk of Magnesia or Miralax) should be taken according to package directions if there are no bowel movements after 48 hours. °7. Unless discharge instructions indicate otherwise, you may remove your bandages 24-48 hours after surgery, and you may shower at that time.  You may have steri-strips (small skin tapes) in place directly over the incision.  These strips should be left on the skin for 7-10 days.  If your surgeon used skin glue on the incision, you  may shower in 24 hours.  The glue will flake off over the next 2-3 weeks.  Any sutures or staples will be removed at the office during your follow-up visit. °8. ACTIVITIES:  You may resume regular (light) daily activities beginning the next day--such as daily self-care, walking, climbing stairs--gradually increasing activities as tolerated.  You may have sexual intercourse when it is comfortable.  Refrain from any heavy lifting or straining until approved by your doctor. °a. You may drive when you are no longer taking prescription pain medication, you can comfortably wear a seatbelt, and you can safely maneuver your car and apply brakes. °b. RETURN TO WORK:  __________________________________________________________ °9. You should see your doctor in the office for a follow-up appointment approximately 2-3 weeks after your surgery.  Make sure that you call for this appointment within a day or two after you arrive home to insure a convenient appointment time. °10. OTHER INSTRUCTIONS:  __________________________________________________________________________________________________________________________________________________________________________________________  °WHEN TO CALL YOUR DOCTOR: °1. Fever over 101.0 °2. Inability to urinate °3. Nausea and/or vomiting °4. Extreme swelling or bruising °5. Continued bleeding from incision. °6. Increased pain, redness, or drainage from the incision ° °The clinic staff is available to answer your questions during regular business hours.  Please don’t hesitate to call and ask to speak to one of the nurses for clinical concerns.  If you have a medical emergency, go to the nearest emergency room or call 911.  A surgeon from [facility] is always on call at the hospital ° ° °[address], [address], [place], [place]  [address] ? ° [address] [address], [place], [place]   [address] °[phone] ? [phone] ? FAX [phone] °Web site:  [email or web address] ° °

## 2015-10-10 NOTE — Interval H&P Note (Signed)
History and Physical Interval Note:  10/10/2015 7:42 AM  Nicholas Sutton  has presented today for surgery, with the diagnosis of Left inguinal hernia  The various methods of treatment have been discussed with the patient and family. After consideration of risks, benefits and other options for treatment, the patient has consented to  Procedure(s): LEFT INGUINAL HERNIA REPAIR  (Left) INSERTION OF MESH (Left) as a surgical intervention .  The patient's history has been reviewed, patient examined, no change in status, stable for surgery.  I have reviewed the patient's chart and labs.  Questions were answered to the patient's satisfaction.     [name]

## 2015-10-10 NOTE — Anesthesia Procedure Notes (Addendum)
Anesthesia Regional Block:  TAP block  Pre-Anesthetic Checklist: ,, timeout performed, Correct Patient, Correct Site, Correct Laterality, Correct Procedure, Correct Position, site marked, Risks and benefits discussed,  Surgical consent,  Pre-op evaluation,  At surgeon's request and post-op pain management  Laterality: Left  Prep: chloraprep       Needles:  Injection technique: Single-shot  Needle Type: Echogenic Needle     Needle Length: 10cm 10 cm Needle Gauge: 21 and 21 G    Additional Needles:  Procedures: ultrasound guided (picture in chart) TAP block Narrative:  Start time: 10/10/2015 8:00 AM End time: 10/10/2015 8:07 AM Injection made incrementally with aspirations every 5 mL.  Performed by: Personally  Anesthesiologist: Suzette Battiest  Additional Notes: Risks and benefits discussed. Pt tolerated well with no immediate complications.   Procedure Name: Intubation Date/Time: 10/10/2015 8:39 AM Performed by: Suzy Bouchard Pre-anesthesia Checklist: Patient identified, Timeout performed, Suction available, Emergency Drugs available and Patient being monitored Patient Re-evaluated:Patient Re-evaluated prior to inductionOxygen Delivery Method: Circle system utilized Preoxygenation: Pre-oxygenation with 100% oxygen Intubation Type: IV induction Ventilation: Mask ventilation without difficulty Laryngoscope Size: Miller and 2 Grade View: Grade I Tube type: Oral Tube size: 7.5 mm Number of attempts: 1 Airway Equipment and Method: Stylet and LTA kit utilized Placement Confirmation: ETT inserted through vocal cords under direct vision,  positive ETCO2 and breath sounds checked- equal and bilateral Secured at: 21 cm Tube secured with: Tape Dental Injury: Teeth and Oropharynx as per pre-operative assessment

## 2015-10-11 ENCOUNTER — Encounter (HOSPITAL_COMMUNITY): Payer: Self-pay | Admitting: Surgery

## 2016-08-04 ENCOUNTER — Encounter (HOSPITAL_COMMUNITY): Payer: Self-pay | Admitting: Nurse Practitioner

## 2016-08-04 ENCOUNTER — Ambulatory Visit (HOSPITAL_COMMUNITY)
Admission: RE | Admit: 2016-08-04 | Discharge: 2016-08-04 | Disposition: A | Discharge status: Home / Self Care | Payer: Medicare Other | Source: Ambulatory Visit | Attending: Nurse Practitioner | Admitting: Nurse Practitioner

## 2016-08-04 VITALS — BP 142/80 | HR 94 | Ht 72.0 in | Wt 178.0 lb

## 2016-08-04 DIAGNOSIS — Z7982 Long term (current) use of aspirin: Secondary | ICD-10-CM | POA: Diagnosis not present

## 2016-08-04 DIAGNOSIS — Z79899 Other long term (current) drug therapy: Secondary | ICD-10-CM | POA: Diagnosis not present

## 2016-08-04 DIAGNOSIS — I350 Nonrheumatic aortic (valve) stenosis: Secondary | ICD-10-CM | POA: Insufficient documentation

## 2016-08-04 DIAGNOSIS — Z87891 Personal history of nicotine dependence: Secondary | ICD-10-CM | POA: Insufficient documentation

## 2016-08-04 DIAGNOSIS — I482 Chronic atrial fibrillation, unspecified: Secondary | ICD-10-CM

## 2016-08-04 MED ORDER — DILTIAZEM HCL ER COATED BEADS 120 MG PO CP24: 120.0000 mg | ORAL_CAPSULE | Freq: Every day | ORAL | 6 refills | Status: DC

## 2016-08-04 NOTE — Patient Instructions (Signed)
Your physician has recommended you make the following change in your medication:  1)Stop metoprolol 2)Start Cardizem 120mg  once a day

## 2016-08-04 NOTE — Progress Notes (Signed)
Primary Care Physician: Purvis Kilts, MD Referring Physician: self referred    Nicholas Sutton is a 71 y.o. male with a h/o afib  in the afib clinic to get established, He had been seeing Dr.  Hamilton Capri in [place] who has now moved to [facility]. Apparently, he was first seen by Dr. Rayann Heman in 2014, for pre op clearance when he was found to be in asymptomatic afib and duration was unknown. He has continued in afib, rate controlled. He is on asa 325 mg for CHA2DS2VASc score of 1 for age. He has mild aortic stenosis by echo here in 2014. He did have another echo by Dr. Hamilton Capri about 2 years ago. Records have been requested byut not yet received. He is on metoprolol for rate control and is c/o some fatigue that he contributes to the drug. He does snore and has some daytime somnolence. Never had a sleep study. No significant alcohol, caffeine. No tobacco.  Today, he denies symptoms of palpitations, chest pain, shortness of breath, orthopnea, PND, lower extremity edema, dizziness, presyncope, syncope, or neurologic sequela. The patient is tolerating medications without difficulties and is otherwise without complaint today.   Past Medical History:  Diagnosis Date  . Anxiety   . Atrial fibrillation ([place])   . Diarrhea   . Diverticulitis   . 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 Headache(784.0)    x 3 - 4 per year  . Heart murmur    has had no problems    Past Surgical History:  Procedure Laterality Date  . APPENDECTOMY    . COLON RESECTION    . COLOSTOMY REVISION N/A 07/06/2013   Procedure: COLON RESECTION SIGMOID;  Surgeon: Edward Jolly, MD;  Location: [facility];  Service: General;  Laterality: N/A;  . FISTULOTOMY N/A 07/06/2013   Procedure: repair of colovesicle fistula/ FISTULOTOMY;  Surgeon: Edward Jolly, MD;  Location: [facility];  Service: General;  Laterality: N/A;  . GUM SURGERY    . HERNIA REPAIR      . INGUINAL HERNIA REPAIR Left 10/10/2015   Procedure: LEFT INGUINAL HERNIA REPAIR ;  Surgeon: Donnie Mesa, MD;  Location: [place];  Service: General;  Laterality: Left;  . INSERTION OF MESH Left 10/10/2015   Procedure: INSERTION OF MESH;  Surgeon: Donnie Mesa, MD;  Location: [facility];  Service: General;  Laterality: Left;    Current Outpatient Prescriptions  Medication Sig Dispense Refill  . acetaminophen (TYLENOL) 500 MG tablet Take 1,000 mg by mouth every 6 (six) hours as needed for headache.    [name] aspirin EC 325 MG tablet Take 325 mg by mouth daily.    . cetirizine (ZYRTEC) 10 MG tablet Take 10 mg by mouth at bedtime.     [name] diltiazem (CARDIZEM CD) 120 MG 24 hr capsule Take 1 capsule (120 mg total) by mouth daily. 30 capsule 6   No current facility-administered medications for this encounter.     Allergies  Allergen Reactions  . Dust Mite Extract Other (See Comments)    Sneezing  . Pollen Extract Other (See Comments)    Sneezing    Social History   Social History  . Marital status: Divorced    Spouse name: N/A  . Number of children: N/A  . Years of education: N/A   Occupational History  . Not on file.   Social History Main Topics  . Smoking status: Former Smoker  Packs/day: 1.50    Years: 30.00    Types: Cigarettes    Quit date: 06/03/2008  . Smokeless tobacco: Never Used  . Alcohol use No     Comment: rare  . Drug use: No  . Sexual activity: No   Other Topics Concern  . Not on file   Social History Narrative  . No narrative on file    Family History  Problem Relation Age of Onset  . Hypertension      ROS- All systems are reviewed and negative except as per the HPI above  Physical Exam: Vitals:   08/04/16 1441  BP: (!) 142/80  Pulse: 94  Weight: 178 lb (80.7 kg)  Height: 6' (1.829 m)   Wt Readings from Last 3 Encounters:  08/04/16 178 lb (80.7 kg)  10/10/15 185 lb (83.9 kg)  10/03/15 185 lb 6.5 oz (84.1 kg)    Labs: Lab Results  Component  Value Date   NA 140 10/03/2015   K 4.7 10/03/2015   CL 101 10/03/2015   CO2 26 10/03/2015   GLUCOSE 97 10/03/2015   BUN 16 10/03/2015   CREATININE 1.09 10/03/2015   CALCIUM 10.0 10/03/2015   No results found for: INR No results found for: CHOL, HDL, LDLCALC, TRIG   GEN- The patient is well appearing, alert and oriented x 3 today.   Head- normocephalic, atraumatic Eyes-  Sclera clear, conjunctiva pink Ears- hearing intact Oropharynx- clear Neck- supple, no JVP Lymph- no cervical lymphadenopathy Lungs- Clear to ausculation bilaterally, normal work of breathing Heart- irregular rate and rhythm, no murmurs, rubs or gallops, PMI not laterally displaced GI- soft, NT, ND, + BS Extremities- no clubbing, cyanosis, or edema MS- no significant deformity or atrophy Skin- no rash or lesion Psych- euthymic mood, full affect Neuro- strength and sensation are intact  EKG-afib rate controlled at 94 bpm, qrs int 90 ms, qtc 415 ms Epic records reviewed    Assessment and Plan: 1. Chronic asymptomatic afib Is rate controlled but thinks BB may be contributing to fatigue Stop metoprolol and start cardizem 120 mg qd to see if fatigue may be improved Continue asa for  CHA2DS2-VASc Score of 1. Sleep study for h/o snoring and fatigue  2. Mild aortic stenosis Repeat echo    F/u in afib clinic after echo  Butch Penny C. Ruger Saxer, [facility] [address] [place], [place] [address] [phone]

## 2016-08-05 DIAGNOSIS — Z681 Body mass index (BMI) 19 or less, adult: Secondary | ICD-10-CM | POA: Diagnosis not present

## 2016-08-05 DIAGNOSIS — T07XXXA Unspecified multiple injuries, initial encounter: Secondary | ICD-10-CM | POA: Diagnosis not present

## 2016-08-05 DIAGNOSIS — Z1389 Encounter for screening for other disorder: Secondary | ICD-10-CM | POA: Diagnosis not present

## 2016-08-05 DIAGNOSIS — S70369A Insect bite (nonvenomous), unspecified thigh, initial encounter: Secondary | ICD-10-CM | POA: Diagnosis not present

## 2016-08-08 ENCOUNTER — Other Ambulatory Visit: Payer: Self-pay | Admitting: *Deleted

## 2016-08-08 ENCOUNTER — Ambulatory Visit (HOSPITAL_COMMUNITY)
Admission: RE | Admit: 2016-08-08 | Discharge: 2016-08-08 | Disposition: A | Discharge status: Home / Self Care | Payer: Medicare Other | Source: Ambulatory Visit | Attending: Nurse Practitioner | Admitting: Nurse Practitioner

## 2016-08-08 DIAGNOSIS — I482 Chronic atrial fibrillation, unspecified: Secondary | ICD-10-CM

## 2016-08-08 DIAGNOSIS — G4733 Obstructive sleep apnea (adult) (pediatric): Secondary | ICD-10-CM

## 2016-08-08 LAB — ECHOCARDIOGRAM COMPLETE
AOPV: 0.32 m/s
AOVTI: 55.7 cm
AV Area VTI index: 0.52 cm2/m2
AV Area VTI: 1.1 cm2
AV Peak grad: 29 mmHg
AV VEL mean LVOT/AV: 0.29
AV peak Index: 0.54
AV pk vel: 271 cm/s
AVAREAMEANV: 1 cm2
AVAREAMEANVIN: 0.49 cm2/m2
AVG: 17 mmHg
CHL CUP AV VEL: 1.06
DOP CAL AO MEAN VELOCITY: 194 cm/s
FS: 28 % (ref 28–44)
IVS/LV PW RATIO, ED: 0.91
LA diam end sys: 36 mm
LA diam index: 1.77 cm/m2
LA vol A4C: 74.8 ml
LA vol index: 32.4 mL/m2
LASIZE: 36 mm
LAVOL: 65.8 mL
LVOT SV: 59 mL
LVOT VTI: 17 cm
LVOT area: 3.46 cm2
LVOT diameter: 21 mm
LVOT peak VTI: 0.31 cm
LVOT peak vel: 85.8 cm/s
PW: 8.79 mm — AB (ref 0.6–1.1)
TAPSE: 15.6 mm
Valve area index: 0.52
Valve area: 1.06 cm2

## 2016-08-08 NOTE — Progress Notes (Signed)
  Echocardiogram 2D Echocardiogram has been performed.  Nicholas Sutton 08/08/2016, 3:40 PM

## 2016-08-14 ENCOUNTER — Inpatient Hospital Stay (HOSPITAL_COMMUNITY): Admission: RE | Admit: 2016-08-14 | Payer: Self-pay | Source: Ambulatory Visit | Admitting: Nurse Practitioner

## 2016-08-18 ENCOUNTER — Ambulatory Visit (HOSPITAL_COMMUNITY)
Admission: RE | Admit: 2016-08-18 | Discharge: 2016-08-18 | Disposition: A | Discharge status: Home / Self Care | Payer: Medicare Other | Source: Ambulatory Visit | Attending: Nurse Practitioner | Admitting: Nurse Practitioner

## 2016-08-18 ENCOUNTER — Encounter (HOSPITAL_COMMUNITY): Payer: Self-pay | Admitting: Nurse Practitioner

## 2016-08-18 VITALS — BP 108/78 | HR 92 | Ht 72.0 in | Wt 181.0 lb

## 2016-08-18 DIAGNOSIS — Z87891 Personal history of nicotine dependence: Secondary | ICD-10-CM | POA: Diagnosis not present

## 2016-08-18 DIAGNOSIS — I519 Heart disease, unspecified: Secondary | ICD-10-CM | POA: Diagnosis not present

## 2016-08-18 DIAGNOSIS — I4891 Unspecified atrial fibrillation: Secondary | ICD-10-CM | POA: Diagnosis not present

## 2016-08-18 DIAGNOSIS — Z7982 Long term (current) use of aspirin: Secondary | ICD-10-CM | POA: Diagnosis not present

## 2016-08-18 DIAGNOSIS — I350 Nonrheumatic aortic (valve) stenosis: Secondary | ICD-10-CM | POA: Diagnosis not present

## 2016-08-18 DIAGNOSIS — I482 Chronic atrial fibrillation, unspecified: Secondary | ICD-10-CM

## 2016-08-18 DIAGNOSIS — Z79899 Other long term (current) drug therapy: Secondary | ICD-10-CM | POA: Diagnosis not present

## 2016-08-18 NOTE — Progress Notes (Signed)
Primary Care Physician: Purvis Kilts, MD Referring Physician: self referred    Nicholas Sutton is a 70 y.o. male with a h/o afib  in the afib clinic to get established, He had been seeing Dr.  Hamilton Capri in [place] who has now moved to [facility]. Apparently, he was first seen by Dr. Rayann Heman in 2014, for pre op clearance when he was found to be in asymptomatic afib and duration was unknown. He has continued in afib, rate controlled. He is on asa 325 mg for CHA2DS2VASc score of 1 for age. He has mild aortic stenosis by echo here in 2014. He did have another echo by Dr. Hamilton Capri about 2 years ago. Records have been requested byut not yet received. He is on metoprolol for rate control and is c/o some fatigue that he contributes to the drug. He does snore and has some daytime somnolence. Never had a sleep study. No significant alcohol, caffeine. No tobacco.  F/u in afib clinic 1/29 for f/u of repeat echo.  Today, he denies symptoms of palpitations, chest pain, shortness of breath, orthopnea, PND, lower extremity edema, dizziness, presyncope, syncope, or neurologic sequela. The patient is tolerating medications without difficulties and is otherwise without complaint today.   Past Medical History:  Diagnosis Date  . Anxiety   . Atrial fibrillation ([place])   . Diarrhea   . Diverticulitis   . 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 Headache(784.0)    x 3 - 4 per year  . Heart murmur    has had no problems    Past Surgical History:  Procedure Laterality Date  . APPENDECTOMY    . COLON RESECTION    . COLOSTOMY REVISION N/A 07/06/2013   Procedure: COLON RESECTION SIGMOID;  Surgeon: Edward Jolly, MD;  [facility]: [facility];  Service: General;  Laterality: N/A;  . FISTULOTOMY N/A 07/06/2013   Procedure: repair of colovesicle fistula/ FISTULOTOMY;  Surgeon: Edward Jolly, MD;  Location: [facility];  Service: General;   Laterality: N/A;  . GUM SURGERY    . HERNIA REPAIR    . INGUINAL HERNIA REPAIR Left 10/10/2015   Procedure: LEFT INGUINAL HERNIA REPAIR ;  Surgeon: Donnie Mesa, MD;  Location: [facility];  Service: General;  Laterality: Left;  . INSERTION OF MESH Left 10/10/2015   Procedure: INSERTION OF MESH;  Surgeon: Donnie Mesa, MD;  Location: [facility];  Service: General;  Laterality: Left;    Current Outpatient Prescriptions  Medication Sig Dispense Refill  . acetaminophen (TYLENOL) 500 MG tablet Take 1,000 mg by mouth every 6 (six) hours as needed for headache.    [name] aspirin EC 325 MG tablet Take 325 mg by mouth daily.    . cetirizine (ZYRTEC) 10 MG tablet Take 10 mg by mouth at bedtime.     [name] diltiazem (CARDIZEM CD) 120 MG 24 hr capsule Take 1 capsule (120 mg total) by mouth daily. 30 capsule 6   No current facility-administered medications for this encounter.     Allergies  Allergen Reactions  . Dust Mite Extract Other (See Comments)    Sneezing  . Pollen Extract Other (See Comments)    Sneezing    Social History   Social History  . Marital status: Divorced    Spouse name: N/A  . Number of children: N/A  . Years of education: N/A   Occupational History  . Not on file.   Social History Main  Topics  . Smoking status: Former Smoker    Packs/day: 1.50    Years: 30.00    Types: Cigarettes    Quit date: 06/03/2008  . Smokeless tobacco: Never Used  . Alcohol use No     Comment: rare  . Drug use: No  . Sexual activity: No   Other Topics Concern  . Not on file   Social History Narrative  . No narrative on file    Family History  Problem Relation Age of Onset  . Hypertension      ROS- All systems are reviewed and negative except as per the HPI above  Physical Exam: Vitals:   08/18/16 0900  BP: 108/78  Pulse: 92  Weight: 181 lb (82.1 kg)  Height: 6' (1.829 m)   Wt Readings from Last 3 Encounters:  08/18/16 181 lb (82.1 kg)  08/04/16 178 lb (80.7 kg)  10/10/15 185 lb  (83.9 kg)    Labs: Lab Results  Component Value Date   NA 140 10/03/2015   K 4.7 10/03/2015   CL 101 10/03/2015   CO2 26 10/03/2015   GLUCOSE 97 10/03/2015   BUN 16 10/03/2015   CREATININE 1.09 10/03/2015   CALCIUM 10.0 10/03/2015   No results found for: INR No results found for: CHOL, HDL, LDLCALC, TRIG   GEN- The patient is well appearing, alert and oriented x 3 today.   Head- normocephalic, atraumatic Eyes-  Sclera clear, conjunctiva pink Ears- hearing intact Oropharynx- clear Neck- supple, no JVP Lymph- no cervical lymphadenopathy Lungs- Clear to ausculation bilaterally, normal work of breathing Heart- irregular rate and rhythm, no murmurs, rubs or gallops, PMI not laterally displaced GI- soft, NT, ND, + BS Extremities- no clubbing, cyanosis, or edema MS- no significant deformity or atrophy Skin- no rash or lesion Psych- euthymic mood, full affect Neuro- strength and sensation are intact  EKG-afib rate controlled at 94 bpm, qrs int 90 ms, qtc 415 ms Epic records reviewed Echo- 08/08/16-Left ventricle: The cavity size was normal. Wall thickness was   normal. Systolic function was moderately reduced. The estimated   ejection fraction was in the range of 35% to 40%. Wall motion was   normal; there were no regional wall motion abnormalities. - Aortic valve: There was mild stenosis. There was mild   regurgitation. Valve area (VTI): 1.06 cm^2. Valve area (Vmax):   1.1 cm^2. Valve area (Vmean): 1 cm^2. - Right ventricle: The cavity size was mildly dilated.   Assessment and Plan: 1. Chronic asymptomatic afib Is rate controlled but thinks BB may be contributing to fatigue On last visit metoprolol was stopped to see if cardizem 120 mg qd was tolerated better Continue asa for  CHA2DS2-VASc Score of 1. Sleep study for h/o snoring and fatigue was recommended which pt states that he does not want to do a sleep study at this time  2. Mild aortic stenosis Stable by  echo  3. LV dysfunction New since echo of 2014 Have not received records from [facility] although they have been requested x several weeks to see EF on last echo has changed Will obtain a stress myoview to look for silent CAD, pt prefers to walk vrs lexiscan Consider a monitor to  ascertain that he is well rate controlled Will probably need to switch CCB back to BB and add ACE after stress test is known.   F/u in afib clinic after stress test  Butch Penny C. Mila [facility] [place],  [place] [address] [phone]

## 2016-08-25 ENCOUNTER — Telehealth (HOSPITAL_COMMUNITY): Payer: Self-pay | Admitting: *Deleted

## 2016-08-25 NOTE — Telephone Encounter (Signed)
Patient given detailed instructions per Myocardial Perfusion Study Information Sheet for the test on 08/27/16 at 1000. Patient notified to arrive 15 minutes early and that it is imperative to arrive on time for appointment to keep from having the test rescheduled.  If you need to cancel or reschedule your appointment, please call the office within 24 hours of your appointment. Failure to do so may result in a cancellation of your appointment, and a $50 no show fee. Patient verbalized understanding.Rozalynn Buege, [facility]

## 2016-08-26 DIAGNOSIS — Z6824 Body mass index (BMI) 24.0-24.9, adult: Secondary | ICD-10-CM | POA: Diagnosis not present

## 2016-08-26 DIAGNOSIS — R229 Localized swelling, mass and lump, unspecified: Secondary | ICD-10-CM | POA: Diagnosis not present

## 2016-08-27 ENCOUNTER — Ambulatory Visit (HOSPITAL_COMMUNITY): Payer: Medicare Other | Attending: Internal Medicine

## 2016-08-27 DIAGNOSIS — I519 Heart disease, unspecified: Secondary | ICD-10-CM | POA: Diagnosis not present

## 2016-08-27 DIAGNOSIS — I4891 Unspecified atrial fibrillation: Secondary | ICD-10-CM | POA: Diagnosis not present

## 2016-08-27 LAB — MYOCARDIAL PERFUSION IMAGING
CHL CUP NUCLEAR SDS: 3
CHL CUP NUCLEAR SRS: 8
CHL CUP NUCLEAR SSS: 11
LHR: 0.33
LV sys vol: 41 mL
LVDIAVOL: 94 mL (ref 62–150)
NUC STRESS TID: 0.98
Peak HR: 107 {beats}/min
Rest HR: 82 {beats}/min

## 2016-08-27 MED ORDER — REGADENOSON 0.4 MG/5ML IV SOLN
0.4000 mg | Freq: Once | INTRAVENOUS | Status: AC
  Administered 2016-08-27: 0.4 mg via INTRAVENOUS

## 2016-08-27 MED ORDER — TECHNETIUM TC 99M TETROFOSMIN IV KIT
10.3000 | PACK | Freq: Once | INTRAVENOUS | Status: AC | PRN
  Administered 2016-08-27: 10.3 via INTRAVENOUS
  Filled 2016-08-27: qty 11

## 2016-08-27 MED ORDER — TECHNETIUM TC 99M TETROFOSMIN IV KIT
32.5000 | PACK | Freq: Once | INTRAVENOUS | Status: AC | PRN
  Administered 2016-08-27: 32.5 via INTRAVENOUS
  Filled 2016-08-27: qty 33

## 2016-09-01 ENCOUNTER — Ambulatory Visit (HOSPITAL_COMMUNITY)
Admission: RE | Admit: 2016-09-01 | Discharge: 2016-09-01 | Disposition: A | Discharge status: Home / Self Care | Payer: Medicare Other | Source: Ambulatory Visit | Attending: Nurse Practitioner | Admitting: Nurse Practitioner

## 2016-09-01 ENCOUNTER — Encounter (HOSPITAL_COMMUNITY): Payer: Self-pay | Admitting: Nurse Practitioner

## 2016-09-01 ENCOUNTER — Other Ambulatory Visit: Payer: Self-pay

## 2016-09-01 VITALS — BP 118/82 | HR 85 | Ht 72.0 in | Wt 185.2 lb

## 2016-09-01 DIAGNOSIS — I482 Chronic atrial fibrillation, unspecified: Secondary | ICD-10-CM

## 2016-09-01 DIAGNOSIS — I4891 Unspecified atrial fibrillation: Secondary | ICD-10-CM | POA: Insufficient documentation

## 2016-09-01 DIAGNOSIS — I350 Nonrheumatic aortic (valve) stenosis: Secondary | ICD-10-CM | POA: Insufficient documentation

## 2016-09-01 DIAGNOSIS — Z87891 Personal history of nicotine dependence: Secondary | ICD-10-CM | POA: Diagnosis not present

## 2016-09-01 DIAGNOSIS — Z7982 Long term (current) use of aspirin: Secondary | ICD-10-CM | POA: Insufficient documentation

## 2016-09-01 NOTE — Progress Notes (Signed)
Primary Care Physician: Purvis Kilts, MD Referring Physician: self referred  Cardiologist: Previously Dr. Hamilton Capri, [place], [place]   Nicholas Sutton is a 71 y.o. male with a h/o afib  in the afib clinic to get established. He had been seeing Dr.  Hamilton Capri in [place] who has now moved to [facility] Apparently, he was first seen by Dr. Rayann Heman in 2014, for pre op clearance when he was found to be in asymptomatic afib and duration was unknown. He has continued in afib, rate controlled. He is on asa 325 mg for CHA2DS2VASc score of 1 for age. He has mild aortic stenosis by echo here in 2014. He did have another echo by Dr. Hamilton Capri about 2 years ago. Records have been requested byut not yet received. He is on metoprolol for rate control and is c/o some fatigue that he contributes to the drug. He does snore and has some daytime somnolence. Never had a sleep study. No significant alcohol, caffeine. No tobacco.  F/u in afib clinic 1/29 for f/u of repeat echo. EF was reported at 35-40%. Since then echo was received form [place] that showed EF at 40-45%. Aortic stenosis was stable at mild. Due to decrease in EF by Echo, it was decided to schedule a stress test.   Pt returns today 2/12, for results of test. It was low risk but shows a possible old MI with peri infarct ischemia and a small defect of moderate intensity . He currently is not having any ischemic  symptoms but may benefit may a cardiac cath and will be referred to general cardiology. Ef was reported at 57 % at time of stress test.  Today, he denies symptoms of palpitations, chest pain, shortness of breath, orthopnea, PND, lower extremity edema, dizziness, presyncope, syncope, or neurologic sequela. The patient is tolerating medications without difficulties and is otherwise without complaint today.   Past Medical History:  Diagnosis Date  . Anxiety   . Atrial fibrillation ([place])   . Diarrhea   . Diverticulitis   . 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 Headache(784.0)    x 3 - 4 per year  . Heart murmur    has had no problems    Past Surgical History:  Procedure Laterality Date  . APPENDECTOMY    . COLON RESECTION    . COLOSTOMY REVISION N/A 07/06/2013   Procedure: COLON RESECTION SIGMOID;  Surgeon: Edward Jolly, MD;  Location: [facility];  Service: General;  Laterality: N/A;  . FISTULOTOMY N/A 07/06/2013   Procedure: repair of colovesicle fistula/ FISTULOTOMY;  Surgeon: Edward Jolly, MD;  Location: [facility];  Service: General;  Laterality: N/A;  . GUM SURGERY    . HERNIA REPAIR    . INGUINAL HERNIA REPAIR Left 10/10/2015   Procedure: LEFT INGUINAL HERNIA REPAIR ;  Surgeon: Donnie Mesa, MD;  Location: [place];  Service: General;  Laterality: Left;  . INSERTION OF MESH Left 10/10/2015   Procedure: INSERTION OF MESH;  Surgeon: Donnie Mesa, MD;  Location: [place];  Service: General;  Laterality: Left;    Current Outpatient Prescriptions  Medication Sig Dispense Refill  . acetaminophen (TYLENOL) 500 MG tablet Take 1,000 mg by mouth every 6 (six) hours as needed for headache.    [name] aspirin EC 325 MG tablet Take 325 mg by mouth daily.    . cetirizine (ZYRTEC) 10 MG tablet Take 10 mg by mouth at bedtime.     [name]  diltiazem (CARDIZEM CD) 120 MG 24 hr capsule Take 1 capsule (120 mg total) by mouth daily. 30 capsule 6   No current facility-administered medications for this encounter.     Allergies  Allergen Reactions  . Dust Mite Extract Other (See Comments)    Sneezing  . Pollen Extract Other (See Comments)    Sneezing    Social History   Social History  . Marital status: Divorced    Spouse name: N/A  . Number of children: N/A  . Years of education: N/A   Occupational History  . Not on file.   Social History Main Topics  . Smoking status: Former Smoker    Packs/day: 1.50    Years: 30.00    Types: Cigarettes    Quit date: 06/03/2008  .  Smokeless tobacco: Never Used  . Alcohol use No     Comment: rare  . Drug use: No  . Sexual activity: No   Other Topics Concern  . Not on file   Social History Narrative  . No narrative on file    Family History  Problem Relation Age of Onset  . Hypertension      ROS- All systems are reviewed and negative except as per the HPI above  Physical Exam: Vitals:   09/01/16 0949  BP: 118/82  Pulse: 85  Weight: 185 lb 3.2 oz (84 kg)  Height: 6' (1.829 m)   Wt Readings from Last 3 Encounters:  09/01/16 185 lb 3.2 oz (84 kg)  08/27/16 175 lb (79.4 kg)  08/18/16 181 lb (82.1 kg)    Labs: Lab Results  Component Value Date   NA 140 10/03/2015   K 4.7 10/03/2015   CL 101 10/03/2015   CO2 26 10/03/2015   GLUCOSE 97 10/03/2015   BUN 16 10/03/2015   CREATININE 1.09 10/03/2015   CALCIUM 10.0 10/03/2015   No results found for: INR No results found for: CHOL, HDL, LDLCALC, TRIG   GEN- The patient is well appearing, alert and oriented x 3 today.   Head- normocephalic, atraumatic Eyes-  Sclera clear, conjunctiva pink Ears- hearing intact Oropharynx- clear Neck- supple, no JVP Lymph- no cervical lymphadenopathy Lungs- Clear to ausculation bilaterally, normal work of breathing Heart- irregular rate and rhythm, no murmurs, rubs or gallops, PMI not laterally displaced GI- soft, NT, ND, + BS Extremities- no clubbing, cyanosis, or edema MS- no significant deformity or atrophy Skin- no rash or lesion Psych- euthymic mood, full affect Neuro- strength and sensation are intact  EKG-afib rate controlled at 85 bpm, qrs int 90 ms, qtc 416 ms Epic records reviewed Echo- 08/08/16-Left ventricle: The cavity size was normal. Wall thickness was   normal. Systolic function was moderately reduced. The estimated   ejection fraction was in the range of 35% to 40%. Wall motion was   normal; there were no regional wall motion abnormalities. - Aortic valve: There was mild stenosis. There was  mild   regurgitation. Valve area (VTI): 1.06 cm^2. Valve area (Vmax):   1.1 cm^2. Valve area (Vmean): 1 cm^2. - Right ventricle: The cavity size was mildly dilated. Stress myoview-Study Highlights    Nuclear stress EF: 57%.  There was no ST segment deviation noted during stress.  No T wave inversion was noted during stress.  Defect 1: There is a small defect of moderate severity present in the basal inferoseptal and mid inferoseptal location.  Findings consistent with prior myocardial infarction with peri-infarct ischemia.  The left ventricular ejection fraction is normal (  55-65%).  This is a low risk study.      Assessment and Plan: 1. Longstanding asymptomatic afib Is rate controlled At this point,  I see no reason to try to return him to SR  Contnue cardizem 120 mg, as he felt that BB was contributing to fatigue Continue asa for  CHA2DS2-VASc Score of 1. Sleep study for h/o snoring and fatigue was recommended which pt states that he does not want to do a sleep study at this time  2. Mild aortic stenosis Stable by echo Will need to be closely followed for timing of repair  3. LV dysfunction 35-40% by echo but recent stress test, EF normal However, stress test, although read as low risk, shows some abnormalities, possible CAD, that may be best evaluated with cardiac cath Pt is currently without ischemic symptoms  4. Chadsvasc score of 1 Currently on asa but if cad present on cath or if EF is low by that test, he will have a score of 2-3 and will need to be started on anticoagulant    Will refer to Dr. Saunders Revel to establish care and f/u on abnormal myoview and aortic stenosis afib clinic as needed    Butch Penny C. Millena Callins, [facility] [address] [place], [place] [address] [phone]

## 2016-09-18 ENCOUNTER — Encounter: Payer: Self-pay | Admitting: Internal Medicine

## 2016-09-21 ENCOUNTER — Encounter (HOSPITAL_BASED_OUTPATIENT_CLINIC_OR_DEPARTMENT_OTHER): Payer: Self-pay

## 2016-09-25 ENCOUNTER — Other Ambulatory Visit: Payer: Self-pay | Admitting: *Deleted

## 2016-09-25 ENCOUNTER — Encounter: Payer: Self-pay | Admitting: Internal Medicine

## 2016-09-25 ENCOUNTER — Encounter: Payer: Self-pay | Admitting: *Deleted

## 2016-09-25 ENCOUNTER — Ambulatory Visit (INDEPENDENT_AMBULATORY_CARE_PROVIDER_SITE_OTHER): Payer: Medicare Other | Admitting: Internal Medicine

## 2016-09-25 ENCOUNTER — Telehealth: Payer: Self-pay | Admitting: *Deleted

## 2016-09-25 VITALS — BP 104/76 | HR 76 | Ht 72.0 in | Wt 179.4 lb

## 2016-09-25 DIAGNOSIS — E875 Hyperkalemia: Secondary | ICD-10-CM

## 2016-09-25 DIAGNOSIS — I350 Nonrheumatic aortic (valve) stenosis: Secondary | ICD-10-CM | POA: Diagnosis not present

## 2016-09-25 DIAGNOSIS — I482 Chronic atrial fibrillation, unspecified: Secondary | ICD-10-CM

## 2016-09-25 DIAGNOSIS — R9439 Abnormal result of other cardiovascular function study: Secondary | ICD-10-CM | POA: Diagnosis not present

## 2016-09-25 DIAGNOSIS — I429 Cardiomyopathy, unspecified: Secondary | ICD-10-CM

## 2016-09-25 DIAGNOSIS — I509 Heart failure, unspecified: Secondary | ICD-10-CM | POA: Diagnosis not present

## 2016-09-25 LAB — BASIC METABOLIC PANEL
BUN / CREAT RATIO: 18 (ref 10–24)
BUN: 19 mg/dL (ref 8–27)
CO2: 21 mmol/L (ref 18–29)
Calcium: 10 mg/dL (ref 8.6–10.2)
Chloride: 98 mmol/L (ref 96–106)
Creatinine, Ser: 1.04 mg/dL (ref 0.76–1.27)
GFR calc non Af Amer: 73 mL/min/{1.73_m2} (ref >59)
GFR, EST AFRICAN AMERICAN: 85 mL/min/{1.73_m2} (ref >59)
Glucose: 77 mg/dL (ref 65–99)
POTASSIUM: 5.9 mmol/L — AB (ref 3.5–5.2)
SODIUM: 137 mmol/L (ref 134–144)

## 2016-09-25 LAB — CBC WITH DIFFERENTIAL/PLATELET
BASOS ABS: 0 10*3/uL (ref 0.0–0.2)
Basos: 0 %
EOS (ABSOLUTE): 0.3 10*3/uL (ref 0.0–0.4)
Eos: 3 %
HEMOGLOBIN: 15.3 g/dL (ref 13.0–17.7)
Hematocrit: 45.5 % (ref 37.5–51.0)
Immature Grans (Abs): 0 10*3/uL (ref 0.0–0.1)
Immature Granulocytes: 0 %
LYMPHS ABS: 2.2 10*3/uL (ref 0.7–3.1)
Lymphs: 24 %
MCH: 30.5 pg (ref 26.6–33.0)
MCHC: 33.6 g/dL (ref 31.5–35.7)
MCV: 91 fL (ref 79–97)
MONOCYTES: 7 %
Monocytes Absolute: 0.6 10*3/uL (ref 0.1–0.9)
NEUTROS ABS: 6 10*3/uL (ref 1.4–7.0)
Neutrophils: 66 %
Platelets: 226 10*3/uL (ref 150–379)
RBC: 5.01 x10E6/uL (ref 4.14–5.80)
RDW: 13.6 % (ref 12.3–15.4)
WBC: 9.1 10*3/uL (ref 3.4–10.8)

## 2016-09-25 LAB — PROTIME-INR
INR: 1.1 (ref 0.8–1.2)
PROTHROMBIN TIME: 11.5 s (ref 9.1–12.0)

## 2016-09-25 MED ORDER — CARVEDILOL 3.125 MG PO TABS: 3.1250 mg | ORAL_TABLET | Freq: Two times a day (BID) | ORAL | 1 refills | Status: DC

## 2016-09-25 NOTE — Patient Instructions (Signed)
Medication Instructions:  Stop Dilitazem.  Start coreg (carvedilol) 3.125mg  two times a day.  Labwork: BMET/CBCd/PT/INR today  Testing/Procedures: Your physician has requested that you have a cardiac catheterization. Cardiac catheterization is used to diagnose and/or treat various heart conditions. Doctors may recommend this procedure for a number of different reasons. The most common reason is to evaluate chest pain. Chest pain can be a symptom of coronary artery disease (CAD), and cardiac catheterization can show whether plaque is narrowing or blocking your heart's arteries. This procedure is also used to evaluate the valves, as well as measure the blood flow and oxygen levels in different parts of your heart. For further information please visit [email or web address]. Please follow instruction sheet, as given.  Thursday March 22,2018  Follow-Up: Your physician recommends that you schedule a follow-up appointment in: 6 weeks with Dr End.    Any Other Special Instructions Will Be Listed Below (If Applicable).  IF YOUR HEART RATE IS CONSISTENTLY ABOVE 100, CALL OUR OFFICE [phone], DR END MAY WANT TO ADJUST YOUR MEDICATION   If you need a refill on your cardiac medications before your next appointment, please call your pharmacy.

## 2016-09-25 NOTE — Progress Notes (Signed)
New Outpatient Visit Date: 09/25/2016  Referring Provider: Roderic Palau, NP [facility]  Chief Complaint: Cardiomyopathy and abnormal stress test  HPI:  Mr. Oros is a 71 y.o. year-old male with history of chronic atrial fibrillation, who has been referred by Ms. Kayleen Memos for evaluation of cardiomyopathy and abnormal stress test. The patient was last seen in the atrial fibrillation clinic in 08/2016. He had noted some fatigue earlier in the year, prompting echocardiogram. This revealed moderately reduced LVEF. Subsequent myocardial perfusion stress test demonstrated basal and mid inferoseptal infarct and possible peri-infarct ischemia. Patient denies a history of coronary artery disease. He has not had any chest pain, shortness of breath, orthopnea, PND, or lightheadedness. He has rare palpitations when exerting himself but is otherwise unaware of his atrial fibrillation. He also endorses mild fatigue with heavy exertion. He is still able to cut wood with his chain saw and do other strenuous outdoor activities without significant limitation  The patient reports that he diagnosed with atrial fibrillation in 2014 during preoperative evaluation before bowel surgery. He has been maintained on aspirin and diltiazem. He notes being on metoprolol several years ago but reported increasing fatigue when this was increased to 50 mg twice a day.  --------------------------------------------------------------------------------------------------  Cardiovascular History & Procedures: Cardiovascular Problems:  Cardiomyopathy  Chronic atrial fibrillation  Abnormal stress test  Risk Factors:  Male gender, prior tobacco use, and age > 33  Cath/PCI:  None  CV Surgery:  None  EP Procedures and Devices:  None  Non-Invasive Evaluation(s):  Pharmacologic myocardial perfusion stress test (08/27/16): Small in size, moderate in severity, basal and mid inferoseptal defect  consistent with prior MI and peri-infarct ischemia. LVEF 57%.  Transthoracic echocardiogram (08/08/16): Normal LV size and wall thickness. LVEF moderately reduced (EF 35-40%). Normal wall motion. Thickened and calcified aortic valve with mild stenosis and mild regurgitation. Mildly dilated right ventricle with normal contraction.  Recent CV Pertinent Labs: Lab Results  Component Value Date   K 4.7 10/03/2015   BUN 16 10/03/2015   CREATININE 1.09 10/03/2015    --------------------------------------------------------------------------------------------------  Past Medical History:  Diagnosis Date  . Anxiety   . Atrial fibrillation ([place])   . Diarrhea   . Diverticulitis   . 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 Headache(784.0)    x 3 - 4 per year  . Heart murmur    has had no problems     Past Surgical History:  Procedure Laterality Date  . APPENDECTOMY    . COLON RESECTION    . COLOSTOMY REVISION N/A 07/06/2013   Procedure: COLON RESECTION SIGMOID;  Surgeon: Edward Jolly, MD;  [facility];  Service: General;  Laterality: N/A;  . FISTULOTOMY N/A 07/06/2013   Procedure: repair of colovesicle fistula/ FISTULOTOMY;  Surgeon: Edward Jolly, MD;  Location: [facility];  Service: General;  Laterality: N/A;  . GUM SURGERY    . HERNIA REPAIR    . INGUINAL HERNIA REPAIR Left 10/10/2015   Procedure: LEFT INGUINAL HERNIA REPAIR ;  Surgeon: Donnie Mesa, MD;  Location: [place];  Service: General;  Laterality: Left;  . INSERTION OF MESH Left 10/10/2015   Procedure: INSERTION OF MESH;  Surgeon: Donnie Mesa, MD;  Location: [facility];  Service: General;  Laterality: Left;    Outpatient Encounter Prescriptions as of 09/25/2016  Medication Sig  . acetaminophen (TYLENOL) 500 MG tablet Take 1,000 mg by mouth every 6 (six) hours  as needed for headache.  [name] aspirin EC 325 MG tablet Take 325 mg by mouth daily.  . cetirizine  (ZYRTEC) 10 MG tablet Take 10 mg by mouth at bedtime.   [name] diltiazem (CARDIZEM CD) 120 MG 24 hr capsule Take 1 capsule (120 mg total) by mouth daily.   No facility-administered encounter medications on file as of 09/25/2016.     Allergies: Dust mite extract and Pollen extract  Social History   Social History  . Marital status: Divorced    Spouse name: N/A  . Number of children: N/A  . Years of education: N/A   Occupational History  . Not on file.   Social History Main Topics  . Smoking status: Former Smoker    Packs/day: 1.50    Years: 30.00    Types: Cigarettes    Quit date: 06/03/2008  . Smokeless tobacco: Never Used  . Alcohol use No     Comment: rare  . Drug use: No  . Sexual activity: No   Other Topics Concern  . Not on file   Social History Narrative  . No narrative on file    Family History  Problem Relation Age of Onset  . Hypertension      Review of Systems: A 12-system review of systems was performed and was negative except as noted in the HPI.  --------------------------------------------------------------------------------------------------  Physical Exam: BP 104/76 (BP Location: Left Arm)   Pulse 76   Ht 6' (1.829 m)   Wt 81.4 kg (179 lb 6.4 oz)   SpO2 97%   BMI 24.33 kg/m   General:  Well-developed, well-nourished man, seated comfortably in the exam room. HEENT: No conjunctival pallor or scleral icterus.  Moist mucous membranes.  OP clear. Neck: Supple without lymphadenopathy, thyromegaly, JVD, or HJR.  No carotid bruit. Lungs: Normal work of breathing.  Clear to auscultation bilaterally without wheezes or crackles. Heart: Regular rate and rhythm without murmurs, rubs, or gallops.  Non-displaced PMI. Abd: Bowel sounds present.  Soft, NT/ND without hepatosplenomegaly Ext: No lower extremity edema.  Radial, PT, and DP pulses are 2+ bilaterally Skin: warm and dry without rash Neuro: CNIII-XII intact.  Strength and fine-touch sensation intact in  upper and lower extremities bilaterally. Psych: Normal mood and affect.  EKG:  Atrial fibrillation with ventricular rate of 76 bpm. No other significant abnormalities. No change from prior tracing on 09/01/16 (I have personally reviewed those tracings).  Lab Results  Component Value Date   WBC 8.5 10/03/2015   HGB 15.0 10/03/2015   HCT 45.0 10/03/2015   MCV 91.6 10/03/2015   PLT 174 10/03/2015    Lab Results  Component Value Date   NA 140 10/03/2015   K 4.7 10/03/2015   CL 101 10/03/2015   CO2 26 10/03/2015   BUN 16 10/03/2015   CREATININE 1.09 10/03/2015   GLUCOSE 97 10/03/2015    No results found for: CHOL, HDL, LDLCALC, LDLDIRECT, TRIG, CHOLHDL   --------------------------------------------------------------------------------------------------  ASSESSMENT AND PLAN: Cardiomyopathy Patient appears euvolemic and well compensated on exam today with NYHA class II heart failure symptoms. EF was noted to be moderately reduced on recent echo, though myocardial perfusion stress test is normal LVEF. We have discussed further evaluation and treatment options and have agreed to proceed with left heart catheterization to exclude significant CAD contributing to worsening LVEF, particularly given concern for prior infarct on stress test. I have reviewed the risks, indications, and alternatives to cardiac catheterization, possible angioplasty, and stenting with the patient. Risks include  but are not limited to bleeding, infection, vascular injury, stroke, myocardial infection, arrhythmia, kidney injury, radiation-related injury in the case of prolonged fluoroscopy use, emergency cardiac surgery, and death. The patient understands the risks of serious complication is 1-2 in 4163 with diagnostic cardiac cath and 1-2% or less with angioplasty/stenting. We will obtain routine precath labs today.  We will also discontinue diltiazem and start carvedilol 3.125 mg twice a day for rate control and heart  failure therapy.   Chronic atrial fibrillation Patient is rate controlled. Given his decreased LVEF, his chads score is now 2. We discussed the switching from aspirin to warfarin or a NOAC. Patient wishes to defer this until after the catheterization. As above, we will discontinue diltiazem and start carvedilol 3.125 mg BID. He was instructed to contact us if his resting heart rate is above 100 bpm.  Aortic stenosis and regurgitation Mild aortic stenosis and regurgitation noted on recent echocardiogram. Natural history of aortic stenosis/regurgitation was discussed at length with the patient, including the need for routine clinical and echocardiographic surveillance. No further intervention at this time.  Follow-up: Return to clinic in 6 weeks.  Nelva Bush, MD 09/25/2016 5:02 PM

## 2016-09-25 NOTE — Telephone Encounter (Signed)
Nelva Bush, MD  Katrine Coho, RN        Hyperkalemia noted. Patient not on any medications expected to elevated potassium. EKG today without peaked T waves or other features concerning for significant hyperkalemia. Question if this is due to hemolysis or other lab error. We will will contact patient and repeat basic metabolic panel.

## 2016-09-26 ENCOUNTER — Other Ambulatory Visit (HOSPITAL_COMMUNITY)
Admission: RE | Admit: 2016-09-26 | Discharge: 2016-09-26 | Disposition: A | Discharge status: Home / Self Care | Payer: Medicare Other | Source: Ambulatory Visit | Attending: Internal Medicine | Admitting: Internal Medicine

## 2016-09-26 DIAGNOSIS — E875 Hyperkalemia: Secondary | ICD-10-CM | POA: Diagnosis not present

## 2016-09-26 LAB — BASIC METABOLIC PANEL
Anion gap: 6 (ref 5–15)
BUN: 19 mg/dL (ref 6–20)
CALCIUM: 8.8 mg/dL — AB (ref 8.9–10.3)
CHLORIDE: 102 mmol/L (ref 101–111)
CO2: 28 mmol/L (ref 22–32)
CREATININE: 1.01 mg/dL (ref 0.61–1.24)
Glucose, Bld: 160 mg/dL — ABNORMAL HIGH (ref 65–99)
Potassium: 4.2 mmol/L (ref 3.5–5.1)
SODIUM: 136 mmol/L (ref 135–145)

## 2016-10-01 DIAGNOSIS — B009 Herpesviral infection, unspecified: Secondary | ICD-10-CM | POA: Diagnosis not present

## 2016-10-01 DIAGNOSIS — L923 Foreign body granuloma of the skin and subcutaneous tissue: Secondary | ICD-10-CM | POA: Diagnosis not present

## 2016-10-09 ENCOUNTER — Ambulatory Visit (HOSPITAL_COMMUNITY)
Admission: RE | Admit: 2016-10-09 | Discharge: 2016-10-09 | Disposition: A | Discharge status: Home / Self Care | Payer: Medicare Other | Source: Ambulatory Visit | Attending: Internal Medicine | Admitting: Internal Medicine

## 2016-10-09 ENCOUNTER — Encounter (HOSPITAL_COMMUNITY)
Admission: RE | Disposition: A | Discharge status: Home / Self Care | Payer: Self-pay | Source: Ambulatory Visit | Attending: Internal Medicine

## 2016-10-09 DIAGNOSIS — Z87891 Personal history of nicotine dependence: Secondary | ICD-10-CM | POA: Diagnosis not present

## 2016-10-09 DIAGNOSIS — I429 Cardiomyopathy, unspecified: Secondary | ICD-10-CM | POA: Diagnosis not present

## 2016-10-09 DIAGNOSIS — Z7982 Long term (current) use of aspirin: Secondary | ICD-10-CM | POA: Insufficient documentation

## 2016-10-09 DIAGNOSIS — Z8249 Family history of ischemic heart disease and other diseases of the circulatory system: Secondary | ICD-10-CM | POA: Insufficient documentation

## 2016-10-09 DIAGNOSIS — I428 Other cardiomyopathies: Secondary | ICD-10-CM | POA: Diagnosis not present

## 2016-10-09 DIAGNOSIS — I2510 Atherosclerotic heart disease of native coronary artery without angina pectoris: Secondary | ICD-10-CM | POA: Diagnosis not present

## 2016-10-09 DIAGNOSIS — F419 Anxiety disorder, unspecified: Secondary | ICD-10-CM | POA: Diagnosis not present

## 2016-10-09 DIAGNOSIS — I4819 Other persistent atrial fibrillation: Secondary | ICD-10-CM | POA: Diagnosis present

## 2016-10-09 DIAGNOSIS — I350 Nonrheumatic aortic (valve) stenosis: Secondary | ICD-10-CM | POA: Insufficient documentation

## 2016-10-09 DIAGNOSIS — R9439 Abnormal result of other cardiovascular function study: Secondary | ICD-10-CM | POA: Diagnosis present

## 2016-10-09 DIAGNOSIS — I482 Chronic atrial fibrillation: Secondary | ICD-10-CM | POA: Insufficient documentation

## 2016-10-09 DIAGNOSIS — I2584 Coronary atherosclerosis due to calcified coronary lesion: Secondary | ICD-10-CM | POA: Diagnosis not present

## 2016-10-09 DIAGNOSIS — I4891 Unspecified atrial fibrillation: Secondary | ICD-10-CM | POA: Diagnosis present

## 2016-10-09 HISTORY — PX: RIGHT/LEFT HEART CATH AND CORONARY ANGIOGRAPHY: CATH118266

## 2016-10-09 SURGERY — RIGHT/LEFT HEART CATH AND CORONARY ANGIOGRAPHY
Anesthesia: LOCAL

## 2016-10-09 MED ORDER — HEPARIN (PORCINE) IN NACL 2-0.9 UNIT/ML-% IJ SOLN
INTRAMUSCULAR | Status: DC | PRN
  Administered 2016-10-09: 2000 mL

## 2016-10-09 MED ORDER — FENTANYL CITRATE (PF) 100 MCG/2ML IJ SOLN
INTRAMUSCULAR | Status: AC
  Filled 2016-10-09: qty 2

## 2016-10-09 MED ORDER — HEPARIN SODIUM (PORCINE) 1000 UNIT/ML IJ SOLN
INTRAMUSCULAR | Status: DC | PRN
  Administered 2016-10-09: 4000 [IU] via INTRAVENOUS

## 2016-10-09 MED ORDER — MIDAZOLAM HCL 2 MG/2ML IJ SOLN
INTRAMUSCULAR | Status: DC | PRN
  Administered 2016-10-09 (×2): 1 mg via INTRAVENOUS

## 2016-10-09 MED ORDER — ASPIRIN 81 MG PO CHEW: 81.0000 mg | CHEWABLE_TABLET | ORAL | Status: DC

## 2016-10-09 MED ORDER — LIDOCAINE HCL (PF) 1 % IJ SOLN
INTRAMUSCULAR | Status: DC | PRN
  Administered 2016-10-09 (×2): 2 mL via INTRADERMAL

## 2016-10-09 MED ORDER — SODIUM CHLORIDE 0.9% FLUSH: 3.0000 mL | Freq: Two times a day (BID) | INTRAVENOUS | Status: DC

## 2016-10-09 MED ORDER — VERAPAMIL HCL 2.5 MG/ML IV SOLN
INTRAVENOUS | Status: DC | PRN
  Administered 2016-10-09: 10 mL via INTRA_ARTERIAL

## 2016-10-09 MED ORDER — FENTANYL CITRATE (PF) 100 MCG/2ML IJ SOLN
INTRAMUSCULAR | Status: DC | PRN
  Administered 2016-10-09 (×2): 25 ug via INTRAVENOUS

## 2016-10-09 MED ORDER — SODIUM CHLORIDE 0.9 % IV SOLN: 250.0000 mL | INTRAVENOUS | Status: DC | PRN

## 2016-10-09 MED ORDER — VERAPAMIL HCL 2.5 MG/ML IV SOLN
INTRAVENOUS | Status: AC
  Filled 2016-10-09: qty 2

## 2016-10-09 MED ORDER — MIDAZOLAM HCL 2 MG/2ML IJ SOLN
INTRAMUSCULAR | Status: AC
  Filled 2016-10-09: qty 2

## 2016-10-09 MED ORDER — LIDOCAINE HCL (PF) 1 % IJ SOLN
INTRAMUSCULAR | Status: AC
  Filled 2016-10-09: qty 30

## 2016-10-09 MED ORDER — IOPAMIDOL (ISOVUE-370) INJECTION 76%
INTRAVENOUS | Status: DC | PRN
  Administered 2016-10-09: 90 mL via INTRA_ARTERIAL

## 2016-10-09 MED ORDER — IOPAMIDOL (ISOVUE-370) INJECTION 76%
INTRAVENOUS | Status: AC
  Filled 2016-10-09: qty 100

## 2016-10-09 MED ORDER — SODIUM CHLORIDE 0.9 % WEIGHT BASED INFUSION: 1.0000 mL/kg/h | INTRAVENOUS | Status: DC

## 2016-10-09 MED ORDER — SODIUM CHLORIDE 0.9% FLUSH: 3.0000 mL | INTRAVENOUS | Status: DC | PRN

## 2016-10-09 MED ORDER — LOSARTAN POTASSIUM 25 MG PO TABS: 25.0000 mg | ORAL_TABLET | Freq: Every day | ORAL | 5 refills | Status: DC

## 2016-10-09 MED ORDER — HEPARIN SODIUM (PORCINE) 1000 UNIT/ML IJ SOLN
INTRAMUSCULAR | Status: AC
  Filled 2016-10-09: qty 1

## 2016-10-09 MED ORDER — SODIUM CHLORIDE 0.9 % WEIGHT BASED INFUSION
3.0000 mL/kg/h | INTRAVENOUS | Status: AC
  Administered 2016-10-09: 3 mL/kg/h via INTRAVENOUS

## 2016-10-09 MED ORDER — HEPARIN (PORCINE) IN NACL 2-0.9 UNIT/ML-% IJ SOLN
INTRAMUSCULAR | Status: AC
  Filled 2016-10-09: qty 2000

## 2016-10-09 SURGICAL SUPPLY — 20 items
CATH 5FR JL3.5 JR4 ANG PIG MP (CATHETERS) ×2
CATH BALLN WEDGE 5F 110CM (CATHETERS) ×2
CATH INFINITI 5 FR MPA2 (CATHETERS) ×2
CATH INFINITI 5FR AL1 (CATHETERS) ×2
CATH LANGSTON DUAL LUM PIG 6FR (CATHETERS) ×2
COVER PRB 48X5XTLSCP FOLD TPE (BAG) ×1
COVER PROBE 5X48 (BAG) ×2
DEVICE RAD COMP TR BAND LRG (VASCULAR PRODUCTS) ×2
GLIDESHEATH SLEND SS 6F .021 (SHEATH) ×2
GUIDEWIRE .025 260CM (WIRE) ×2
INQWIRE 1.5J .035X260CM (WIRE) ×2
KIT HEART LEFT (KITS) ×2
PACK CARDIAC CATHETERIZATION (CUSTOM PROCEDURE TRAY) ×2
SHEATH FAST CATH BRACH 5F 5CM (SHEATH) ×2
SYR MEDRAD MARK V 150ML (SYRINGE) ×2
TRANSDUCER W/STOPCOCK (MISCELLANEOUS) ×4
TUBING ART PRESS 72  MALE/FEM (TUBING) ×1
TUBING ART PRESS 72 MALE/FEM (TUBING) ×1
TUBING CIL FLEX 10 FLL-RA (TUBING) ×2
WIRE EMERALD ST .035X260CM (WIRE) ×2

## 2016-10-09 NOTE — Brief Op Note (Signed)
Brief Cardiac Catheterization Note (Full Report to Follow)  Date: 10/09/2016 Time: 10:33 AM  PATIENT:  Nicholas Sutton  71 y.o. male  PRE-OPERATIVE DIAGNOSIS:  Cardiomyopathy and abnormal stress test  POST-OPERATIVE DIAGNOSIS:  Non-obstructive coronary artery disease, non-ischemic cardiomyopathy, moderate aortic stenosis and mild aortic regurgitation  PROCEDURE:  Procedure(s): Right/Left Heart Cath and Coronary Angiography (N/A)  SURGEON:  Surgeon(s) and Role:    * Nelva Bush, MD - Primary  FINDINGS: 1.  Mild non-obstructive coronary artery disease. 2.  Normal left and right heart filling pressures. 3.  Low normal Fick cardiac output/index. 4.  Moderate aortic stenosis. 5.  Mild aortic regurgitation. 6.  Mildly reduced left ventricular contraction (LVEF 45-50%).  RECOMMENDATIONS: 1.  Medical therapy for non-ischemic cardiomyopathy; will continue carvedilol and add losartan. 2.  Outpatient follow-up of atrial fibrillation and aortic valve disease.  Nelva Bush, MD [facility] Pager: [phone]

## 2016-10-09 NOTE — Discharge Instructions (Signed)
Radial Site Care °Refer to this sheet in the next few weeks. These instructions provide you with information about caring for yourself after your procedure. Your health care provider may also give you more specific instructions. Your treatment has been planned according to current medical practices, but problems sometimes occur. Call your health care provider if you have any problems or questions after your procedure. °What can I expect after the procedure? °After your procedure, it is typical to have the following: °· Bruising at the radial site that usually fades within 1-2 weeks. °· Blood collecting in the tissue (hematoma) that may be painful to the touch. It should usually decrease in size and tenderness within 1-2 weeks. °Follow these instructions at home: °· Take medicines only as directed by your health care provider. °· You may shower 24-48 hours after the procedure or as directed by your health care provider. Remove the bandage (dressing) and gently wash the site with plain soap and water. Pat the area dry with a clean towel. Do not rub the site, because this may cause bleeding. °· Do not take baths, swim, or use a hot tub until your health care provider approves. °· Check your insertion site every day for redness, swelling, or drainage. °· Do not apply powder or lotion to the site. °· Do not flex or bend the affected arm for 24 hours or as directed by your health care provider. °· Do not push or pull heavy objects with the affected arm for 24 hours or as directed by your health care provider. °· Do not lift over 10 lb (4.5 kg) for 5 days after your procedure or as directed by your health care provider. °· Ask your health care provider when it is okay to: °¨ Return to work or school. °¨ Resume usual physical activities or sports. °¨ Resume sexual activity. °· Do not drive home if you are discharged the same day as the procedure. Have someone else drive you. °· You may drive 24 hours after the procedure  unless otherwise instructed by your health care provider. °· Do not operate machinery or power tools for 24 hours after the procedure. °· If your procedure was done as an outpatient procedure, which means that you went home the same day as your procedure, a responsible adult should be with you for the first 24 hours after you arrive home. °· Keep all follow-up visits as directed by your health care provider. This is important. °Contact a health care provider if: °· You have a fever. °· You have chills. °· You have increased bleeding from the radial site. Hold pressure on the site and call 911. °Get help right away if: °· You have unusual pain at the radial site. °· You have redness, warmth, or swelling at the radial site. °· You have drainage (other than a small amount of blood on the dressing) from the radial site. °· The radial site is bleeding, hold steady pressure on the site and call 911. °· Your arm or hand becomes pale, cool, tingly, or numb. °This information is not intended to replace advice given to you by your health care provider. Make sure you discuss any questions you have with your health care provider. °Document Released: 08/09/2010 Document Revised: 12/13/2015 Document Reviewed: 01/23/2014 °Elsevier Interactive Patient Education © 2017 [employer] ° °

## 2016-10-09 NOTE — H&P (View-Only) (Signed)
New Outpatient Visit Date: 09/25/2016  Referring Provider: Roderic Palau, NP [facility]  Chief Complaint: Cardiomyopathy and abnormal stress test  HPI:  Nicholas Sutton is a 71 y.o. year-old male with history of chronic atrial fibrillation, who has been referred by Ms. Kayleen Memos for evaluation of cardiomyopathy and abnormal stress test. The patient was last seen in the atrial fibrillation clinic in 08/2016. He had noted some fatigue earlier in the year, prompting echocardiogram. This revealed moderately reduced LVEF. Subsequent myocardial perfusion stress test demonstrated basal and mid inferoseptal infarct and possible peri-infarct ischemia. Patient denies a history of coronary artery disease. He has not had any chest pain, shortness of breath, orthopnea, PND, or lightheadedness. He has rare palpitations when exerting himself but is otherwise unaware of his atrial fibrillation. He also endorses mild fatigue with heavy exertion. He is still able to cut wood with his chain saw and do other strenuous outdoor activities without significant limitation  The patient reports that he diagnosed with atrial fibrillation in 2014 during preoperative evaluation before bowel surgery. He has been maintained on aspirin and diltiazem. He notes being on metoprolol several years ago but reported increasing fatigue when this was increased to 50 mg twice a day.  --------------------------------------------------------------------------------------------------  Cardiovascular History & Procedures: Cardiovascular Problems:  Cardiomyopathy  Chronic atrial fibrillation  Abnormal stress test  Risk Factors:  Male gender, prior tobacco use, and age > 73  Cath/PCI:  None  CV Surgery:  None  EP Procedures and Devices:  None  Non-Invasive Evaluation(s):  Pharmacologic myocardial perfusion stress test (08/27/16): Small in size, moderate in severity, basal and mid inferoseptal defect  consistent with prior MI and peri-infarct ischemia. LVEF 57%.  Transthoracic echocardiogram (08/08/16): Normal LV size and wall thickness. LVEF moderately reduced (EF 35-40%). Normal wall motion. Thickened and calcified aortic valve with mild stenosis and mild regurgitation. Mildly dilated right ventricle with normal contraction.  Recent CV Pertinent Labs: Lab Results  Component Value Date   K 4.7 10/03/2015   BUN 16 10/03/2015   CREATININE 1.09 10/03/2015    --------------------------------------------------------------------------------------------------  Past Medical History:  Diagnosis Date  . Anxiety   . Atrial fibrillation ([place])   . Diarrhea   . Diverticulitis   . 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 Headache(784.0)    x 3 - 4 per year  . Heart murmur    has had no problems     Past Surgical History:  Procedure Laterality Date  . APPENDECTOMY    . COLON RESECTION    . COLOSTOMY REVISION N/A 07/06/2013   Procedure: COLON RESECTION SIGMOID;  Surgeon: Edward Jolly, MD;  Location: [facility];  Service: General;  Laterality: N/A;  . FISTULOTOMY N/A 07/06/2013   Procedure: repair of colovesicle fistula/ FISTULOTOMY;  Surgeon: Edward Jolly, MD;  Location: [facility];  Service: General;  Laterality: N/A;  . GUM SURGERY    . HERNIA REPAIR    . INGUINAL HERNIA REPAIR Left 10/10/2015   Procedure: LEFT INGUINAL HERNIA REPAIR ;  Surgeon: Donnie Mesa, MD;  Location: [facility];  Service: General;  Laterality: Left;  . INSERTION OF MESH Left 10/10/2015   Procedure: INSERTION OF MESH;  Surgeon: Donnie Mesa, MD;  Location: [facility];  Service: General;  Laterality: Left;    Outpatient Encounter Prescriptions as of 09/25/2016  Medication Sig  . acetaminophen (TYLENOL) 500 MG tablet Take 1,000 mg by mouth every 6 (six) hours  as needed for headache.  [name] aspirin EC 325 MG tablet Take 325 mg by mouth daily.  . cetirizine  (ZYRTEC) 10 MG tablet Take 10 mg by mouth at bedtime.   [name] diltiazem (CARDIZEM CD) 120 MG 24 hr capsule Take 1 capsule (120 mg total) by mouth daily.   No facility-administered encounter medications on file as of 09/25/2016.     Allergies: Dust mite extract and Pollen extract  Social History   Social History  . Marital status: Divorced    Spouse name: N/A  . Number of children: N/A  . Years of education: N/A   Occupational History  . Not on file.   Social History Main Topics  . Smoking status: Former Smoker    Packs/day: 1.50    Years: 30.00    Types: Cigarettes    Quit date: 06/03/2008  . Smokeless tobacco: Never Used  . Alcohol use No     Comment: rare  . Drug use: No  . Sexual activity: No   Other Topics Concern  . Not on file   Social History Narrative  . No narrative on file    Family History  Problem Relation Age of Onset  . Hypertension      Review of Systems: A 12-system review of systems was performed and was negative except as noted in the HPI.  --------------------------------------------------------------------------------------------------  Physical Exam: BP 104/76 (BP Location: Left Arm)   Pulse 76   Ht 6' (1.829 m)   Wt 81.4 kg (179 lb 6.4 oz)   SpO2 97%   BMI 24.33 kg/m   General:  Well-developed, well-nourished man, seated comfortably in the exam room. HEENT: No conjunctival pallor or scleral icterus.  Moist mucous membranes.  OP clear. Neck: Supple without lymphadenopathy, thyromegaly, JVD, or HJR.  No carotid bruit. Lungs: Normal work of breathing.  Clear to auscultation bilaterally without wheezes or crackles. Heart: Regular rate and rhythm without murmurs, rubs, or gallops.  Non-displaced PMI. Abd: Bowel sounds present.  Soft, NT/ND without hepatosplenomegaly Ext: No lower extremity edema.  Radial, PT, and DP pulses are 2+ bilaterally Skin: warm and dry without rash Neuro: CNIII-XII intact.  Strength and fine-touch sensation intact in  upper and lower extremities bilaterally. Psych: Normal mood and affect.  EKG:  Atrial fibrillation with ventricular rate of 76 bpm. No other significant abnormalities. No change from prior tracing on 09/01/16 (I have personally reviewed those tracings).  Lab Results  Component Value Date   WBC 8.5 10/03/2015   HGB 15.0 10/03/2015   HCT 45.0 10/03/2015   MCV 91.6 10/03/2015   PLT 174 10/03/2015    Lab Results  Component Value Date   NA 140 10/03/2015   K 4.7 10/03/2015   CL 101 10/03/2015   CO2 26 10/03/2015   BUN 16 10/03/2015   CREATININE 1.09 10/03/2015   GLUCOSE 97 10/03/2015    No results found for: CHOL, HDL, LDLCALC, LDLDIRECT, TRIG, CHOLHDL   --------------------------------------------------------------------------------------------------  ASSESSMENT AND PLAN: Cardiomyopathy Patient appears euvolemic and well compensated on exam today with NYHA class II heart failure symptoms. EF was noted to be moderately reduced on recent echo, though myocardial perfusion stress test is normal LVEF. We have discussed further evaluation and treatment options and have agreed to proceed with left heart catheterization to exclude significant CAD contributing to worsening LVEF, particularly given concern for prior infarct on stress test. I have reviewed the risks, indications, and alternatives to cardiac catheterization, possible angioplasty, and stenting with the patient. Risks include  but are not limited to bleeding, infection, vascular injury, stroke, myocardial infection, arrhythmia, kidney injury, radiation-related injury in the case of prolonged fluoroscopy use, emergency cardiac surgery, and death. The patient understands the risks of serious complication is 1-2 in 8338 with diagnostic cardiac cath and 1-2% or less with angioplasty/stenting. We will obtain routine precath labs today.  We will also discontinue diltiazem and start carvedilol 3.125 mg twice a day for rate control and heart  failure therapy.   Chronic atrial fibrillation Patient is rate controlled. Given his decreased LVEF, his chads score is now 2. We discussed the switching from aspirin to warfarin or a NOAC. Patient wishes to defer this until after the catheterization. As above, we will discontinue diltiazem and start carvedilol 3.125 mg BID. He was instructed to contact us if his resting heart rate is above 100 bpm.  Aortic stenosis and regurgitation Mild aortic stenosis and regurgitation noted on recent echocardiogram. Natural history of aortic stenosis/regurgitation was discussed at length with the patient, including the need for routine clinical and echocardiographic surveillance. No further intervention at this time.  Follow-up: Return to clinic in 6 weeks.  Nelva Bush, MD 09/25/2016 5:02 PM

## 2016-10-09 NOTE — Interval H&P Note (Signed)
History and Physical Interval Note:  10/09/2016 8:58 AM  Nicholas Sutton  has presented today for surgery, with the diagnosis of cardiomyopathy and abnormal stress test.  The various methods of treatment have been discussed with the patient and family. After consideration of risks, benefits and other options for treatment, the patient has consented to  Procedure(s): Left Heart Cath and Coronary Angiography (N/A) and right heart catheterization as a surgical intervention .  The patient's history has been reviewed, patient examined, no change in status, stable for surgery.  I have reviewed the patient's chart and labs.  Questions were answered to the patient's satisfaction.    Cath Lab Visit (complete for each Cath Lab visit)  Clinical Evaluation Leading to the Procedure:   ACS: No.  Non-ACS:    Anginal Classification: CCS II  Anti-ischemic medical therapy: Minimal Therapy (1 class of medications)  Non-Invasive Test Results: Low-risk stress test findings: cardiac mortality <1%/year  Prior CABG: No previous CABG   Nicholas Sutton

## 2016-10-10 ENCOUNTER — Encounter (HOSPITAL_COMMUNITY): Payer: Self-pay | Admitting: Internal Medicine

## 2016-10-10 ENCOUNTER — Telehealth: Payer: Self-pay | Admitting: *Deleted

## 2016-10-10 DIAGNOSIS — I429 Cardiomyopathy, unspecified: Secondary | ICD-10-CM

## 2016-10-10 LAB — POCT I-STAT 3, ART BLOOD GAS (G3+)
Bicarbonate: 25 mmol/L (ref 20.0–28.0)
O2 Saturation: 97 %
TCO2: 26 mmol/L (ref 0–100)
pCO2 arterial: 41.1 mmHg (ref 32.0–48.0)
pH, Arterial: 7.393 (ref 7.350–7.450)
pO2, Arterial: 91 mmHg (ref 83.0–108.0)

## 2016-10-10 LAB — POCT I-STAT 3, VENOUS BLOOD GAS (G3P V)
ACID-BASE DEFICIT: 1 mmol/L (ref 0.0–2.0)
Bicarbonate: 24.7 mmol/L (ref 20.0–28.0)
O2 SAT: 69 %
TCO2: 26 mmol/L (ref 0–100)
pCO2, Ven: 44.7 mmHg (ref 44.0–60.0)
pH, Ven: 7.351 (ref 7.250–7.430)
pO2, Ven: 38 mmHg (ref 32.0–45.0)

## 2016-10-10 NOTE — Telephone Encounter (Signed)
LMTCB for pt 

## 2016-10-10 NOTE — Telephone Encounter (Addendum)
-----   Message from Nelva Bush, MD sent at 10/09/2016 11:11 AM EDT ----- Regarding: Follow-up labs  I started losartan after Mr. Nicholas Sutton's cath today. Can we have him come in for a BMP in about 2 weeks? Thanks. He should keep his office visit with me later next month.  Gerald Stabs

## 2016-10-10 NOTE — Telephone Encounter (Signed)
Spoke with pt, BMET scheduled for 10/24/16.

## 2016-10-23 ENCOUNTER — Encounter: Payer: Self-pay | Admitting: Internal Medicine

## 2016-10-24 ENCOUNTER — Other Ambulatory Visit: Payer: Medicare Other | Admitting: *Deleted

## 2016-10-24 DIAGNOSIS — I429 Cardiomyopathy, unspecified: Secondary | ICD-10-CM

## 2016-10-24 LAB — BASIC METABOLIC PANEL
BUN/Creatinine Ratio: 18 (ref 10–24)
BUN: 18 mg/dL (ref 8–27)
CALCIUM: 8.8 mg/dL (ref 8.6–10.2)
CO2: 24 mmol/L (ref 18–29)
Chloride: 100 mmol/L (ref 96–106)
Creatinine, Ser: 0.98 mg/dL (ref 0.76–1.27)
GFR calc Af Amer: 91 mL/min/{1.73_m2} (ref >59)
GFR, EST NON AFRICAN AMERICAN: 79 mL/min/{1.73_m2} (ref >59)
Glucose: 85 mg/dL (ref 65–99)
Potassium: 4.1 mmol/L (ref 3.5–5.2)
Sodium: 140 mmol/L (ref 134–144)

## 2016-11-03 ENCOUNTER — Encounter: Payer: Self-pay | Admitting: Internal Medicine

## 2016-11-03 ENCOUNTER — Ambulatory Visit (INDEPENDENT_AMBULATORY_CARE_PROVIDER_SITE_OTHER): Payer: Medicare Other | Admitting: Internal Medicine

## 2016-11-03 VITALS — BP 112/76 | HR 83 | Ht 72.0 in | Wt 178.8 lb

## 2016-11-03 DIAGNOSIS — N529 Male erectile dysfunction, unspecified: Secondary | ICD-10-CM

## 2016-11-03 DIAGNOSIS — I350 Nonrheumatic aortic (valve) stenosis: Secondary | ICD-10-CM | POA: Insufficient documentation

## 2016-11-03 DIAGNOSIS — Z6824 Body mass index (BMI) 24.0-24.9, adult: Secondary | ICD-10-CM | POA: Diagnosis not present

## 2016-11-03 DIAGNOSIS — I428 Other cardiomyopathies: Secondary | ICD-10-CM | POA: Diagnosis not present

## 2016-11-03 DIAGNOSIS — I482 Chronic atrial fibrillation: Secondary | ICD-10-CM | POA: Diagnosis not present

## 2016-11-03 DIAGNOSIS — I4821 Permanent atrial fibrillation: Secondary | ICD-10-CM

## 2016-11-03 DIAGNOSIS — I2510 Atherosclerotic heart disease of native coronary artery without angina pectoris: Secondary | ICD-10-CM | POA: Insufficient documentation

## 2016-11-03 MED ORDER — CARVEDILOL 3.125 MG PO TABS: 3.1250 mg | ORAL_TABLET | Freq: Two times a day (BID) | ORAL | 3 refills | Status: DC

## 2016-11-03 MED ORDER — LOSARTAN POTASSIUM 25 MG PO TABS: 25.0000 mg | ORAL_TABLET | Freq: Every day | ORAL | 3 refills | Status: DC

## 2016-11-03 NOTE — Progress Notes (Signed)
Follow-up Outpatient Visit Date: 11/03/2016  Primary Care Provider: Purvis Kilts, [name] [place] [address]  Chief Complaint: Follow-up cardiomyopathy and atrial fibrillation  HPI:  Nicholas Sutton is a 71 y.o. 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 I last saw him on 09/25/16, which time he was feeling relatively well. He had had echocardiogram and subsequent myocardial perfusion stress test performed due to fatigue over the last year. Both studies were abnormal, opting [place] to proceed with left and right heart catheterization. 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  Today, [name] reports feeling well. He has recovered well from last month's cardiac catheterization. He is back to exercising at the gym 3 days a week. He is also performing his usual tasks in the yard, including cutting wood and planting flowers. He denies chest pain, palpitations, lightheadedness, edema, shortness of breath, orthopnea, and PND. He is tolerating carvedilol and losartan well. He inquires today about the safety of using Cialis for erectile dysfunction, given his cardiac problems and medications.[name]  --------------------------------------------------------------------------------------------------  Cardiovascular History & Procedures: Cardiovascular Problems:  Cardiomyopathy  Chronic atrial fibrillation  Abnormal stress test  Risk Factors:  Male gender, prior tobacco use, and age > 11  Cath/PCI:  L/RHC (10/09/16): Mild, nonobstructive CAD, including 20% ostial diagonal and 10% mid/distal LAD disease. No significant stenosis involving dominant LCx or nondominant RCA. RA 5, RV 24/5, PA 24/10 (16), and PCWP 10. AO sat 97%. PA sat 69%. Fick CO/CI 4.6/2.3. LVEF 45-50% with  global hypokinesis. Trivial mitral regurgitation noted. Moderate aortic stenosis (mean gradient 25 mmHg; aortic valve area 1.1 cm).  CV Surgery:  None  EP Procedures and Devices:  None  Non-Invasive Evaluation(s):  Pharmacologic myocardial perfusion stress test (08/27/16): Small in size, moderate in severity, basal and mid inferoseptal defect consistent with prior MI and peri-infarct ischemia. LVEF 57%.  Transthoracic echocardiogram (08/08/16): Normal LV size and wall thickness. LVEF moderately reduced (EF 35-40%). Normal wall motion. Thickened and calcified aortic valve with mild stenosis and mild regurgitation. Mildly dilated right ventricle with normal contraction.  Recent CV Pertinent Labs: Lab Results  Component Value Date   INR 1.1 09/25/2016   K 4.1 10/24/2016   BUN 18 10/24/2016   CREATININE 0.98 10/24/2016    Past medical and surgical history were reviewed and updated in EPIC.  Outpatient Encounter Prescriptions as of 11/03/2016  Medication Sig  . acetaminophen (TYLENOL) 500 MG tablet Take 1,000 mg by mouth every 6 (six) hours as needed for headache.  [name] aspirin EC 325 MG tablet Take 325 mg by mouth daily.  . carvedilol (COREG) 3.125 MG tablet Take 1 tablet (3.125 mg total) by mouth 2 (two) times daily.  . cetirizine (ZYRTEC) 10 MG tablet Take 10 mg by mouth at bedtime.   [name] losartan (COZAAR) 25 MG tablet Take 1 tablet (25 mg total) by mouth daily.   No facility-administered encounter medications on file as of 11/03/2016.     Allergies: Dust mite extract and Pollen extract  Social History   Social History  . Marital status: Divorced    Spouse name: N/A  . Number of children: N/A  . Years of education: N/A   Occupational History  . Not on file.   Social History Main Topics  . Smoking status: Former Smoker    Packs/day: 1.50  Years: 30.00    Types: Cigarettes    Quit date: 06/03/2008  . Smokeless tobacco: Never Used  . Alcohol use No     Comment: rare    . Drug use: No  . Sexual activity: No   Other Topics Concern  . Not on file   Social History Narrative  . No narrative on file    Family History  Problem Relation Age of Onset  . Stroke Mother 56  . Heart attack Father 66  . Hypertension      Review of Systems: A 12-system review of systems was performed and was negative except as noted in the HPI.  --------------------------------------------------------------------------------------------------  Physical Exam: BP 112/76   Pulse 83   Ht 6' (1.829 m)   Wt 178 lb 12.8 oz (81.1 kg)   BMI 24.25 kg/m   General:  Well-developed, well-nourished man, seated comfortably in the exam room. HEENT: No conjunctival pallor or scleral icterus.  Moist mucous membranes.  OP clear. Neck: Supple without lymphadenopathy, thyromegaly, JVD, or HJR. Lungs: Normal work of breathing.  Clear to auscultation bilaterally without wheezes or crackles. Heart: Irregularly irregular with 1/6 systolic murmur loudest at the right upper sternal border.  Non-displaced PMI. Abd: Bowel sounds present.  Soft, NT/ND without hepatosplenomegaly Ext: No lower extremity edema.  Radial, PT, and DP pulses are 2+ bilaterally. Right radial arteriotomy and right antecubital venotomy sites are well-healed. Skin: warm and dry without rash. Left forearm abrasions noted.  EKG:  Atrial fibrillation; ventricular rate 83 bpm.  Lab Results  Component Value Date   WBC 9.1 09/25/2016   HGB 15.0 10/03/2015   HCT 45.5 09/25/2016   MCV 91 09/25/2016   PLT 226 09/25/2016    Lab Results  Component Value Date   NA 140 10/24/2016   K 4.1 10/24/2016   CL 100 10/24/2016   CO2 24 10/24/2016   BUN 18 10/24/2016   CREATININE 0.98 10/24/2016   GLUCOSE 85 10/24/2016    No results found for: CHOL, HDL, LDLCALC, LDLDIRECT, TRIG, CHOLHDL  --------------------------------------------------------------------------------------------------  ASSESSMENT AND PLAN: Nonischemic  cardiomyopathy Patient appears euvolemic and well compensated on exam today. LVEF by LVgram was noted to be mildly reduced at 45-50%. Previous echo showed moderate dysfunction. He is tolerating low-dose carvedilol and losartan well. We discussed escalation of therapy but have agreed to continue the current doses.  Aortic stenosis Mild aortic stenosis noted on echo in 07/2016. More recent catheterization demonstrated transvalvular gradient consistent with moderate AS. Patient does not have any symptoms of flow limiting aortic stenosis. We will continue periodic monitoring and clinical follow-up. Of note, mild aortic regurgitation also noted on echo earlier this year.  Coronary artery disease Very mild CAD was noted on catheterization. Continue risk factor modification. We will defer adding lipid therapy at this time.  Permanent atrial fibrillation Mr. Rettke is adequately rate controlled today. In light of his age, history of hypertension, cardiomyopathy, and mild CAD, his CHADSVASC score is at least 3-4. We have discussed the importance of stroke prevention, including medical therapy with warfarin or NOAC. He is most interested in NOAC and will check with his insurance plan to see which agent is most affordable. I would prefer using either apixaban or rivaroxaban. He will contact us with his preference.  Erectile dysfunction I think it would be reasonable for Mr. Siravo to use Cialis as needed. He is not on a long-acting nitrate nor does he have unstable cardiac disease that would make sexual activity dangerous. I advised  him to speak with his PCP regarding a prescription for Cialis or alternative PDE5 inhibitor.  Follow-up: Return to clinic in 3 months.  More than 25 minutes was spent on this encounter, of which greater than 50% of the time was spent face-to-face with the patient engaging in counseling.  Nelva Bush, MD 11/03/2016 5:25 PM

## 2016-11-03 NOTE — Patient Instructions (Signed)
Medication Instructions:  Your physician recommends that you continue on your current medications as directed. Please refer to the Current Medication list given to you today.   Labwork: None   Testing/Procedures: None   Follow-Up: Your physician recommends that you schedule a follow-up appointment in: 3 months with Dr End.   Any Other Special Instructions Will Be Listed Below (If Applicable).  Let Dr End know what you decide about Xarelto or Eliquis. Webb Silversmith [phone].   If you need a refill on your cardiac medications before your next appointment, please call your pharmacy.

## 2016-11-07 ENCOUNTER — Ambulatory Visit: Payer: Medicare Other | Admitting: Internal Medicine

## 2016-11-10 ENCOUNTER — Telehealth: Payer: Self-pay | Admitting: Internal Medicine

## 2016-11-10 NOTE — Telephone Encounter (Signed)
New Message   Pt calling regarding if he can get cleared to take Vitamin C, Vitamin E & Fish Oil... Requesting call back from nurse

## 2016-11-10 NOTE — Telephone Encounter (Signed)
Pt advised I will forward to Dr End for review. 

## 2016-11-10 NOTE — Telephone Encounter (Signed)
LMTCB

## 2016-11-10 NOTE — Telephone Encounter (Signed)
New Message    Pt is returning your call if you dont reach him he will call back at 4pm

## 2016-11-10 NOTE — Telephone Encounter (Signed)
Vitamin E 200-300 IU daily--pt asking if he can take in the place of aspirin or at least decrease the dose of aspirin from 325 mg daily.  Pt asking if okay to take  for general overall medical benefits: Ester C 500 mg daily Fish Oil --if okay would like a recommendation from Dr End about daily dose.

## 2016-11-11 NOTE — Telephone Encounter (Signed)
There is no cardiac reason for Nicholas Sutton to avoid vitamin E. However, he should discuss this further with his PCP. In regard to aspirin, the answer depends on his decision regarding anticoagulation. At our last visit, we spoke at length about anticoagulation given his a-fib and elevated CHADSVASC. He wanted to think about NOAC therapy and get back to [place] regarding his choice.  I encourage him to choose either Eliquis or Xarelto based on his own research. Once he has started either medication, he can stop taking aspirin. If he declines anticoagulation, it is reasonable to decrease aspirin to 81 mg daily. Thanks.  Nelva Bush, MD [facility] Pager: [phone]

## 2016-11-13 NOTE — Telephone Encounter (Signed)
F/U Call:  Patient calling in regards to getting clearance for vitamin supplements. Thanks.

## 2016-11-13 NOTE — Telephone Encounter (Signed)
Left pt a message to call back. 

## 2016-11-13 NOTE — Telephone Encounter (Signed)
Pt is aware of MD's recommendations. Pt states will let MD know about if he decide to take anticoagulants and stop aspirin or to just decreased the aspirin to 81 mg.

## 2017-02-01 NOTE — Progress Notes (Signed)
Follow-up Outpatient Visit Date: 02/02/2017  Primary Care Provider: Sharilyn Sites, MD [address] [place] [address]  Chief Complaint: Follow-up cardiomyopathy, aortic valve disease, and atrial fibrillation  HPI:  Nicholas Sutton is a 71 y.o. year-old male with history of NICM, mild to moderate aortic stenosis and mild regurgitation, and chronic atrial fibrillation, who presents for follow-up of cardiomyopathy and atrial fibrillation. I last saw him on 11/03/16, at which time he was doing well. We discussed addition of a NOAC, given his elevated CHADSVASC score; he did not fill a prescription due to cost of the medication.  Today, he continues to feel well with the exception of mild fatigue, which has been longstanding. He is going to the gym 5 days per week, walking for 30 minutes on the treadmill and also doing some strength training. He denies chest pain, shortness of breath, palpitations, lightheadedness, orthopnea, PND, and edema. He notes that his resting heart rate is typically 70-90 bpm. With exercise it sometimes goes as high as 160 bpm. He denies bleeding and is currently on low-dose aspirin.  --------------------------------------------------------------------------------------------------  Cardiovascular History & Procedures: Cardiovascular Problems:  Cardiomyopathy  Chronic atrial fibrillation  Mild to moderate aortic stenosis and mild regurgitation  Risk Factors:  Male gender, prior tobacco use, and age > 73  Cath/PCI:  L/RHC (10/09/16): Mild, nonobstructive CAD, including 20% ostial diagonal and 10% mid/distal LAD disease. No significant stenosis involving dominant LCx or nondominant RCA. RA 5, RV 24/5, PA 24/10 (16), and PCWP 10. AO sat 97%. PA sat 69%. Fick CO/CI 4.6/2.3. LVEF 45-50% with global hypokinesis. Trivial mitral regurgitation noted. Moderate aortic stenosis (mean gradient 25 mmHg; aortic valve area 1.1 cm).  CV Surgery:  None  EP Procedures  and Devices:  None  Non-Invasive Evaluation(s):  Pharmacologic myocardial perfusion stress test (08/27/16): Small in size, moderate in severity, basal and mid inferoseptal defect consistent with prior MI and peri-infarct ischemia. LVEF 57%.  Transthoracic echocardiogram (08/08/16): Normal LV size and wall thickness. LVEF moderately reduced (EF 35-40%). Normal wall motion. Thickened and calcified aortic valve with mild stenosis and mild regurgitation. Mildly dilated right ventriclewith normal contraction.  Recent CV Pertinent Labs: Lab Results  Component Value Date   INR 1.1 09/25/2016   K 4.1 10/24/2016   BUN 18 10/24/2016   CREATININE 0.98 10/24/2016    Past medical and surgical history were reviewed and updated in EPIC.  Current Meds  Medication Sig  . acetaminophen (TYLENOL) 500 MG tablet Take 1,000 mg by mouth every 6 (six) hours as needed for headache.  . Ascorbic Acid (VITAMIN C) 1000 MG tablet Take 1,000 mg by mouth daily.  [name] aspirin EC 81 MG tablet Take 81 mg by mouth daily.   . cetirizine (ZYRTEC) 10 MG tablet Take 10 mg by mouth at bedtime.   [name] losartan (COZAAR) 25 MG tablet Take 1 tablet (25 mg total) by mouth daily.  . Omega-3 Fatty Acids (FISH OIL) 1000 MG CPDR Take 1 capsule by mouth daily.  . vitamin E 400 UNIT capsule Take 400 Units by mouth 2 (two) times daily.    Allergies: Dust mite extract and Pollen extract  Social History   Social History  . Marital status: Divorced    Spouse name: N/A  . Number of children: N/A  . Years of education: N/A   Occupational History  . Not on file.   Social History Main Topics  . Smoking status: Former Smoker    Packs/day: 1.50    Years: 30.00  Types: Cigarettes    Quit date: 06/03/2008  . Smokeless tobacco: Never Used  . Alcohol use No     Comment: rare  . Drug use: No  . Sexual activity: No   Other Topics Concern  . Not on file   Social History Narrative  . No narrative on file    Family History    Problem Relation Age of Onset  . Stroke Mother 65  . Heart attack Father 70  . Hypertension Unknown     Review of Systems: A 12-system review of systems was performed and was negative except as noted in the HPI.  --------------------------------------------------------------------------------------------------  Physical Exam: BP 110/90   Pulse 98   Ht 6' (1.829 m)   Wt 179 lb (81.2 kg)   SpO2 98%   BMI 24.28 kg/m  Repeat pulse: 77 bpm  General:  Well-developed, well-nourished man, seated comfortably in the exam room. HEENT: No conjunctival pallor or scleral icterus. Moist mucous membranes.  OP clear. Neck: Supple without lymphadenopathy, thyromegaly, JVD, or HJR. Lungs: Normal work of breathing. Clear to auscultation bilaterally without wheezes or crackles. Heart: Irregularly irregular with 2/6 crescendo-decrescendo systolic murmur across the precordium. Non-displaced PMI. Abd: Bowel sounds present. Soft, NT/ND without hepatosplenomegaly Ext: No lower extremity edema. Radial, PT, and DP pulses are 2+ bilaterally. Skin: Warm and dry without rash.  Lab Results  Component Value Date   WBC 9.1 09/25/2016   HGB 15.3 09/25/2016   HCT 45.5 09/25/2016   MCV 91 09/25/2016   PLT 226 09/25/2016    Lab Results  Component Value Date   NA 140 10/24/2016   K 4.1 10/24/2016   CL 100 10/24/2016   CO2 24 10/24/2016   BUN 18 10/24/2016   CREATININE 0.98 10/24/2016   GLUCOSE 85 10/24/2016    No results found for: CHOL, HDL, LDLCALC, LDLDIRECT, TRIG, CHOLHDL  --------------------------------------------------------------------------------------------------  ASSESSMENT AND PLAN: Non-ischemic cardiomyopathy [name] appears euvolemic with NYHA class I-II symptoms. Given low-normal BP, we will defer escalation of carvedilol and losartan at this time.  Coronary artery disease without angina Mild CAD noted on LHC earlier this year. No new symptoms to suggest worsening coronary  insufficiency. We will continue current medications for primary prevention; I will defer lipid therapy to Dr. Hilma Favors.  Aortic stenosis Mild to moderate AS noted on recent echo and catheterization. No symptoms reported to suggest severe AS. We will plan to repeat an echo in early 2019 to ensure stability.  Chronic atrial fibrillation EKG not done today but exam is consistent with atrial fibrillation with adequate rate control (though pt reports significant HR jumps when exercising). I again recommended anticoagulation given a CHADSVASC score of 4 (age, hypertension, cardiomyopathy, and CAD - mild). Given that NOACs are cost-prohibitive, I have encouraged Mr. Keahey to consider warfarin. He would like to contemplate this and will contact our office in the next 1-2 weeks with his decision. In the meantime, he should continue on low-dose aspirin. We also discussed rate versus rhythm control. Mr. Vi is interested in a trial of cardioversion, as this may help with some of his fatigue. He will need to be therapeutically anticoagulated before we can consider DCCV.  Follow-up: Return to clinic in 3 months.  Nelva Bush, MD 02/02/2017 8:19 AM

## 2017-02-02 ENCOUNTER — Encounter: Payer: Self-pay | Admitting: Internal Medicine

## 2017-02-02 ENCOUNTER — Ambulatory Visit (INDEPENDENT_AMBULATORY_CARE_PROVIDER_SITE_OTHER): Payer: Medicare Other | Admitting: Internal Medicine

## 2017-02-02 VITALS — BP 110/90 | HR 98 | Ht 72.0 in | Wt 179.0 lb

## 2017-02-02 DIAGNOSIS — I2510 Atherosclerotic heart disease of native coronary artery without angina pectoris: Secondary | ICD-10-CM

## 2017-02-02 DIAGNOSIS — I428 Other cardiomyopathies: Secondary | ICD-10-CM | POA: Diagnosis not present

## 2017-02-02 DIAGNOSIS — I350 Nonrheumatic aortic (valve) stenosis: Secondary | ICD-10-CM | POA: Diagnosis not present

## 2017-02-02 DIAGNOSIS — I482 Chronic atrial fibrillation, unspecified: Secondary | ICD-10-CM

## 2017-02-02 NOTE — Patient Instructions (Signed)
Medication Instructions:  Your physician recommends that you continue on your current medications as directed. Please refer to the Current Medication list given to you today.   Labwork: None   Testing/Procedures: None   Follow-Up: Your physician recommends that you schedule a follow-up appointment in: 3 months with Dr End.   Any Other Special Instructions Will Be Listed Below (If Applicable).  Let Dr End know if you decide to start warfarin (coumadin). [phone]   If you need a refill on your cardiac medications before your next appointment, please call your pharmacy.

## 2017-03-17 ENCOUNTER — Telehealth: Payer: Self-pay | Admitting: Internal Medicine

## 2017-03-17 NOTE — Telephone Encounter (Signed)
Voice mail--left message

## 2017-03-17 NOTE — Telephone Encounter (Signed)
LMTCB

## 2017-03-17 NOTE — Telephone Encounter (Signed)
New Message:    Pt wants to know why Dr End prefer Eliquis over Xarelto? He also said he had some more questions about Eliquis.

## 2017-03-19 NOTE — Telephone Encounter (Signed)
Pt states Dr End had given him the name of blood thinners-Eliquis, Xarelto, Pradaxa. Pt is asking if Dr End has a preference among these blood thinners.  Pt advised I will forward to Dr End for review.

## 2017-03-19 NOTE — Telephone Encounter (Signed)
Of the three NOACs listed (Eliquis, Xarelto, and Pradaxa), I most commonly use Eliquis due to its efficacy and relatively few side effects. If Nicholas Sutton feels strongly about taking a once daily medication, Xarelto would be his best bet. Warfarin is the other option and is definitely the least expensive. However, as Nicholas Sutton and I have discussed in the past, warfarin requires regular blood monitoring and consideration of dietary issues and medication interactions.  Nelva Bush, MD [facility] Pager: [phone]

## 2017-03-19 NOTE — Telephone Encounter (Signed)
Follow up ° ° ° ° ° °Returning a call to the nurse °

## 2017-03-19 NOTE — Telephone Encounter (Signed)
Discussed with patient. Patient is going to do some more research then let me know his decision about medication.

## 2017-03-19 NOTE — Telephone Encounter (Signed)
LMTCB for pt 

## 2017-03-19 NOTE — Telephone Encounter (Signed)
LMTCB

## 2017-04-07 ENCOUNTER — Telehealth: Payer: Self-pay | Admitting: Internal Medicine

## 2017-04-07 DIAGNOSIS — I482 Chronic atrial fibrillation, unspecified: Secondary | ICD-10-CM

## 2017-04-07 NOTE — Telephone Encounter (Signed)
New Message   Needs to know price, pharmacy will not tell him until they have a prescription   *STAT* If patient is at the pharmacy, call can be transferred to refill team.   1. Which medications need to be refilled? (please list name of each medication and dose if known)  eliquis   2. Which pharmacy/location (including street and city if local pharmacy) is medication to be sent to? [facility] [address] - [place]   3. Do they need a 30 day or 90 day supply?  [place]

## 2017-04-07 NOTE — Telephone Encounter (Signed)
This is not a refill, this medication has never been prescribed for pt.  Will forward message to Desiree Lucy, RN Dr Darnelle Bos nurse to address.

## 2017-04-08 MED ORDER — APIXABAN 5 MG PO TABS: 5.0000 mg | ORAL_TABLET | Freq: Two times a day (BID) | ORAL | 1 refills | Status: DC

## 2017-04-08 NOTE — Telephone Encounter (Signed)
Pt is requesting a prescription for Eliquis, he is considering taking it but wants to find out the cost of the medication. Pt has called his insurance and has gotten 2 different answers, he feels getting a prescription and having pharmacy fill prescription is the only way to get cost.

## 2017-04-08 NOTE — Telephone Encounter (Signed)
Pt has checked on price of apixaban, cost will be reasonable for him, he plans to start taking apixaban on Friday Spet 21, CMET/CBCd has been scheduled for 04/14/17.  Pt states he is currently taking ASA 81 mg bid--pt advised I will forward to Dr End for recommendation for continuing/discontinuing ASA when he starts apixaban.

## 2017-04-08 NOTE — Addendum Note (Signed)
Addended by: Katrine Coho on: 04/08/2017 04:24 PM   Modules accepted: Orders

## 2017-04-08 NOTE — Telephone Encounter (Signed)
Nicholas Sutton should stop taking aspirin when he begins apixaban. Thanks.  Nelva Bush, MD [facility] Pager: [phone]

## 2017-04-08 NOTE — Telephone Encounter (Signed)
It is fine to prescribe apixaban 5 mg BID. I recommend that we check a CMP and CBC at Nicholas Sutton's earliest convenience if he wishes to start the medication, as it has been several months since his last labs. Thanks.  Nelva Bush, MD [facility] Pager: [phone]

## 2017-04-08 NOTE — Telephone Encounter (Signed)
Pt aware he should stop aspirin when he starts apixaban

## 2017-04-08 NOTE — Telephone Encounter (Signed)
Pt advised I will forward to Dr End for review.

## 2017-04-08 NOTE — Telephone Encounter (Signed)
LMTCB for pt 

## 2017-04-08 NOTE — Addendum Note (Signed)
Addended by: Katrine Coho on: 04/08/2017 05:13 PM   Modules accepted: Orders

## 2017-04-08 NOTE — Telephone Encounter (Signed)
Pt advised I will send in prescription for apixaban 5 mg BID, he knows to let me know if he decides to start taking medication so I can arrange lab appointment for CMET/CBC.

## 2017-04-14 ENCOUNTER — Other Ambulatory Visit: Payer: Medicare Other

## 2017-04-14 DIAGNOSIS — I482 Chronic atrial fibrillation, unspecified: Secondary | ICD-10-CM

## 2017-04-14 LAB — COMPREHENSIVE METABOLIC PANEL
ALBUMIN: 4.4 g/dL (ref 3.6–4.8)
ALK PHOS: 49 IU/L (ref 39–117)
ALT: 16 IU/L (ref 0–44)
AST: 22 IU/L (ref 0–40)
Albumin/Globulin Ratio: 1.5 (ref 1.2–2.2)
BILIRUBIN TOTAL: 0.5 mg/dL (ref 0.0–1.2)
BUN/Creatinine Ratio: 19 (ref 10–24)
BUN: 17 mg/dL (ref 8–27)
CHLORIDE: 101 mmol/L (ref 96–106)
CO2: 21 mmol/L (ref 20–29)
CREATININE: 0.91 mg/dL (ref 0.76–1.27)
Calcium: 9.3 mg/dL (ref 8.6–10.2)
GFR, EST AFRICAN AMERICAN: 100 mL/min/{1.73_m2} (ref >59)
GFR, EST NON AFRICAN AMERICAN: 86 mL/min/{1.73_m2} (ref >59)
GLOBULIN, TOTAL: 2.9 g/dL (ref 1.5–4.5)
Glucose: 92 mg/dL (ref 65–99)
POTASSIUM: 4.3 mmol/L (ref 3.5–5.2)
Sodium: 139 mmol/L (ref 134–144)
Total Protein: 7.3 g/dL (ref 6.0–8.5)

## 2017-04-14 LAB — CBC WITH DIFFERENTIAL/PLATELET
BASOS ABS: 0.1 10*3/uL (ref 0.0–0.2)
Basos: 1 %
EOS (ABSOLUTE): 0.2 10*3/uL (ref 0.0–0.4)
Eos: 2 %
HEMOGLOBIN: 15 g/dL (ref 13.0–17.7)
Hematocrit: 43.2 % (ref 37.5–51.0)
IMMATURE GRANS (ABS): 0 10*3/uL (ref 0.0–0.1)
IMMATURE GRANULOCYTES: 0 %
LYMPHS: 20 %
Lymphocytes Absolute: 1.7 10*3/uL (ref 0.7–3.1)
MCH: 30.5 pg (ref 26.6–33.0)
MCHC: 34.7 g/dL (ref 31.5–35.7)
MCV: 88 fL (ref 79–97)
MONOCYTES: 9 %
Monocytes Absolute: 0.8 10*3/uL (ref 0.1–0.9)
NEUTROS PCT: 68 %
Neutrophils Absolute: 6 10*3/uL (ref 1.4–7.0)
PLATELETS: 184 10*3/uL (ref 150–379)
RBC: 4.92 x10E6/uL (ref 4.14–5.80)
RDW: 14.3 % (ref 12.3–15.4)
WBC: 8.9 10*3/uL (ref 3.4–10.8)

## 2017-05-04 ENCOUNTER — Other Ambulatory Visit: Payer: Self-pay | Admitting: *Deleted

## 2017-05-04 MED ORDER — APIXABAN 5 MG PO TABS: 5.0000 mg | ORAL_TABLET | Freq: Two times a day (BID) | ORAL | 3 refills | Status: DC

## 2017-05-08 ENCOUNTER — Ambulatory Visit (INDEPENDENT_AMBULATORY_CARE_PROVIDER_SITE_OTHER): Payer: Medicare Other | Admitting: Internal Medicine

## 2017-05-08 ENCOUNTER — Encounter: Payer: Self-pay | Admitting: Internal Medicine

## 2017-05-08 VITALS — BP 96/66 | HR 72 | Resp 16 | Ht 72.0 in | Wt 176.6 lb

## 2017-05-08 DIAGNOSIS — I2510 Atherosclerotic heart disease of native coronary artery without angina pectoris: Secondary | ICD-10-CM | POA: Diagnosis not present

## 2017-05-08 DIAGNOSIS — Z23 Encounter for immunization: Secondary | ICD-10-CM

## 2017-05-08 DIAGNOSIS — I428 Other cardiomyopathies: Secondary | ICD-10-CM | POA: Diagnosis not present

## 2017-05-08 DIAGNOSIS — I350 Nonrheumatic aortic (valve) stenosis: Secondary | ICD-10-CM | POA: Diagnosis not present

## 2017-05-08 DIAGNOSIS — I482 Chronic atrial fibrillation: Secondary | ICD-10-CM

## 2017-05-08 DIAGNOSIS — I4821 Permanent atrial fibrillation: Secondary | ICD-10-CM

## 2017-05-08 NOTE — Patient Instructions (Addendum)
Medication Instructions:  Your physician recommends that you continue on your current medications as directed. Please refer to the Current Medication list given to you today.    Labwork: None   Testing/Procedures: Your physician has requested that you have an echocardiogram. Echocardiography is a painless test that uses sound waves to create images of your heart. It provides your doctor with information about the size and shape of your heart and how well your heart's chambers and valves are working. This procedure takes approximately one hour. There are no restrictions for this procedure.  IN 6 MONTHS  Follow-Up: Your physician wants you to follow-up in: 6 months with Dr End a few days after you have the echocardiogram. (April 2019) You will receive a reminder letter in the mail two months in advance. If you don't receive a letter, please call our office to schedule the follow-up appointment.   Any Other Special Instructions Will Be Listed Below (If Applicable).  You had a flu shot today   If you need a refill on your cardiac medications before your next appointment, please call your pharmacy.

## 2017-05-08 NOTE — Progress Notes (Signed)
Follow-up Outpatient Visit Date: 05/08/2017  Primary Care Provider: Sharilyn Sites, MD [address] [place] [address]  Chief Complaint: Follow-up cardiomyopathy, aortic valvular disease, and atrial fibrillation  HPI:  Nicholas Sutton is a 71 y.o. 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  I last saw Mr. Vandruff in in July, at which time he was feeling well except for mild fatigue that has been a chronic problem for him.  I recommended that Mr. Flammia consider anticoagulation, though he was concerned about cost issues.  After doing some research, he decided to begin apixaban last month.  The prescription cost him about $40 a month, which he is able to afford.  Today, Mr. Redmond Pulling reports feeling fairly well.  He notes some fatigue, though this has improved with continued exercise.  He is walking on the treadmill for 45 minutes a day.  He also does some resistance exercises.  He notes that his heart rate occasionally will jump up close to 200 bpm when exercising.  Average heart rate is typically around 140-160 bpm.  He denies chest pain, palpitations, and lightheadedness.  Mild dyspnea on exertion is stable.  Mr. Borquez is tolerating apixaban well without significant bleeding, though he is bruising a bit more easily than in the past.  He no longer uses aspirin.  --------------------------------------------------------------------------------------------------  Cardiovascular History & Procedures: Cardiovascular Problems:  Non-ischemic cardiomyopathy  Chronic atrial fibrillation  Mild to moderate aortic stenosis and mild regurgitation  Risk Factors:  Male gender, prior tobacco use, and age > 55  Cath/PCI:  L/RHC (10/09/16): Mild, nonobstructive CAD, including 20% ostial diagonal and 10% mid/distal LAD  disease. No significant stenosis involving dominant LCx or nondominant RCA. RA 5, RV 24/5, PA 24/10 (16), and PCWP 10. AO sat 97%. PA sat 69%. Fick CO/CI 4.6/2.3. LVEF 45-50% with global hypokinesis. Trivial mitral regurgitation noted. Moderate aortic stenosis (mean gradient 25 mmHg; aortic valve area 1.1 cm).  CV Surgery:  None  EP Procedures and Devices:  None  Non-Invasive Evaluation(s):  Pharmacologic myocardial perfusion stress test (08/27/16): Small in size, moderate in severity, basal and mid inferoseptal defect consistent with prior MI and peri-infarct ischemia. LVEF 57%.  Transthoracic echocardiogram (08/08/16): Normal LV size and wall thickness. LVEF moderately reduced (EF 35-40%). Normal wall motion. Thickened and calcified aortic valve with mild stenosis and mild regurgitation. Mildly dilated right ventriclewith normal contraction.  Recent CV Pertinent Labs: Lab Results  Component Value Date   INR 1.1 09/25/2016   K 4.3 04/14/2017   BUN 17 04/14/2017   CREATININE 0.91 04/14/2017    Past medical and surgical history were reviewed and updated in EPIC.  Current Meds  Medication Sig  . acetaminophen (TYLENOL) 500 MG tablet Take 1,000 mg by mouth every 6 (six) hours as needed for headache.  [name] apixaban (ELIQUIS) 5 MG TABS tablet Take 1 tablet (5 mg total) by mouth 2 (two) times daily.  . Ascorbic Acid (VITAMIN C) 1000 MG tablet Take 1,000 mg by mouth daily.  . carvedilol (COREG) 3.125 MG tablet Take 1 tablet (3.125 mg total) by mouth 2 (two) times daily.  . cetirizine (ZYRTEC) 10 MG tablet Take 10 mg by mouth at bedtime.   [name] losartan (COZAAR) 25 MG tablet Take 1 tablet (25 mg total) by mouth daily.  . Omega-3 Fatty Acids (FISH OIL) 1000 MG CPDR Take 1 capsule by mouth daily.  Allergies: Dust mite extract and Pollen extract  Social History   Social History  . Marital status: Divorced    Spouse name: N/A  . Number of children: N/A  . Years of education: N/A    Occupational History  . Not on file.   Social History Main Topics  . Smoking status: Former Smoker    Packs/day: 1.50    Years: 30.00    Types: Cigarettes    Quit date: 06/03/2008  . Smokeless tobacco: Never Used  . Alcohol use No     Comment: rare  . Drug use: No  . Sexual activity: No   Other Topics Concern  . Not on file   Social History Narrative  . No narrative on file    Family History  Problem Relation Age of Onset  . Stroke Mother 75  . Heart attack Father 26  . Hypertension Unknown     Review of Systems: A 12-system review of systems was performed and was negative except as noted in the HPI.  --------------------------------------------------------------------------------------------------  Physical Exam: BP 96/66   Pulse 72   Resp 16   Ht 6' (1.829 m)   Wt 176 lb 9.6 oz (80.1 kg)   SpO2 96%   BMI 23.95 kg/m   General: Well-developed, well-nourished man, seated comfortably in the exam room. HEENT: No conjunctival pallor or scleral icterus. Moist mucous membranes.  OP clear. Neck: Supple without lymphadenopathy, thyromegaly, JVD, or HJR.  Lungs: Normal work of breathing. Clear to auscultation bilaterally without wheezes or crackles. Heart: Irregularly irregular with 2/6 systolic murmur loudest at the right upper sternal border.  No rubs or gallops. Non-displaced PMI. Abd: Bowel sounds present. Soft, NT/ND without hepatosplenomegaly Ext: No lower extremity edema. Radial, PT, and DP pulses are 2+ bilaterally. Skin: Warm and dry without rash.  EKG: Atrial fibrillation (ventricular rate 82 bpm).  No other significant abnormalities.  Lab Results  Component Value Date   WBC 8.9 04/14/2017   HGB 15.0 04/14/2017   HCT 43.2 04/14/2017   MCV 88 04/14/2017   PLT 184 04/14/2017    Lab Results  Component Value Date   NA 139 04/14/2017   K 4.3 04/14/2017   CL 101 04/14/2017   CO2 21 04/14/2017   BUN 17 04/14/2017   CREATININE 0.91 04/14/2017    GLUCOSE 92 04/14/2017   ALT 16 04/14/2017    No results found for: CHOL, HDL, LDLCALC, LDLDIRECT, TRIG, CHOLHDL  --------------------------------------------------------------------------------------------------  ASSESSMENT AND PLAN: Permanent atrial fibrillation Minimally symptomatic.  Mr. Brandenberger is now on apixaban, which he is tolerating well (CHADSVASC score at least 2).  Resting heart rate seems to be well controlled.  We discussed escalation of carvedilol, though I am hesitant to do this in light of his borderline low blood pressure.  No medication changes at this time.  Nonischemic cardiomyopathy Mr. Ayuso appears euvolemic and well compensated with NYHA class II symptoms.  We will continue his current medications and plan to repeat an echocardiogram shortly before he returns to see me in 6 months.  Mild to moderate aortic stenosis and mild aortic regurgitation No symptoms to suggest worsening valvular problems.  We will repeat an echo in 6 months to reassess the aortic valve.  Coronary artery disease without angina Very mild coronary artery disease noted on catheterization earlier this year.  Continue primary prevention.  I will defer lipid therapy to Dr. Hilma Favors.  Influenza immunization Seasonal flu vaccine administered today at the patient's request.  Follow-up: Return to clinic  in 6 months.  Nelva Bush, MD 05/08/2017 12:02 PM

## 2017-05-26 ENCOUNTER — Other Ambulatory Visit: Payer: Self-pay | Admitting: Dermatology

## 2017-05-26 DIAGNOSIS — L299 Pruritus, unspecified: Secondary | ICD-10-CM | POA: Diagnosis not present

## 2017-05-26 DIAGNOSIS — L986 Other infiltrative disorders of the skin and subcutaneous tissue: Secondary | ICD-10-CM | POA: Diagnosis not present

## 2017-05-26 DIAGNOSIS — D492 Neoplasm of unspecified behavior of bone, soft tissue, and skin: Secondary | ICD-10-CM | POA: Diagnosis not present

## 2017-08-27 ENCOUNTER — Ambulatory Visit (HOSPITAL_COMMUNITY)
Admission: RE | Admit: 2017-08-27 | Discharge: 2017-08-27 | Disposition: A | Discharge status: Home / Self Care | Payer: Medicare Other | Source: Ambulatory Visit | Attending: Internal Medicine | Admitting: Internal Medicine

## 2017-08-27 ENCOUNTER — Other Ambulatory Visit (HOSPITAL_COMMUNITY): Payer: Self-pay | Admitting: Internal Medicine

## 2017-08-27 DIAGNOSIS — S299XXA Unspecified injury of thorax, initial encounter: Secondary | ICD-10-CM

## 2017-08-27 DIAGNOSIS — Z6824 Body mass index (BMI) 24.0-24.9, adult: Secondary | ICD-10-CM | POA: Diagnosis not present

## 2017-08-27 DIAGNOSIS — I428 Other cardiomyopathies: Secondary | ICD-10-CM | POA: Diagnosis not present

## 2017-08-27 DIAGNOSIS — S2231XA Fracture of one rib, right side, initial encounter for closed fracture: Secondary | ICD-10-CM | POA: Insufficient documentation

## 2017-08-27 DIAGNOSIS — R937 Abnormal findings on diagnostic imaging of other parts of musculoskeletal system: Secondary | ICD-10-CM | POA: Diagnosis not present

## 2017-08-27 DIAGNOSIS — X58XXXA Exposure to other specified factors, initial encounter: Secondary | ICD-10-CM | POA: Diagnosis not present

## 2017-08-27 DIAGNOSIS — I4891 Unspecified atrial fibrillation: Secondary | ICD-10-CM | POA: Diagnosis not present

## 2017-09-02 DIAGNOSIS — H25013 Cortical age-related cataract, bilateral: Secondary | ICD-10-CM | POA: Diagnosis not present

## 2017-09-02 DIAGNOSIS — H353131 Nonexudative age-related macular degeneration, bilateral, early dry stage: Secondary | ICD-10-CM | POA: Diagnosis not present

## 2017-09-02 DIAGNOSIS — H2513 Age-related nuclear cataract, bilateral: Secondary | ICD-10-CM | POA: Diagnosis not present

## 2017-09-02 DIAGNOSIS — H43393 Other vitreous opacities, bilateral: Secondary | ICD-10-CM | POA: Diagnosis not present

## 2017-10-21 ENCOUNTER — Ambulatory Visit (HOSPITAL_COMMUNITY): Payer: Medicare Other | Attending: Internal Medicine

## 2017-10-21 ENCOUNTER — Other Ambulatory Visit: Payer: Self-pay

## 2017-10-21 DIAGNOSIS — Z87891 Personal history of nicotine dependence: Secondary | ICD-10-CM | POA: Insufficient documentation

## 2017-10-21 DIAGNOSIS — I428 Other cardiomyopathies: Secondary | ICD-10-CM | POA: Insufficient documentation

## 2017-10-21 DIAGNOSIS — I2510 Atherosclerotic heart disease of native coronary artery without angina pectoris: Secondary | ICD-10-CM | POA: Diagnosis not present

## 2017-10-21 DIAGNOSIS — I083 Combined rheumatic disorders of mitral, aortic and tricuspid valves: Secondary | ICD-10-CM | POA: Diagnosis not present

## 2017-10-21 DIAGNOSIS — I4891 Unspecified atrial fibrillation: Secondary | ICD-10-CM | POA: Diagnosis not present

## 2017-10-21 DIAGNOSIS — I350 Nonrheumatic aortic (valve) stenosis: Secondary | ICD-10-CM | POA: Diagnosis not present

## 2017-11-06 ENCOUNTER — Ambulatory Visit (INDEPENDENT_AMBULATORY_CARE_PROVIDER_SITE_OTHER): Payer: Medicare Other | Admitting: Internal Medicine

## 2017-11-06 ENCOUNTER — Encounter: Payer: Self-pay | Admitting: Internal Medicine

## 2017-11-06 VITALS — BP 104/70 | HR 77 | Ht 72.0 in | Wt 180.0 lb

## 2017-11-06 DIAGNOSIS — I482 Chronic atrial fibrillation, unspecified: Secondary | ICD-10-CM

## 2017-11-06 DIAGNOSIS — I428 Other cardiomyopathies: Secondary | ICD-10-CM | POA: Diagnosis not present

## 2017-11-06 DIAGNOSIS — I350 Nonrheumatic aortic (valve) stenosis: Secondary | ICD-10-CM

## 2017-11-06 NOTE — Progress Notes (Signed)
Follow-up Outpatient Visit Date: 11/06/2017  Primary Care Provider: Sharilyn Sites, MD [address] [place] [address]  Chief Complaint: Follow-up cardiomyopathy and atrial fibrillation  HPI:  Nicholas Sutton is a 72 y.o. 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  I last saw Mr. Wollin in October, at which time he was doing well except for some fatigue.  He was exercising regularly without difficulty, though he did note that his heart rate would increase to 200 bpm at times while exercising.  We did not make any medication changes at that time.  Repeat echo earlier this month showed normal LVEF with moderate aortic stenosis.  Today, Mr. Morocco reports feeling well.  He still has some chronic fatigue that is unchanged.  He denies chest pain, shortness of breath, palpitations, lightheadedness, and edema.  He is tolerating his medications well including anticoagulation with apixaban.  He has not had any significant bleeding.  --------------------------------------------------------------------------------------------------  Cardiovascular History & Procedures: Cardiovascular Problems:  Non-ischemic cardiomyopathy  Chronic atrial fibrillation  Mild to moderate aortic stenosis and mild regurgitation  Risk Factors:  Male gender, prior tobacco use, and age > 68  Cath/PCI:  L/RHC (10/09/16): Mild, nonobstructive CAD, including 20% ostial diagonal and 10% mid/distal LAD disease. No significant stenosis involving dominant LCx or nondominant RCA. RA 5, RV 24/5, PA 24/10 (16), and PCWP 10. AO sat 97%. PA sat 69%. Fick CO/CI 4.6/2.3. LVEF 45-50% with global hypokinesis. Trivial mitral regurgitation noted. Moderate aortic stenosis (mean gradient 25 mmHg; aortic valve area 1.1 cm).  CV  Surgery:  None  EP Procedures and Devices:  None  Non-Invasive Evaluation(s):  Transthoracic echocardiogram (10/21/17): Normal LV size with mild focal basal hypertrophy of the septum.  LVEF 60-65%.  Moderate aortic stenosis and mild aortic regurgitation.  Mitral annular calcification with mild regurgitation.  Mild to moderate biatrial enlargement.  Moderate tricuspid regurgitation.  Mild pulmonary hypertension.  Pharmacologic myocardial perfusion stress test (08/27/16): Small in size, moderate in severity, basal and mid inferoseptal defect consistent with prior MI and peri-infarct ischemia. LVEF 57%.  Transthoracic echocardiogram (08/08/16): Normal LV size and wall thickness. LVEF moderately reduced (EF 35-40%). Normal wall motion. Thickened and calcified aortic valve with mild stenosis and mild regurgitation. Mildly dilated right ventriclewith normal contraction.  Recent CV Pertinent Labs: Lab Results  Component Value Date   INR 1.1 09/25/2016   K 4.3 04/14/2017   BUN 17 04/14/2017   CREATININE 0.91 04/14/2017    Past medical and surgical history were reviewed and updated in EPIC.  Current Meds  Medication Sig  . acetaminophen (TYLENOL) 500 MG tablet Take 1,000 mg by mouth every 6 (six) hours as needed for headache.  [name] apixaban (ELIQUIS) 5 MG TABS tablet Take 1 tablet (5 mg total) by mouth 2 (two) times daily.  . Ascorbic Acid (VITAMIN C) 1000 MG tablet Take 1,000 mg by mouth 2 (two) times daily.   . carvedilol (COREG) 3.125 MG tablet Take 3.125 mg by mouth 2 (two) times daily with a meal.  . cetirizine (ZYRTEC) 10 MG tablet Take 10 mg by mouth at bedtime.   [name] losartan (COZAAR) 25 MG tablet Take 1 tablet (25 mg total) by mouth daily.  . Omega-3 Fatty Acids (FISH OIL) 1000 MG CPDR Take 1 capsule by mouth 2 (two) times daily.     Allergies: Dust mite extract and Pollen extract  Social History   Tobacco Use  . Smoking status: Former Smoker    Packs/day: 1.50    Years: 30.00     Pack years: 45.00    Types: Cigarettes    Last attempt to quit: 06/03/2008    Years since quitting: 9.4  . Smokeless tobacco: Never Used  Substance Use Topics  . Alcohol use: No    Comment: rare  . Drug use: No    Family History  Problem Relation Age of Onset  . Stroke Mother 49  . Heart attack Father 56  . Hypertension Unknown     Review of Systems: A 12-system review of systems was performed and was negative except as noted in the HPI.  --------------------------------------------------------------------------------------------------  Physical Exam: BP 104/70   Pulse 77   Ht 6' (1.829 m)   Wt 180 lb (81.6 kg)   BMI 24.41 kg/m   General:  NAD HEENT: No conjunctival pallor or scleral icterus. Moist mucous membranes.  OP clear. Neck: Supple without lymphadenopathy, thyromegaly, JVD, or HJR. No carotid bruit. Lungs: Normal work of breathing. Clear to auscultation bilaterally without wheezes or crackles. Heart: Irregularly irregular without murmurs, rubs, or gallops. Non-displaced PMI. Abd: Bowel sounds present. Soft, NT/ND without hepatosplenomegaly Ext: No lower extremity edema. Radial, PT, and DP pulses are 2+ bilaterally. Skin: Warm and dry without rash.  EKG: Atrial fibrillation (ventricular rate 77 bpm).  Otherwise, no significant abnormalities.  Lab Results  Component Value Date   WBC 8.9 04/14/2017   HGB 15.0 04/14/2017   HCT 43.2 04/14/2017   MCV 88 04/14/2017   PLT 184 04/14/2017    Lab Results  Component Value Date   NA 139 04/14/2017   K 4.3 04/14/2017   CL 101 04/14/2017   CO2 21 04/14/2017   BUN 17 04/14/2017   CREATININE 0.91 04/14/2017   GLUCOSE 92 04/14/2017   ALT 16 04/14/2017    No results found for: CHOL, HDL, LDLCALC, LDLDIRECT, TRIG, CHOLHDL  --------------------------------------------------------------------------------------------------  ASSESSMENT AND PLAN: Nonischemic cardiomyopathy Mr. Campione appears euvolemic and well  compensated with NYHA class II symptoms.  EF has normalized based on echo earlier this month.  Continue current doses of carvedilol and losartan.  Chronic atrial fibrillation This has been long-standing, likely for decades.  We discussed continued rate control strategy versus rhythm control.  Mr. Obeso has never undergone cardioversion.  I think it would be reasonable to attempt this, though given his long-standing atrial fibrillation, I suspect that it will either be unsuccessful or that he will ultimately revert back to atrial fibrillation.  It may be beneficial for him to meet with electrophysiology to discuss antiarrhythmic therapy and or ablation.  In the meantime, we will continue his current dose of carvedilol as well as indefinite anticoagulation with apixaban given a CHADSVASC score of at least 3-4 (age, cardiomyopathy, hypertension, and mild CAD by cath).  He wishes to do more research on his own regarding rhythm control and will contact us if he wishes to be referred to EP.  Moderate aortic stenosis Aortic stenosis slightly worse by echo but similar to findings on cardiac catheterization last year.  I do not think that Mr. Jorstad's chronic fatigue is related to severe AS.  We will continue clinical follow-up and consider repeating an echo in 1-2 years, sooner if symptoms progress.  Follow-up: Return to clinic in 6 months.  Nelva Bush, MD 11/06/2017 9:30 AM

## 2017-11-06 NOTE — Patient Instructions (Signed)
Medication Instructions:  Your physician recommends that you continue on your current medications as directed. Please refer to the Current Medication list given to you today.  -- If you need a refill on your cardiac medications before your next appointment, please call your pharmacy. --  Labwork: None ordered  Testing/Procedures: None ordered  Follow-Up: Your physician wants you to follow-up in: 6 months with Dr. Saunders Revel.    You will receive a reminder letter in the mail two months in advance. If you don't receive a letter, please call our office to schedule the follow-up appointment.  Thank you for choosing [facility]!!    Any Other Special Instructions Will Be Listed Below (If Applicable).    Cardiac Ablation Cardiac ablation is a procedure to stop some heart tissue from causing problems. The heart has many electrical connections. Sometimes these connections make the heart beat very fast or irregularly. Removing some problem areas can improve the heart rhythm or make it normal. What happens before the procedure?  Follow instructions from your doctor about what you cannot eat or drink.  Ask your doctor about: ? Changing or stopping your normal medicines. This is important if you take diabetes medicines or blood thinners. ? Taking medicines such as aspirin and ibuprofen. These medicines can thin your blood. Do not take these medicines before your procedure if your doctor tells you not to.  Plan to have someone take you home.  If you will be going home right after the procedure, plan to have someone with you for 24 hours. What happens during the procedure?  To lower your risk of infection: ? Your health care team will wash or sanitize their hands. ? Your skin will be washed with soap. ? Hair may be removed from your neck or groin.  An IV tube will be put into one of your veins.  You will be given a medicine to help you relax (sedative).  Skin on your neck or groin will  be numbed.  A cut (incision) will be made in your neck or groin.  A needle will be put through your cut and into a vein in your neck or groin.  A tube (catheter) will be put into the needle. The tube will be moved to your heart. X-rays (fluoroscopy) will be used to help guide the tube.  Small devices (electrodes) on the tip of the tube will send out electrical currents.  Dye may be put through the tube. This helps your surgeon see your heart.  Electrical energy will be used to scar (ablate) some heart tissue. Your surgeon may use: ? Heat (radiofrequency energy). ? Laser energy. ? Extreme cold (cryoablation).  The tube will be taken out.  Pressure will be held on your cut. This helps stop bleeding.  A bandage (dressing) will be put on your cut. The procedure may vary. What happens after the procedure?  You will be monitored until your medicines have worn off.  Your cut will be watched for bleeding. You will need to lie still for a few hours.  Do not drive for 24 hours or as long as your doctor tells you. Summary  Cardiac ablation is a procedure to stop some heart tissue from causing problems.  Electrical energy will be used to scar (ablate) some heart tissue. This information is not intended to replace advice given to you by your health care provider. Make sure you discuss any questions you have with your health care provider. Document Released: 03/09/2013 Document Revised: 05/26/2016  Document Reviewed: 05/26/2016 Elsevier Interactive Patient Education  2017 [employer].  Hospital doctor cardioversion is the delivery of a jolt of electricity to restore a normal rhythm to the heart. A rhythm that is too fast or is not regular keeps the heart from pumping well. In this procedure, sticky patches or metal paddles are placed on the chest to deliver electricity to the heart from a device. This procedure may be done in an emergency if:  There is low or no  blood pressure as a result of the heart rhythm.  Normal rhythm must be restored as fast as possible to protect the brain and heart from further damage.  It may save a life.  This procedure may also be done for irregular or fast heart rhythms that are not immediately life-threatening. Tell a health care provider about:  Any allergies you have.  All medicines you are taking, including vitamins, herbs, eye drops, creams, and over-the-counter medicines.  Any problems you or family members have had with anesthetic medicines.  Any blood disorders you have.  Any surgeries you have had.  Any medical conditions you have.  Whether you are pregnant or may be pregnant. What are the risks? Generally, this is a safe procedure. However, problems may occur, including:  Allergic reactions to medicines.  A blood clot that breaks free and travels to other parts of your body.  The possible return of an abnormal heart rhythm within hours or days after the procedure.  Your heart stopping (cardiac arrest). This is rare.  What happens before the procedure? Medicines  Your health care provider may have you start taking: ? Blood-thinning medicines (anticoagulants) so your blood does not clot as easily. ? Medicines may be given to help stabilize your heart rate and rhythm.  Ask your health care provider about changing or stopping your regular medicines. This is especially important if you are taking diabetes medicines or blood thinners. General instructions  Plan to have someone take you home from the hospital or clinic.  If you will be going home right after the procedure, plan to have someone with you for 24 hours.  Follow instructions from your health care provider about eating or drinking restrictions. What happens during the procedure?  To lower your risk of infection: ? Your health care team will wash or sanitize their hands. ? Your skin will be washed with soap.  An IV tube will be  inserted into one of your veins.  You will be given a medicine to help you relax (sedative).  Sticky patches (electrodes) or metal paddles may be placed on your chest.  An electrical shock will be delivered. The procedure may vary among health care providers and hospitals. What happens after the procedure?  Your blood pressure, heart rate, breathing rate, and blood oxygen level will be monitored until the medicines you were given have worn off.  Do not drive for 24 hours if you were given a sedative.  Your heart rhythm will be watched to make sure it does not change. This information is not intended to replace advice given to you by your health care provider. Make sure you discuss any questions you have with your health care provider. Document Released: 06/27/2002 Document Revised: 03/05/2016 Document Reviewed: 01/11/2016 Elsevier Interactive Patient Education  2017 [employer].  Atrial Fibrillation Atrial fibrillation is a type of heartbeat that is irregular or fast (rapid). If you have this condition, your heart keeps quivering in a weird (chaotic) way. This condition can  make it so your heart cannot pump blood normally. Having this condition gives a person more risk for stroke, heart failure, and other heart problems. There are different types of atrial fibrillation. Talk with your doctor to learn about the type that you have. Follow these instructions at home:  Take over-the-counter and prescription medicines only as told by your doctor.  If your doctor prescribed a blood-thinning medicine, take it exactly as told. Taking too much of it can cause bleeding. If you do not take enough of it, you will not have the protection that you need against stroke and other problems.  Do not use any tobacco products. These include cigarettes, chewing tobacco, and e-cigarettes. If you need help quitting, ask your doctor.  If you have apnea (obstructive sleep apnea), manage it as told by your  doctor.  Do not drink alcohol.  Do not drink beverages that have caffeine. These include coffee, soda, and tea.  Maintain a healthy weight. Do not use diet pills unless your doctor says they are safe for you. Diet pills may make heart problems worse.  Follow diet instructions as told by your doctor.  Exercise regularly as told by your doctor.  Keep all follow-up visits as told by your doctor. This is important. Contact a doctor if:  You notice a change in the speed, rhythm, or strength of your heartbeat.  You are taking a blood-thinning medicine and you notice more bruising.  You get tired more easily when you move or exercise. Get help right away if:  You have pain in your chest or your belly (abdomen).  You have sweating or weakness.  You feel sick to your stomach (nauseous).  You notice blood in your throw up (vomit), poop (stool), or pee (urine).  You are short of breath.  You suddenly have swollen feet and ankles.  You feel dizzy.  Your suddenly get weak or numb in your face, arms, or legs, especially if it happens on one side of your body.  You have trouble talking, trouble understanding, or both.  Your face or your eyelid droops on one side. These symptoms may be an emergency. Do not wait to see if the symptoms will go away. Get medical help right away. Call your local emergency services (911 in the [place] Do not drive yourself to the hospital. This information is not intended to replace advice given to you by your health care provider. Make sure you discuss any questions you have with your health care provider. Document Released: 04/15/2008 Document Revised: 12/13/2015 Document Reviewed: 11/01/2014 Elsevier Interactive Patient Education  [employer].

## 2017-11-07 ENCOUNTER — Encounter: Payer: Self-pay | Admitting: Internal Medicine

## 2017-12-19 ENCOUNTER — Other Ambulatory Visit: Payer: Self-pay | Admitting: Internal Medicine

## 2017-12-21 NOTE — Telephone Encounter (Signed)
Refill Request.  

## 2017-12-23 ENCOUNTER — Telehealth: Payer: Self-pay | Admitting: Internal Medicine

## 2017-12-23 NOTE — Telephone Encounter (Signed)
Patient is concerned about his medications and possible side effects.  He has been taking his cardiac medications since 2018.  Recently he has been feeling tired and fatigued with SOB.    On 5/23, he felt lightheaded and dizzy, went to stand up and passed out.  His friend awoke him, encouraged him to go to the ED, but he refused.  Three hours later, he vomited.  He has been feeling better since then, but occasionally still experiences these symptoms.  Around that 5/23 date, he had developed a blister at his belt line( only area) and it remains there.  I encouraged him to contact his PCP, but he said that the information of side effects that he has been reading pertaining to his cardiac medications, lead him to Dr End.  Please advise, thank you.

## 2017-12-23 NOTE — Telephone Encounter (Signed)
New message      Pt c/o medication issue:  1. Name of Medication: carvedilol 3.125mg , losartan 25mg  and eliquis 5mg  2. How are you currently taking this medication (dosage and times per day)?  As directed on bottle  3. Are you having a reaction (difficulty breathing--STAT)?  Possible side effects from medication 4. What is your medication issue?  Pt states that he believes that he is having side effects from the medication.  On may 23rd, he stood up and passed completely out, 3 hrs later he threw up.  He also has had a blister that has not healed.  Other symptoms are sob on exertion, dizziness and tired/weakness.  Pt request to talk to a nurse to see if his medications need to be adjusted.  Please advise

## 2017-12-24 NOTE — Telephone Encounter (Signed)
Spoke with the patient and gave him Dr Darnelle Bos recommendations.  He verbalized understanding and will keep [place] informed of his symptoms and Bps over the next few weeks.

## 2017-12-24 NOTE — Telephone Encounter (Signed)
It is difficult to know if medications may have contributed to some of his symptoms Nicholas Sutton describes.  However, given that he has been on this regimen for some time, it seems unlikely.  I wonder if he was developing a GI illness around the time that he first became dizzy, given subsequent emesis.  He should continue to monitor his blood pressure at home.  If it remains low (less than 100/60), or he continues to feel frequent lightheadedness, he could try cutting losartan down to 12.5 mg daily.  I agree with recommendation for Nicholas Sutton to see his PCP for evaluation of the blister along his beltline.  Nelva Bush, MD [facility] Pager: [phone]

## 2017-12-31 ENCOUNTER — Other Ambulatory Visit: Payer: Self-pay | Admitting: Internal Medicine

## 2017-12-31 DIAGNOSIS — I428 Other cardiomyopathies: Secondary | ICD-10-CM

## 2017-12-31 NOTE — Telephone Encounter (Signed)
Please review for refill. Thanks!  

## 2018-02-04 ENCOUNTER — Telehealth: Payer: Self-pay

## 2018-02-04 NOTE — Telephone Encounter (Signed)
   [facility] Pre-operative Risk Assessment    Request for surgical clearance:  1. What type of surgery is being performed? Isolated periodontal surgery around upper right molars   2. When is this surgery scheduled? 02/10/2018    3. What type of clearance is required (medical clearance vs. Pharmacy clearance to hold med vs. Both)? Pharmacy  4. Are there any medications that need to be held prior to surgery and how long? Dentist is asking if Pt should hold his Eliquis   5. Practice name and name of physician performing surgery? Dr. Mikey Bussing at [facility]     6. What is your office phone number  [phone]    2.   What is your office fax number  [phone]  8.   Anesthesia type (None, local, MAC, general) ? None noted in request   Nicholas Sutton 02/04/2018, 9:08 AM  _________________________________________________________________   (provider comments below)

## 2018-02-04 NOTE — Telephone Encounter (Signed)
pharm please address eliquis thank you.

## 2018-02-04 NOTE — Telephone Encounter (Signed)
   Primary Cardiologist:Christopher End, MD  Chart reviewed as part of pre-operative protocol coverage. Because of Nicholas Sutton's past medical history and time since last visit, he/she will require a follow-up visit in order to better assess preoperative cardiovascular risk.  Pt  needs appt due to recent syncope.  Pt feels his BP is running low.  Concern with surgery BP may drop and he may have problems.    Pre-op covering staff: - Please schedule appointment and call patient to inform them. - Please contact requesting surgeon's office via preferred method (i.e, phone, fax) to inform them of need for appointment prior to surgery.  Cecilie Kicks, NP  02/04/2018, 4:11 PM

## 2018-02-04 NOTE — Telephone Encounter (Signed)
New Message     Renee calling from Dr. Geralynn Ochs office waiting for a fax for medication clearance, she needs this before 5 today.

## 2018-02-04 NOTE — Telephone Encounter (Signed)
PT HAS BEEN SCHEDULED WITH DR END 02-08-18 9:40 AM

## 2018-02-04 NOTE — Telephone Encounter (Signed)
Would recommend against holding Eliquis for dental procedure.

## 2018-02-07 NOTE — Progress Notes (Addendum)
Follow-up Outpatient Visit Date: 02/08/2018  Primary Care Provider: Patient, No Pcp Per No address on file  Chief Complaint: Follow-up nonischemic cardiomyopathy and atrial fibrillation  HPI:  Nicholas Sutton is a 72 y.o. 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  I last saw Nicholas Sutton in April, at which time he was doing well other than some chronic fatigue.  We did not make any medication changes at the time but discussed the utility of rhythm control given his chronic fatigue.  He did not wish to proceed with cardioversion or EP consultation at that time.  Today, Nicholas Sutton reports feeling well.  He had an episode of syncope that occurred on 12/10/2017.  He had been working out on his tractor for about 6 hours and then spent 2 hours under his tractor making repairs.  When he stood up, he felt very lightheaded and dizzy.  It persisted for several hours.  He was sitting in a chair drinking water when he suddenly fell out of the chair and lost consciousness for about a minute.  Family member checked his blood pressure and noted a systolic pressure of 97.  Nicholas Sutton subsequently noticed nausea and generalized weakness that lasted for several hours.  Shortly thereafter, he noticed a bandlike rash along the left flank that was initially red and blistery and subsequently peeled.  He still has some discoloration there.  The rash was initially painful with a burning sensation, though that has now resolved.  Nicholas Sutton is in need of dental procedure in the next few weeks and wishes to know if any medication changes from a heart standpoint need to be made.  --------------------------------------------------------------------------------------------------  Cardiovascular  History & Procedures: Cardiovascular Problems:  Non-ischemic cardiomyopathy  Chronic atrial fibrillation  Mild to moderate aortic stenosis and mild regurgitation  Risk Factors:  Male gender, prior tobacco use, and age > 71  Cath/PCI:  L/RHC (10/09/16): Mild, nonobstructive CAD, including 20% ostial diagonal and 10% mid/distal LAD disease. No significant stenosis involving dominant LCx or nondominant RCA. RA 5, RV 24/5, PA 24/10 (16), and PCWP 10. AO sat 97%. PA sat 69%. Fick CO/CI 4.6/2.3. LVEF 45-50% with global hypokinesis. Trivial mitral regurgitation noted. Moderate aortic stenosis (mean gradient 25 mmHg; aortic valve area 1.1 cm).  CV Surgery:  None  EP Procedures and Devices:  None  Non-Invasive Evaluation(s):  Transthoracic echocardiogram (10/21/17): Normal LV size with mild focal basal hypertrophy of the septum.  LVEF 60-65%.  Moderate aortic stenosis and mild aortic regurgitation.  Mitral annular calcification with mild regurgitation.  Mild to moderate biatrial enlargement.  Moderate tricuspid regurgitation.  Mild pulmonary hypertension.  Pharmacologic myocardial perfusion stress test (08/27/16): Small in size, moderate in severity, basal and mid inferoseptal defect consistent with prior MI and peri-infarct ischemia. LVEF 57%.  Transthoracic echocardiogram (08/08/16): Normal LV size and wall thickness. LVEF moderately reduced (EF 35-40%). Normal wall motion. Thickened and calcified aortic valve with mild stenosis and mild regurgitation. Mildly dilated right ventriclewith normal contraction.  Recent CV Pertinent Labs: Lab Results  Component Value Date   INR 1.1 09/25/2016   K 4.3 04/14/2017   BUN 17 04/14/2017   CREATININE 0.91 04/14/2017    Past medical and surgical history were reviewed and updated in EPIC.  Current Meds  Medication Sig  . acetaminophen (TYLENOL) 500 MG tablet Take  1,000 mg by mouth every 6 (six) hours as needed for headache.  [name] apixaban  (ELIQUIS) 5 MG TABS tablet Take 1 tablet (5 mg total) by mouth 2 (two) times daily.  . Ascorbic Acid (VITAMIN C) 1000 MG tablet Take 1,000 mg by mouth 2 (two) times daily.   . carvedilol (COREG) 3.125 MG tablet TAKE 1 TABLET BY MOUTH TWICE DAILY  . cetirizine (ZYRTEC) 10 MG tablet Take 10 mg by mouth at bedtime.   [name] losartan (COZAAR) 25 MG tablet TAKE 1 TABLET BY MOUTH ONCE DAILY  . Omega-3 Fatty Acids (FISH OIL) 1000 MG CPDR Take 1 capsule by mouth 2 (two) times daily.     Allergies: Dust mite extract and Pollen extract  Social History   Tobacco Use  . Smoking status: Former Smoker    Packs/day: 1.50    Years: 30.00    Pack years: 45.00    Types: Cigarettes    Last attempt to quit: 06/03/2008    Years since quitting: 9.6  . Smokeless tobacco: Never Used  Substance Use Topics  . Alcohol use: No    Comment: rare  . Drug use: No    Family History  Problem Relation Age of Onset  . Stroke Mother 27  . Heart attack Father 79  . Hypertension Unknown     Review of Systems: A 12-system review of systems was performed and was negative except as noted in the HPI.  --------------------------------------------------------------------------------------------------  Physical Exam: BP 114/90   Pulse 94   Ht 6' (1.829 m)   Wt 181 lb 6.4 oz (82.3 kg)   SpO2 99%   BMI 24.60 kg/m  Repeat BP 118/80  General: NAD. HEENT: No conjunctival pallor or scleral icterus.  Moist mucous membranes.  OP clear. Neck: Supple without lymphadenopathy, thyromegaly, JVD, or HJR.  No carotid bruit. Lungs: Normal work of breathing.  Clear to auscultation bilaterally without wheezes or crackles. Heart: Irregularly irregular with 1/6 systolic murmur loudest at the right upper sternal border.  No rubs or gallops..  Non-displaced PMI. Abd: Bowel sounds present.  Soft, NT/ND without hepatosplenomegaly Ext: No lower extremity edema.  Radial, PT, and DP pulses are 2+ bilaterally. Skin: Warm and dry.   Bandlike area of hyperpigmentation just above the waist on the left flank.  EKG: Atrial fibrillation with right axis deviation and nonspecific ST changes.  I suspect axis shift is due to limb lead placement.  Otherwise, no significant change since 11/06/2017  Lab Results  Component Value Date   WBC 8.9 04/14/2017   HGB 15.0 04/14/2017   HCT 43.2 04/14/2017   MCV 88 04/14/2017   PLT 184 04/14/2017    Lab Results  Component Value Date   NA 139 04/14/2017   K 4.3 04/14/2017   CL 101 04/14/2017   CO2 21 04/14/2017   BUN 17 04/14/2017   CREATININE 0.91 04/14/2017   GLUCOSE 92 04/14/2017   ALT 16 04/14/2017    No results found for: CHOL, HDL, LDLCALC, LDLDIRECT, TRIG, CHOLHDL  --------------------------------------------------------------------------------------------------  ASSESSMENT AND PLAN: Chronic atrial fibrillation Still adequately rate controlled.  No symptoms reported by Nicholas Sutton other than his chronic fatigue and one syncopal episode.  I suspect the syncope was due to dehydration in the setting of having worked outside for several hours.  We again discussed electrophysiology evaluation to see if restoration of sinus rhythm might improve his fatigue.  He would like to think about this more before committing.  We will continue indefinite anticoagulation as  well as rate control with carvedilol 3.125 mg twice daily.  Nonischemic cardiomyopathy Nicholas Sutton appears euvolemic with NYHA class II symptoms.  We will continue current doses of carvedilol and losartan, given normal LVEF by most recent echo in April.  Moderate aortic stenosis Noted by echo in April.  Other than one syncopal episode that was likely due to dehydration, Nicholas Sutton does not have any new symptoms to suggest worsening aortic valvular disease.  We will continue clinical follow-up with plans for repeat echo in about a year.  Rash This in the setting of Nicholas Sutton's syncopal episode in May.  I wonder if he may  have fallen or laid on this area.  Given its somewhat dermatomal distribution and burning character, shingles is a consideration.  I advised him to see his PCP immediately if he has recurrent symptoms.  Preoperative evaluation I think it is reasonable for Nicholas Sutton to undergo his planned dental procedure.  This is a low risk procedure.  If necessary, apixaban can be held for 2 days prior to the procedure and restarted when it is felt safe to do so by his oral surgeon.  Follow-up: Return to clinic in 3 months.  Nelva Bush, MD 02/08/2018 10:19 AM

## 2018-02-08 ENCOUNTER — Ambulatory Visit (INDEPENDENT_AMBULATORY_CARE_PROVIDER_SITE_OTHER): Payer: Medicare Other | Admitting: Internal Medicine

## 2018-02-08 ENCOUNTER — Encounter: Payer: Self-pay | Admitting: Internal Medicine

## 2018-02-08 VITALS — BP 114/90 | HR 94 | Ht 72.0 in | Wt 181.4 lb

## 2018-02-08 DIAGNOSIS — I481 Persistent atrial fibrillation: Secondary | ICD-10-CM

## 2018-02-08 DIAGNOSIS — Z01810 Encounter for preprocedural cardiovascular examination: Secondary | ICD-10-CM | POA: Diagnosis not present

## 2018-02-08 DIAGNOSIS — I4819 Other persistent atrial fibrillation: Secondary | ICD-10-CM

## 2018-02-08 DIAGNOSIS — I350 Nonrheumatic aortic (valve) stenosis: Secondary | ICD-10-CM

## 2018-02-08 DIAGNOSIS — I428 Other cardiomyopathies: Secondary | ICD-10-CM | POA: Diagnosis not present

## 2018-02-08 NOTE — Patient Instructions (Addendum)
Medication Instructions:   Your physician recommends that you continue on your current medications as directed. Please refer to the Current Medication list given to you today.   If you need a refill on your cardiac medications before your next appointment, please call your pharmacy.  Labwork: NONE ORDERED  TODAY    Testing/Procedures: NONE ORDERED  TODAY    Follow-Up: WITH DR END IN THREE MONTHS    Any Other Special Instructions Will Be Listed Below (If Applicable).   Electrical Cardioversion Electrical cardioversion is the delivery of a jolt of electricity to restore a normal rhythm to the heart. A rhythm that is too fast or is not regular keeps the heart from pumping well. In this procedure, sticky patches or metal paddles are placed on the chest to deliver electricity to the heart from a device. This procedure may be done in an emergency if:  There is low or no blood pressure as a result of the heart rhythm.  Normal rhythm must be restored as fast as possible to protect the brain and heart from further damage.  It may save a life.  This procedure may also be done for irregular or fast heart rhythms that are not immediately life-threatening. Tell a health care provider about:  Any allergies you have.  All medicines you are taking, including vitamins, herbs, eye drops, creams, and over-the-counter medicines.  Any problems you or family members have had with anesthetic medicines.  Any blood disorders you have.  Any surgeries you have had.  Any medical conditions you have.  Whether you are pregnant or may be pregnant. What are the risks? Generally, this is a safe procedure. However, problems may occur, including:  Allergic reactions to medicines.  A blood clot that breaks free and travels to other parts of your body.  The possible return of an abnormal heart rhythm within hours or days after the procedure.  Your heart stopping (cardiac arrest). This is  rare.  What happens before the procedure? Medicines  Your health care provider may have you start taking: ? Blood-thinning medicines (anticoagulants) so your blood does not clot as easily. ? Medicines may be given to help stabilize your heart rate and rhythm.  Ask your health care provider about changing or stopping your regular medicines. This is especially important if you are taking diabetes medicines or blood thinners. General instructions  Plan to have someone take you home from the hospital or clinic.  If you will be going home right after the procedure, plan to have someone with you for 24 hours.  Follow instructions from your health care provider about eating or drinking restrictions. What happens during the procedure?  To lower your risk of infection: ? Your health care team will wash or sanitize their hands. ? Your skin will be washed with soap.  An IV tube will be inserted into one of your veins.  You will be given a medicine to help you relax (sedative).  Sticky patches (electrodes) or metal paddles may be placed on your chest.  An electrical shock will be delivered. The procedure may vary among health care providers and hospitals. What happens after the procedure?  Your blood pressure, heart rate, breathing rate, and blood oxygen level will be monitored until the medicines you were given have worn off.  Do not drive for 24 hours if you were given a sedative.  Your heart rhythm will be watched to make sure it does not change. This information is not intended to replace  advice given to you by your health care provider. Make sure you discuss any questions you have with your health care provider. Document Released: 06/27/2002 Document Revised: 03/05/2016 Document Reviewed: 01/11/2016 Elsevier Interactive Patient Education  2017 [employer].                                                                                                                                                   Cardiac Ablation Cardiac ablation is a procedure to stop some heart tissue from causing problems. The heart has many electrical connections. Sometimes these connections make the heart beat very fast or irregularly. Removing some problem areas can improve the heart rhythm or make it normal. What happens before the procedure?  Follow instructions from your doctor about what you cannot eat or drink.  Ask your doctor about: ? Changing or stopping your normal medicines. This is important if you take diabetes medicines or blood thinners. ? Taking medicines such as aspirin and ibuprofen. These medicines can thin your blood. Do not take these medicines before your procedure if your doctor tells you not to.  Plan to have someone take you home.  If you will be going home right after the procedure, plan to have someone with you for 24 hours. What happens during the procedure?  To lower your risk of infection: ? Your health care team will wash or sanitize their hands. ? Your skin will be washed with soap. ? Hair may be removed from your neck or groin.  An IV tube will be put into one of your veins.  You will be given a medicine to help you relax (sedative).  Skin on your neck or groin will be numbed.  A cut (incision) will be made in your neck or groin.  A needle will be put through your cut and into a vein in your neck or groin.  A tube (catheter) will be put into the needle. The tube will be moved to your heart. X-rays (fluoroscopy) will be used to help guide the tube.  Small devices (electrodes) on the tip of the tube will send out electrical currents.  Dye may be put through the tube. This helps your surgeon see your heart.  Electrical energy will be used to scar (ablate) some heart tissue. Your surgeon may use: ? Heat (radiofrequency energy). ? Laser energy. ? Extreme cold (cryoablation).  The tube will be taken out.  Pressure will be held on your cut.  This helps stop bleeding.  A bandage (dressing) will be put on your cut. The procedure may vary. What happens after the procedure?  You will be monitored until your medicines have worn off.  Your cut will be watched for bleeding. You will need to lie still for a few hours.  Do not drive for 24 hours or as long as your doctor tells  you. Summary  Cardiac ablation is a procedure to stop some heart tissue from causing problems.  Electrical energy will be used to scar (ablate) some heart tissue. This information is not intended to replace advice given to you by your health care provider. Make sure you discuss any questions you have with your health care provider. Document Released: 03/09/2013 Document Revised: 05/26/2016 Document Reviewed: 05/26/2016 Elsevier Interactive Patient Education  2017 [place].     Atrial Fibrillation Atrial fibrillation is a type of heartbeat that is irregular or fast (rapid). If you have this condition, your heart keeps quivering in a weird (chaotic) way. This condition can make it so your heart cannot pump blood normally. Having this condition gives a person more risk for stroke, heart failure, and other heart problems. There are different types of atrial fibrillation. Talk with your doctor to learn about the type that you have. Follow these instructions at home:  Take over-the-counter and prescription medicines only as told by your doctor.  If your doctor prescribed a blood-thinning medicine, take it exactly as told. Taking too much of it can cause bleeding. If you do not take enough of it, you will not have the protection that you need against stroke and other problems.  Do not use any tobacco products. These include cigarettes, chewing tobacco, and e-cigarettes. If you need help quitting, ask your doctor.  If you have apnea (obstructive sleep apnea), manage it as told by your doctor.  Do not drink alcohol.  Do not drink beverages that have  caffeine. These include coffee, soda, and tea.  Maintain a healthy weight. Do not use diet pills unless your doctor says they are safe for you. Diet pills may make heart problems worse.  Follow diet instructions as told by your doctor.  Exercise regularly as told by your doctor.  Keep all follow-up visits as told by your doctor. This is important. Contact a doctor if:  You notice a change in the speed, rhythm, or strength of your heartbeat.  You are taking a blood-thinning medicine and you notice more bruising.  You get tired more easily when you move or exercise. Get help right away if:  You have pain in your chest or your belly (abdomen).  You have sweating or weakness.  You feel sick to your stomach (nauseous).  You notice blood in your throw up (vomit), poop (stool), or pee (urine).  You are short of breath.  You suddenly have swollen feet and ankles.  You feel dizzy.  Your suddenly get weak or numb in your face, arms, or legs, especially if it happens on one side of your body.  You have trouble talking, trouble understanding, or both.  Your face or your eyelid droops on one side. These symptoms may be an emergency. Do not wait to see if the symptoms will go away. Get medical help right away. Call your local emergency services (911 in the [place] Do not drive yourself to the hospital. This information is not intended to replace advice given to you by your health care provider. Make sure you discuss any questions you have with your health care provider. Document Released: 04/15/2008 Document Revised: 12/13/2015 Document Reviewed: 11/01/2014 Elsevier Interactive Patient Education  [employer].

## 2018-02-18 ENCOUNTER — Other Ambulatory Visit: Payer: Self-pay

## 2018-03-19 ENCOUNTER — Encounter: Payer: Self-pay | Admitting: Physician Assistant

## 2018-03-23 ENCOUNTER — Encounter: Payer: Self-pay | Admitting: Physician Assistant

## 2018-03-23 ENCOUNTER — Ambulatory Visit (INDEPENDENT_AMBULATORY_CARE_PROVIDER_SITE_OTHER): Payer: Medicare Other | Admitting: Physician Assistant

## 2018-03-23 VITALS — BP 108/66 | HR 79 | Ht 72.0 in | Wt 184.0 lb

## 2018-03-23 DIAGNOSIS — I350 Nonrheumatic aortic (valve) stenosis: Secondary | ICD-10-CM

## 2018-03-23 DIAGNOSIS — I428 Other cardiomyopathies: Secondary | ICD-10-CM

## 2018-03-23 DIAGNOSIS — I481 Persistent atrial fibrillation: Secondary | ICD-10-CM | POA: Diagnosis not present

## 2018-03-23 DIAGNOSIS — I2510 Atherosclerotic heart disease of native coronary artery without angina pectoris: Secondary | ICD-10-CM

## 2018-03-23 DIAGNOSIS — I4819 Other persistent atrial fibrillation: Secondary | ICD-10-CM

## 2018-03-23 LAB — BASIC METABOLIC PANEL
BUN / CREAT RATIO: 20 (ref 10–24)
BUN: 20 mg/dL (ref 8–27)
CO2: 22 mmol/L (ref 20–29)
CREATININE: 0.99 mg/dL (ref 0.76–1.27)
Calcium: 9.7 mg/dL (ref 8.6–10.2)
Chloride: 103 mmol/L (ref 96–106)
GFR calc non Af Amer: 77 mL/min/{1.73_m2} (ref >59)
GFR, EST AFRICAN AMERICAN: 89 mL/min/{1.73_m2} (ref >59)
Glucose: 89 mg/dL (ref 65–99)
Potassium: 4.6 mmol/L (ref 3.5–5.2)
SODIUM: 137 mmol/L (ref 134–144)

## 2018-03-23 LAB — CBC
HEMATOCRIT: 41.9 % (ref 37.5–51.0)
Hemoglobin: 14.3 g/dL (ref 13.0–17.7)
MCH: 30.4 pg (ref 26.6–33.0)
MCHC: 34.1 g/dL (ref 31.5–35.7)
MCV: 89 fL (ref 79–97)
Platelets: 167 10*3/uL (ref 150–450)
RBC: 4.7 x10E6/uL (ref 4.14–5.80)
RDW: 14.1 % (ref 12.3–15.4)
WBC: 6.9 10*3/uL (ref 3.4–10.8)

## 2018-03-23 NOTE — Patient Instructions (Signed)
Medication Instructions:  1. Your physician recommends that you continue on your current medications as directed. Please refer to the Current Medication list given to you today.   Labwork: TODAY BMET, CBC  Testing/Procedures: NONE ORDERED TODAY  Follow-Up: [facility] 6 MONTHS  Any Other Special Instructions Will Be Listed Below (If Applicable).     If you need a refill on your cardiac medications before your next appointment, please call your pharmacy.

## 2018-03-23 NOTE — Progress Notes (Signed)
Cardiology Office Note:    Date:  03/23/2018   ID:  Nicholas Sutton, DOB 19-Jul-1946, MRN [id number]  PCP:  Patient, No Pcp Per  Cardiologist:  Nelva Bush, MD   Referring MD: No ref. provider found   Chief Complaint  Patient presents with  . Follow-up    AFib, Cardiomyopathy     History of Present Illness:    Nicholas Sutton is a 72 y.o. 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  EF was previously 35-40% in January 2018.  Cardiac catheterization in March 2018 demonstrated mild nonobstructive coronary disease.  Ejection fraction has improved to normal by echocardiogram in April 2018.  He was last seen 02/08/2018.  Prior to that visit, he reported a syncopal episode related to dehydration in May 2019.    Mr. Nicholas Sutton returns for follow-up.  He is here alone.  He remains quite active.  He denies chest pain, shortness of breath, syncope.  He continues to note persistent fatigue.  He had lots of questions today regarding his exercise as well as his fatigue.  We discussed which physicians to follow-up with here in the future as well as whether or not to pursue cardioversion or evaluation with EP to discuss atrial fibrillation ablation.  Prior CV studies:   The following studies were reviewed today:  Echo 10/21/17 Mild focal basal septal hypertrophy, EF 60-65, moderate aortic stenosis (mean 20, peak 43), mild aortic insufficiency, MAC, mild MR, mild LAE, moderate RAE, moderate TR, PASP 38  Cardiac catheterization 10/10/2016 LM normal LAD mid 10; D1 ostial 20 LCx okay RCA normal Findings: 1. Mild, non-obstructive coronary artery disease. 2. Normal left and right heart filling pressures. 3. Low normal Fick cardiac output/index. 4. Moderate aortic stenosis. 5. Mild aortic regurgitation. 6. Mildly reduced left ventricular contraction (LVEF 45-50%). Recommendations: 1.  Medical therapy for non-ischemic cardiomyopathy; will continue carvedilol and add losartan. 2. Outpatient follow-up of atrial fibrillation and aortic valve disease.  Nuclear stress test 08/27/16 EF 57, inferoseptal infarct with peri-infarct ischemia, low risk  Echo 08/08/2016  EF 35-40, normal wall motion, mild aortic stenosis (mean 17, peak 29), mild AI, mild RV dilation  Past Medical History:  Diagnosis Date  . Anxiety   . Atrial fibrillation ([place])   . Diarrhea   . Diverticulitis   . Erectile dysfunction   . 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 Headache(784.0)    x 3 - 4 per year  . Heart murmur    has had no problems   . Nonischemic cardiomyopathy ([place])    Surgical Hx: The patient  has a past surgical history that includes Appendectomy; Gum surgery; Colostomy revision (N/A, 07/06/2013); Fistulotomy (N/A, 07/06/2013); Hernia repair; Colon resection; Inguinal hernia repair (Left, 10/10/2015); Insertion of mesh (Left, 10/10/2015); and RIGHT/LEFT HEART CATH AND CORONARY ANGIOGRAPHY (N/A, 10/09/2016).   Current Medications: Current Meds  Medication Sig  . acetaminophen (TYLENOL) 500 MG tablet Take 1,000 mg by mouth every 6 (six) hours as needed for headache.  [name] apixaban (ELIQUIS) 5 MG TABS tablet Take 1 tablet (5 mg total) by mouth 2 (two) times daily.  . Ascorbic Acid (VITAMIN C) 1000 MG tablet Take 1,000 mg by mouth 2 (two) times daily.   . carvedilol (COREG) 3.125 MG tablet TAKE 1 TABLET BY MOUTH TWICE DAILY  . cetirizine (ZYRTEC) 10 MG tablet Take 10 mg by mouth at bedtime.   [name]  losartan (COZAAR) 25 MG tablet TAKE 1 TABLET BY MOUTH ONCE DAILY  . Omega-3 Fatty Acids (FISH OIL) 1000 MG CPDR Take 1 capsule by mouth 2 (two) times daily.      Allergies:   Dust mite extract and Pollen extract   Social History   Tobacco Use  . Smoking status: Former Smoker    Packs/day: 1.50    Years: 30.00    Pack years:  45.00    Types: Cigarettes    Last attempt to quit: 06/03/2008    Years since quitting: 9.8  . Smokeless tobacco: Never Used  Substance Use Topics  . Alcohol use: No    Comment: rare  . Drug use: No     Family Hx: The patient's family history includes Heart attack (age of onset: 75) in his father; Hypertension in his unknown relative; Stroke (age of onset: 72) in his mother.  ROS:   Please see the history of present illness.    Review of Systems  Cardiovascular: Positive for dyspnea on exertion and irregular heartbeat.  Neurological: Positive for headaches.   All other systems reviewed and are negative.   EKGs/Labs/Other Test Reviewed:    EKG:  EKG is  ordered today.  The ekg ordered today demonstrates atrial fibrillation, heart rate 79, normal axis  Recent Labs: 04/14/2017: ALT 16; BUN 17; Creatinine, Ser 0.91; Hemoglobin 15.0; Platelets 184; Potassium 4.3; Sodium 139   Recent Lipid Panel No results found for: CHOL, TRIG, HDL, CHOLHDL, LDLCALC, LDLDIRECT  Physical Exam:    VS:  BP 108/66   Pulse 79   Ht 6' (1.829 m)   Wt 184 lb (83.5 kg)   SpO2 97%   BMI 24.95 kg/m     Wt Readings from Last 3 Encounters:  03/23/18 184 lb (83.5 kg)  02/08/18 181 lb 6.4 oz (82.3 kg)  11/06/17 180 lb (81.6 kg)     Physical Exam  Constitutional: He is oriented to person, place, and time. He appears well-developed and well-nourished. No distress.  HENT:  Head: Normocephalic and atraumatic.  Eyes: No scleral icterus.  Neck: No JVD present. No thyromegaly present.  Cardiovascular: Normal rate, S1 normal and S2 normal. An irregularly irregular rhythm present.  No murmur heard. Pulmonary/Chest: Effort normal. He has no wheezes. He has no rales.  Abdominal: Soft. He exhibits no distension.  Musculoskeletal: He exhibits no edema.  Lymphadenopathy:    He has no cervical adenopathy.  Neurological: He is alert and oriented to person, place, and time.  Skin: Skin is warm and dry.    Psychiatric: He has a normal mood and affect.    ASSESSMENT & PLAN:    Persistent atrial fibrillation (HCC)  Heart rate controlled.  He continues to experience fatigue.  We discussed possibly pursuing cardioversion.  I discussed the risks and benefits with him today.  He would like to think about this further and contact me if he decides to proceed.  He would also like to consider whether or not to pursue evaluation with EP to discuss ablation.  Going forward, in light of Dr. Darnelle Bos move to [place], I can have him follow-up here in the [place] office with either me or 1 of the other physicians on our care team.  We discussed this today.  Continue Apixaban.  Obtain BMET, CBC today.   Moderate aortic stenosis Moderate aortic stenosis by echocardiogram April 2019.  He will need a follow-up echocardiogram in April 2020.  NICM (nonischemic cardiomyopathy) ([place]) EF improved to normal  by most recent echocardiogram.  Continue current dose of beta-blocker, ARB.  Coronary artery disease involving native coronary artery of native heart without angina pectoris Mild nonobstructive coronary disease by cardiac catheterization in 2018.  He is not on aspirin as he is on Apixaban.  Dispo:  Return in about 6 months (around 09/21/2018) for Routine Follow Up, w/ Richardson Dopp, PA-C.   Medication Adjustments/Labs and Tests Ordered: Current medicines are reviewed at length with the patient today.  Concerns regarding medicines are outlined above.  Tests Ordered: Orders Placed This Encounter  Procedures  . Basic Metabolic Panel (BMET)  . CBC  . EKG 12-Lead   Medication Changes: No orders of the defined types were placed in this encounter.   Signed, Richardson Dopp, PA-C  03/23/2018 10:18 AM    [facility] [place], [place], [place]  [address] Phone: [phone]; Fax: [phone]

## 2018-04-23 ENCOUNTER — Ambulatory Visit: Payer: Medicare Other | Admitting: Internal Medicine

## 2018-05-10 ENCOUNTER — Ambulatory Visit: Payer: Medicare Other | Admitting: Internal Medicine

## 2018-05-18 ENCOUNTER — Other Ambulatory Visit: Payer: Self-pay | Admitting: Internal Medicine

## 2018-05-18 NOTE — Telephone Encounter (Signed)
Refill Request.  

## 2018-06-10 ENCOUNTER — Other Ambulatory Visit: Payer: Self-pay

## 2018-06-14 DIAGNOSIS — R946 Abnormal results of thyroid function studies: Secondary | ICD-10-CM | POA: Diagnosis not present

## 2018-06-14 DIAGNOSIS — E663 Overweight: Secondary | ICD-10-CM | POA: Diagnosis not present

## 2018-06-14 DIAGNOSIS — N529 Male erectile dysfunction, unspecified: Secondary | ICD-10-CM | POA: Diagnosis not present

## 2018-06-14 DIAGNOSIS — K5732 Diverticulitis of large intestine without perforation or abscess without bleeding: Secondary | ICD-10-CM | POA: Diagnosis not present

## 2018-06-14 DIAGNOSIS — K4090 Unilateral inguinal hernia, without obstruction or gangrene, not specified as recurrent: Secondary | ICD-10-CM | POA: Diagnosis not present

## 2018-06-14 DIAGNOSIS — Z0001 Encounter for general adult medical examination with abnormal findings: Secondary | ICD-10-CM | POA: Diagnosis not present

## 2018-06-14 DIAGNOSIS — Z6824 Body mass index (BMI) 24.0-24.9, adult: Secondary | ICD-10-CM | POA: Diagnosis not present

## 2018-06-14 DIAGNOSIS — Z1389 Encounter for screening for other disorder: Secondary | ICD-10-CM | POA: Diagnosis not present

## 2018-06-14 DIAGNOSIS — E785 Hyperlipidemia, unspecified: Secondary | ICD-10-CM | POA: Diagnosis not present

## 2018-06-14 DIAGNOSIS — Z125 Encounter for screening for malignant neoplasm of prostate: Secondary | ICD-10-CM | POA: Diagnosis not present

## 2018-06-14 DIAGNOSIS — Z23 Encounter for immunization: Secondary | ICD-10-CM | POA: Diagnosis not present

## 2018-06-14 DIAGNOSIS — I4891 Unspecified atrial fibrillation: Secondary | ICD-10-CM | POA: Diagnosis not present

## 2018-06-14 DIAGNOSIS — I2510 Atherosclerotic heart disease of native coronary artery without angina pectoris: Secondary | ICD-10-CM | POA: Diagnosis not present

## 2018-06-30 DIAGNOSIS — M7712 Lateral epicondylitis, left elbow: Secondary | ICD-10-CM | POA: Diagnosis not present

## 2018-06-30 DIAGNOSIS — M25522 Pain in left elbow: Secondary | ICD-10-CM | POA: Diagnosis not present

## 2018-08-13 ENCOUNTER — Other Ambulatory Visit: Payer: Self-pay | Admitting: *Deleted

## 2018-08-13 MED ORDER — APIXABAN 5 MG PO TABS: 5.0000 mg | ORAL_TABLET | Freq: Two times a day (BID) | ORAL | 1 refills | Status: DC

## 2018-09-20 ENCOUNTER — Other Ambulatory Visit: Payer: Self-pay | Admitting: Physician Assistant

## 2018-09-20 DIAGNOSIS — I428 Other cardiomyopathies: Secondary | ICD-10-CM

## 2018-09-20 MED ORDER — LOSARTAN POTASSIUM 25 MG PO TABS: 25.0000 mg | ORAL_TABLET | Freq: Every day | ORAL | 1 refills | Status: DC

## 2019-01-11 ENCOUNTER — Other Ambulatory Visit: Payer: Self-pay | Admitting: Physician Assistant

## 2019-01-11 MED ORDER — CARVEDILOL 3.125 MG PO TABS: 3.1250 mg | ORAL_TABLET | Freq: Two times a day (BID) | ORAL | 0 refills | Status: DC

## 2019-02-03 ENCOUNTER — Telehealth: Payer: Self-pay

## 2019-02-03 NOTE — Telephone Encounter (Signed)
   Appointment with Dr. Pamella Pert [phone] at 10:40   COVID-19 Pre-Screening Questions:  . In the past 7 to 10 days have you had a cough,  shortness of breath, headache, congestion, fever (100 or greater) body aches, chills, sore throat, or sudden loss of taste or sense of smell?--no . Have you been around anyone with known Covid 19. no . Have you been around anyone who is awaiting Covid 19 test results in the past 7 to 10 days? no . Have you been around anyone who has been exposed to Covid 19, or has mentioned symptoms of Covid 19 within the past 7 to 10 days? no      Pt aware to contact office if answers to any of these questions change prior to appointment. Pt has mask and is aware to wear to appointment.  He is aware to arrive 15 minutes prior to appointment and that no one can accompany him into appointment.

## 2019-02-07 ENCOUNTER — Encounter: Payer: Self-pay | Admitting: Cardiovascular Disease

## 2019-02-07 ENCOUNTER — Other Ambulatory Visit: Payer: Self-pay

## 2019-02-07 ENCOUNTER — Ambulatory Visit (INDEPENDENT_AMBULATORY_CARE_PROVIDER_SITE_OTHER): Payer: Medicare Other | Admitting: Cardiovascular Disease

## 2019-02-07 VITALS — BP 118/84 | HR 89 | Ht 72.0 in | Wt 178.1 lb

## 2019-02-07 DIAGNOSIS — I4819 Other persistent atrial fibrillation: Secondary | ICD-10-CM | POA: Diagnosis not present

## 2019-02-07 DIAGNOSIS — I350 Nonrheumatic aortic (valve) stenosis: Secondary | ICD-10-CM

## 2019-02-07 DIAGNOSIS — I428 Other cardiomyopathies: Secondary | ICD-10-CM

## 2019-02-07 NOTE — Patient Instructions (Signed)
Medication Instructions:  Your physician recommends that you continue on your current medications as directed. Please refer to the Current Medication list given to you today.  If you need a refill on your cardiac medications before your next appointment, please call your pharmacy.   Lab work: None If you have labs (blood work) drawn today and your tests are completely normal, you will receive your results only by: [name] MyChart Message (if you have MyChart) OR . A paper copy in the mail If you have any lab test that is abnormal or we need to change your treatment, we will call you to review the results.  Testing/Procedures: Your physician has requested that you have an echocardiogram. Echocardiography is a painless test that uses sound waves to create images of your heart. It provides your doctor with information about the size and shape of your heart and how well your heart's chambers and valves are working. This procedure takes approximately one hour. There are no restrictions for this procedure.    Follow-Up: At [facility], you and your health needs are our priority.  As part of our continuing mission to provide you with exceptional heart care, we have created designated Provider Care Teams.  These Care Teams include your primary Cardiologist (physician) and Advanced Practice Providers (APPs -  Physician Assistants and Nurse Practitioners) who all work together to provide you with the care you need, when you need it. You will need a follow up appointment in 4 months.  Please call our office 2 months in advance to schedule this appointment.  You may see Dr. Angelena Form or one of the following Advanced Practice Providers on your designated Care Team:   [place], PA-C Melina Copa, PA-C . Ermalinda Barrios, PA-C  Any Other Special Instructions Will Be Listed Below (If Applicable).

## 2019-02-07 NOTE — Progress Notes (Signed)
Chief Complaint  Patient presents with  . Follow-up    CAD   History of Present Illness:73 yo male with history of non-ischemic cardiomyopathy, persistent atrial fibrillation, moderate aortic stenosis, mild aortic insufficiency and CAD who is here today for follow up. He has been followed in our office by Dr. Saunders Revel. I am meeting him for the first time today. Echo in 2018 with LVEF=35-40%. Cardiac cath March 2018 with mild CAD and normal LVEF. He has persistent atrial fibrillation and is on Eliquis and Coreg. He is known to have moderate aortic stenosis by echo April 2019.   He is here today for follow up. The patient denies any chest pain, dyspnea, palpitations, lower extremity edema, orthopnea, PND, dizziness, near syncope or syncope.   Primary Care Physician: Redmond School, MD  Past Medical History:  Diagnosis Date  . Anxiety   . Atrial fibrillation ([place])   . Diarrhea   . Diverticulitis   . Erectile dysfunction   . 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 Headache(784.0)    x 3 - 4 per year  . Heart murmur    has had no problems   . Nonischemic cardiomyopathy [facility])     Past Surgical History:  Procedure Laterality Date  . APPENDECTOMY    . COLON RESECTION    . COLOSTOMY REVISION N/A 07/06/2013   Procedure: COLON RESECTION SIGMOID;  Surgeon: Edward Jolly, MD;  Location: [facility];  Service: General;  Laterality: N/A;  . FISTULOTOMY N/A 07/06/2013   Procedure: repair of colovesicle fistula/ FISTULOTOMY;  Surgeon: Edward Jolly, MD;  Location: [facility];  Service: General;  Laterality: N/A;  . GUM SURGERY    . HERNIA REPAIR    . INGUINAL HERNIA REPAIR Left 10/10/2015   Procedure: LEFT INGUINAL HERNIA REPAIR ;  Surgeon: Donnie Mesa, MD;  Location: [facility];  Service: General;  Laterality: Left;  . INSERTION OF MESH Left 10/10/2015   Procedure: INSERTION OF MESH;  Surgeon: Donnie Mesa, MD;  Location: [facility];  Service: General;  Laterality: Left;  . RIGHT/LEFT HEART CATH AND CORONARY ANGIOGRAPHY N/A 10/09/2016   Procedure: Right/Left Heart Cath and Coronary Angiography;  Surgeon: Nelva Bush, MD;  Location: [facility];  Service: Cardiovascular;  Laterality: N/A;    Current Outpatient Medications  Medication Sig Dispense Refill  . acetaminophen (TYLENOL) 500 MG tablet Take 1,000 mg by mouth every 6 (six) hours as needed for headache.    [name] apixaban (ELIQUIS) 5 MG TABS tablet Take 1 tablet (5 mg total) by mouth 2 (two) times daily. 180 tablet 1  . Ascorbic Acid (VITAMIN C) 1000 MG tablet Take 1,000 mg by mouth 2 (two) times daily.     . carvedilol (COREG) 3.125 MG tablet Take 1 tablet (3.125 mg total) by mouth 2 (two) times daily. Please make yearly appt for September for future refills. 1st attempt 180 tablet 0  . cetirizine (ZYRTEC) 10 MG tablet Take 10 mg by mouth at bedtime.     [name] losartan (COZAAR) 25 MG tablet Take 1 tablet (25 mg total) by mouth daily. 90 tablet 1  . Omega-3 Fatty Acids (FISH OIL) 1000 MG CPDR Take 1 capsule by mouth 2 (two) times daily.     . tadalafil (CIALIS) 5 MG tablet Take 1 tablet by mouth as needed for erectile dysfunction.     No current facility-administered medications for this visit.     Allergies  Allergen Reactions  . Dust Mite Extract Other (See Comments)    Sneezing  . Pollen Extract Other (See Comments)    Sneezing    Social History   Socioeconomic History  . Marital status: Divorced    Spouse name: Not on file  . Number of children: Not on file  . Years of education: Not on file  . Highest education level: Not on file  Occupational History  . Not on file  Social Needs  . Financial resource strain: Not on file  . Food insecurity    Worry: Not on file    Inability: Not on file  . Transportation needs    Medical: Not on file    Non-medical: Not on file  Tobacco Use  . Smoking status: Former Smoker    Packs/day: 1.50     Years: 30.00    Pack years: 45.00    Types: Cigarettes    Quit date: 06/03/2008    Years since quitting: 10.6  . Smokeless tobacco: Never Used  Substance and Sexual Activity  . Alcohol use: No    Comment: rare  . Drug use: No  . Sexual activity: Never    Birth control/protection: None  Lifestyle  . Physical activity    Days per week: Not on file    Minutes per session: Not on file  . Stress: Not on file  Relationships  . Social Herbalist on phone: Not on file    Gets together: Not on file    Attends religious service: Not on file    Active member of club or organization: Not on file    Attends meetings of clubs or organizations: Not on file    Relationship status: Not on file  . Intimate partner violence    Fear of current or ex partner: Not on file    Emotionally abused: Not on file    Physically abused: Not on file    Forced sexual activity: Not on file  Other Topics Concern  . Not on file  Social History Narrative  . Not on file    Family History  Problem Relation Age of Onset  . Stroke Mother 37  . Heart attack Father 81  . Hypertension Unknown     Review of Systems:  As stated in the HPI and otherwise negative.   BP 118/84   Pulse 89   Ht 6' (1.829 m)   Wt 178 lb 1.9 oz (80.8 kg)   SpO2 98%   BMI 24.16 kg/m   Physical Examination: General: Well developed, well nourished, NAD  HEENT: OP clear, mucus membranes moist  SKIN: warm, dry. No rashes. Neuro: No focal deficits  Musculoskeletal: Muscle strength 5/5 all ext  Psychiatric: Mood and affect normal  Neck: No JVD, no carotid bruits, no thyromegaly, no lymphadenopathy.  Lungs:Clear bilaterally, no wheezes, rhonci, crackles Cardiovascular: Irreg irreg. Systolic murmur.  Abdomen:Soft. Bowel sounds present. Non-tender.  Extremities: No lower extremity edema. Pulses are 2 + in the bilateral DP/PT.  EKG:  EKG is ordered today. The ekg ordered today demonstrates Atrial fibirllation, rate 89  bpm.   Echo 10/21/17 Mild focal basal septal hypertrophy, EF 60-65, moderate aortic stenosis (mean 20, peak 43), mild aortic insufficiency, MAC, mild MR, mild LAE, moderate RAE, moderate TR, PASP 38  Cardiac catheterization 10/10/2016 LM normal LAD mid 10; D1 ostial 20 LCx okay RCA normal Findings: 1. Mild, non-obstructive coronary artery disease. 2. Normal left and right heart filling pressures. 3.  Low normal Fick cardiac output/index. 4. Moderate aortic stenosis. 5. Mild aortic regurgitation. 6. Mildly reduced left ventricular contraction (LVEF 45-50%).  Recent Labs: 03/23/2018: BUN 20; Creatinine, Ser 0.99; Hemoglobin 14.3; Platelets 167; Potassium 4.6; Sodium 137   Lipid Panel No results found for: CHOL, TRIG, HDL, CHOLHDL, VLDL, LDLCALC, LDLDIRECT   Wt Readings from Last 3 Encounters:  02/07/19 178 lb 1.9 oz (80.8 kg)  03/23/18 184 lb (83.5 kg)  02/08/18 181 lb 6.4 oz (82.3 kg)     Other studies Reviewed: Additional studies/ records that were reviewed today include:  Review of the above records demonstrates:    Assessment and Plan:   1. Atrial fibrillation, persistent: Rate controlled. Continue Coreg and Eliquis. Consider ablation   2. Non-ischemic cardiomyopathy: LV function normal by echo in 2019. Repeat echo now.   3. Aortic stenosis:  Moderate by echo April 2019. (mean gradient 20 mmHg, DI 0.27). Repeat echo now.   Current medicines are reviewed at length with the patient today.  The patient does not have concerns regarding medicines.  The following changes have been made:  no change  Labs/ tests ordered today include:   Orders Placed This Encounter  Procedures  . EKG 12-Lead  . ECHOCARDIOGRAM COMPLETE   I spent 45 minutes today answering multiple questions.   Disposition:   FU with me in 3-4 months   Signed, Lauree Chandler, MD 02/07/2019 11:39 AM    [facility] [address], [place], [place]  [address] Phone: [phone]; Fax: [phone]

## 2019-02-08 ENCOUNTER — Other Ambulatory Visit: Payer: Self-pay

## 2019-02-08 MED ORDER — APIXABAN 5 MG PO TABS: 5.0000 mg | ORAL_TABLET | Freq: Two times a day (BID) | ORAL | 1 refills | Status: DC

## 2019-02-08 NOTE — Telephone Encounter (Signed)
Faxed refill request received from [facility] in [place].  Pt last saw Dr Angelena Form 02/07/19, last labs 03/23/18 Creat 0.99, age 73, weight 80.8kg, based on specified criteria pt is on appropriate dosage of Eliquis 5mg  BID.  Will refill rx.

## 2019-02-14 ENCOUNTER — Telehealth: Payer: Self-pay | Admitting: *Deleted

## 2019-02-14 ENCOUNTER — Other Ambulatory Visit: Payer: Self-pay

## 2019-02-14 ENCOUNTER — Ambulatory Visit (HOSPITAL_COMMUNITY): Payer: Medicare Other | Attending: Cardiology

## 2019-02-14 DIAGNOSIS — I4819 Other persistent atrial fibrillation: Secondary | ICD-10-CM | POA: Diagnosis not present

## 2019-02-14 DIAGNOSIS — I350 Nonrheumatic aortic (valve) stenosis: Secondary | ICD-10-CM | POA: Insufficient documentation

## 2019-02-24 ENCOUNTER — Other Ambulatory Visit: Payer: Self-pay | Admitting: Physician Assistant

## 2019-02-24 DIAGNOSIS — I428 Other cardiomyopathies: Secondary | ICD-10-CM

## 2019-02-25 DIAGNOSIS — H25813 Combined forms of age-related cataract, bilateral: Secondary | ICD-10-CM | POA: Diagnosis not present

## 2019-02-25 DIAGNOSIS — H353131 Nonexudative age-related macular degeneration, bilateral, early dry stage: Secondary | ICD-10-CM | POA: Diagnosis not present

## 2019-02-25 DIAGNOSIS — H43812 Vitreous degeneration, left eye: Secondary | ICD-10-CM | POA: Diagnosis not present

## 2019-03-03 ENCOUNTER — Other Ambulatory Visit: Payer: Self-pay

## 2019-03-03 ENCOUNTER — Ambulatory Visit (HOSPITAL_COMMUNITY)
Admission: RE | Admit: 2019-03-03 | Discharge: 2019-03-03 | Disposition: A | Discharge status: Home / Self Care | Payer: Medicare Other | Source: Ambulatory Visit | Attending: Family Medicine | Admitting: Family Medicine

## 2019-03-03 ENCOUNTER — Other Ambulatory Visit (HOSPITAL_COMMUNITY): Payer: Self-pay | Admitting: Family Medicine

## 2019-03-03 DIAGNOSIS — M546 Pain in thoracic spine: Secondary | ICD-10-CM | POA: Insufficient documentation

## 2019-03-03 DIAGNOSIS — Z6824 Body mass index (BMI) 24.0-24.9, adult: Secondary | ICD-10-CM | POA: Diagnosis not present

## 2019-03-03 DIAGNOSIS — Z1389 Encounter for screening for other disorder: Secondary | ICD-10-CM | POA: Diagnosis not present

## 2019-03-03 DIAGNOSIS — M47814 Spondylosis without myelopathy or radiculopathy, thoracic region: Secondary | ICD-10-CM | POA: Diagnosis not present

## 2019-03-21 DIAGNOSIS — H43812 Vitreous degeneration, left eye: Secondary | ICD-10-CM | POA: Diagnosis not present

## 2019-03-21 DIAGNOSIS — H25813 Combined forms of age-related cataract, bilateral: Secondary | ICD-10-CM | POA: Diagnosis not present

## 2019-04-12 ENCOUNTER — Other Ambulatory Visit: Payer: Self-pay | Admitting: Physician Assistant

## 2019-06-08 ENCOUNTER — Ambulatory Visit (INDEPENDENT_AMBULATORY_CARE_PROVIDER_SITE_OTHER): Payer: Medicare Other | Admitting: Cardiovascular Disease

## 2019-06-08 ENCOUNTER — Other Ambulatory Visit: Payer: Self-pay

## 2019-06-08 ENCOUNTER — Encounter: Payer: Self-pay | Admitting: Cardiovascular Disease

## 2019-06-08 VITALS — BP 130/80 | HR 88 | Ht 72.0 in | Wt 175.0 lb

## 2019-06-08 DIAGNOSIS — I2510 Atherosclerotic heart disease of native coronary artery without angina pectoris: Secondary | ICD-10-CM

## 2019-06-08 DIAGNOSIS — I428 Other cardiomyopathies: Secondary | ICD-10-CM | POA: Diagnosis not present

## 2019-06-08 DIAGNOSIS — I4819 Other persistent atrial fibrillation: Secondary | ICD-10-CM

## 2019-06-08 DIAGNOSIS — I350 Nonrheumatic aortic (valve) stenosis: Secondary | ICD-10-CM | POA: Diagnosis not present

## 2019-06-08 DIAGNOSIS — E7800 Pure hypercholesterolemia, unspecified: Secondary | ICD-10-CM

## 2019-06-08 MED ORDER — ROSUVASTATIN CALCIUM 10 MG PO TABS: 10.0000 mg | ORAL_TABLET | Freq: Every day | ORAL | 3 refills | Status: DC

## 2019-06-08 NOTE — Patient Instructions (Addendum)
Medication Instructions:  Your physician has recommended you make the following change in your medication:  1.) start rosuvastatin (Crestor) 10 mg once a day for Lipids  *If you need a refill on your cardiac medications before your next appointment, please call your pharmacy*  Lab Work: In 12 weeks - fasting lipids and liver function tests If you have labs (blood work) drawn today and your tests are completely normal, you will receive your results only by: [name] MyChart Message (if you have MyChart) OR . A paper copy in the mail If you have any lab test that is abnormal or we need to change your treatment, we will call you to review the results.  Testing/Procedures: Your physician has requested that you have an echocardiogram. Echocardiography is a painless test that uses sound waves to create images of your heart. It provides your doctor with information about the size and shape of your heart and how well your heart's chambers and valves are working. This procedure takes approximately one hour. There are no restrictions for this procedure.  Please schedule for July 2021   Follow-Up: At [facility], you and your health needs are our priority.  As part of our continuing mission to provide you with exceptional heart care, we have created designated Provider Care Teams.  These Care Teams include your primary Cardiologist (physician) and Advanced Practice Providers (APPs -  Physician Assistants and Nurse Practitioners) who all work together to provide you with the care you need, when you need it.  Your next appointment:   12 month(s)  The format for your next appointment:   In Person  Provider:   Lauree Chandler, MD  Other Instructions

## 2019-06-08 NOTE — Progress Notes (Signed)
Chief Complaint  Patient presents with  . Follow-up    CAD   History of Present Illness: 73 yo male with history of non-ischemic cardiomyopathy, persistent atrial fibrillation, moderate aortic stenosis, mild aortic insufficiency and CAD who is here today for follow up. He has been followed in our office by Dr. Saunders Revel. I met him for the first time in July 2020. Echo in January 2018 with LVEF=35-40%. Cardiac cath March 2018 with mild CAD and normal LVEF. He has persistent atrial fibrillation and is on Eliquis and Coreg. He has moderate aortic stenosis. Echo July 2020 with LVEF=55-60%, moderate aortic stenosis (mean gradient 19 mmHg, dimensionless index 0.33), mild AI.   He is here today for follow up. The patient denies any chest pain, dyspnea, palpitations, lower extremity edema, orthopnea, PND, dizziness, near syncope or syncope.   Primary Care Physician: Redmond School, MD  Past Medical History:  Diagnosis Date  . Anxiety   . Atrial fibrillation ([place])   . Diarrhea   . Diverticulitis   . Erectile dysfunction   . 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 Headache(784.0)    x 3 - 4 per year  . Heart murmur    has had no problems   . Nonischemic cardiomyopathy [facility])     Past Surgical History:  Procedure Laterality Date  . APPENDECTOMY    . COLON RESECTION    . COLOSTOMY REVISION N/A 07/06/2013   Procedure: COLON RESECTION SIGMOID;  Surgeon: Edward Jolly, MD;  Location: [facility];  Service: General;  Laterality: N/A;  . FISTULOTOMY N/A 07/06/2013   Procedure: repair of colovesicle fistula/ FISTULOTOMY;  Surgeon: Edward Jolly, MD;  Location: [facility];  Service: General;  Laterality: N/A;  . GUM SURGERY    . HERNIA REPAIR    . INGUINAL HERNIA REPAIR Left 10/10/2015   Procedure: LEFT INGUINAL HERNIA REPAIR ;  Surgeon: Donnie Mesa, MD;  Location: [place];  Service: General;  Laterality: Left;  . INSERTION OF  MESH Left 10/10/2015   Procedure: INSERTION OF MESH;  Surgeon: Donnie Mesa, MD;  Location: [place];  Service: General;  Laterality: Left;  . RIGHT/LEFT HEART CATH AND CORONARY ANGIOGRAPHY N/A 10/09/2016   Procedure: Right/Left Heart Cath and Coronary Angiography;  Surgeon: Nelva Bush, MD;  Location: [place] [facility];  Service: Cardiovascular;  Laterality: N/A;    Current Outpatient Medications  Medication Sig Dispense Refill  . acetaminophen (TYLENOL) 500 MG tablet Take 1,000 mg by mouth every 6 (six) hours as needed for headache.    [name] apixaban (ELIQUIS) 5 MG TABS tablet Take 1 tablet (5 mg total) by mouth 2 (two) times daily. 180 tablet 1  . Ascorbic Acid (VITAMIN C) 1000 MG tablet Take 1,000 mg by mouth 2 (two) times daily.     . carvedilol (COREG) 3.125 MG tablet TAKE 1 TABLET BY MOUTH TWICE DAILY 180 tablet 0  . cetirizine (ZYRTEC) 10 MG tablet Take 10 mg by mouth at bedtime.     [name] losartan (COZAAR) 25 MG tablet TAKE 1 TABLET(25 MG) BY MOUTH DAILY 90 tablet 1  . Omega-3 Fatty Acids (FISH OIL) 1000 MG CPDR Take 1 capsule by mouth 2 (two) times daily.     . tadalafil (CIALIS) 5 MG tablet Take 1 tablet by mouth as needed for erectile dysfunction.    . rosuvastatin (CRESTOR) 10 MG tablet Take 1 tablet (10 mg total) by mouth daily. 90 tablet 3  No current facility-administered medications for this visit.     Allergies  Allergen Reactions  . Dust Mite Extract Other (See Comments)    Sneezing  . Pollen Extract Other (See Comments)    Sneezing    Social History   Socioeconomic History  . Marital status: Divorced    Spouse name: Not on file  . Number of children: Not on file  . Years of education: Not on file  . Highest education level: Not on file  Occupational History  . Not on file  Social Needs  . Financial resource strain: Not on file  . Food insecurity    Worry: Not on file    Inability: Not on file  . Transportation needs    Medical: Not on file    Non-medical:  Not on file  Tobacco Use  . Smoking status: Former Smoker    Packs/day: 1.50    Years: 30.00    Pack years: 45.00    Types: Cigarettes    Quit date: 06/03/2008    Years since quitting: 11.0  . Smokeless tobacco: Never Used  Substance and Sexual Activity  . Alcohol use: No    Comment: rare  . Drug use: No  . Sexual activity: Never    Birth control/protection: None  Lifestyle  . Physical activity    Days per week: Not on file    Minutes per session: Not on file  . Stress: Not on file  Relationships  . Social Herbalist on phone: Not on file    Gets together: Not on file    Attends religious service: Not on file    Active member of club or organization: Not on file    Attends meetings of clubs or organizations: Not on file    Relationship status: Not on file  . Intimate partner violence    Fear of current or ex partner: Not on file    Emotionally abused: Not on file    Physically abused: Not on file    Forced sexual activity: Not on file  Other Topics Concern  . Not on file  Social History Narrative  . Not on file    Family History  Problem Relation Age of Onset  . Stroke Mother 9  . Heart attack Father 70  . Hypertension Unknown     Review of Systems:  As stated in the HPI and otherwise negative.   BP 130/80   Pulse 88   Ht 6' (1.829 m)   Wt 175 lb (79.4 kg)   SpO2 98%   BMI 23.73 kg/m   Physical Examination:  General: Well developed, well nourished, NAD  HEENT: OP clear, mucus membranes moist  SKIN: warm, dry. No rashes. Neuro: No focal deficits  Musculoskeletal: Muscle strength 5/5 all ext  Psychiatric: Mood and affect normal  Neck: No JVD, no carotid bruits, no thyromegaly, no lymphadenopathy.  Lungs:Clear bilaterally, no wheezes, rhonci, crackles Cardiovascular: Regular rate and rhythm. Soft systolic murmur.  Abdomen:Soft. Bowel sounds present. Non-tender.  Extremities: No lower extremity edema. Pulses are 2 + in the bilateral DP/PT.   EKG:  EKG is not ordered today. The ekg ordered today demonstrates   Echo July 2020:  1. The left ventricle has normal systolic function, with an ejection fraction of 55-60%. The cavity size was normal. Left ventricular diastolic function could not be evaluated secondary to atrial fibrillation.  2. The right ventricle has normal systolic function. The cavity was normal.  3. Left  atrial size was mildly dilated.  4. The mitral valve is abnormal. Mild thickening of the mitral valve leaflet. There is mild mitral annular calcification present.  5. The tricuspid valve is grossly normal. Tricuspid valve regurgitation is mild-moderate.  6. The aorta is normal in size and structure.  7. Normal LV systolic function; biatrial enlargement; heavily calcified aortic valve with moderate AS (mean gradient 46mHg; DI .33) and mild AI.  Cardiac catheterization 10/10/2016 LM normal LAD mid 10; D1 ostial 20 LCx okay RCA normal Findings: 1. Mild, non-obstructive coronary artery disease. 2. Normal left and right heart filling pressures. 3. Low normal Fick cardiac output/index. 4. Moderate aortic stenosis. 5. Mild aortic regurgitation. 6. Mildly reduced left ventricular contraction (LVEF 45-50%).  Recent Labs: No results found for requested labs within last 8760 hours.   Lipid Panel No results found for: CHOL, TRIG, HDL, CHOLHDL, VLDL, LDLCALC, LDLDIRECT   Wt Readings from Last 3 Encounters:  06/08/19 175 lb (79.4 kg)  02/07/19 178 lb 1.9 oz (80.8 kg)  03/23/18 184 lb (83.5 kg)     Other studies Reviewed: Additional studies/ records that were reviewed today include:  Review of the above records demonstrates:    Assessment and Plan:   1. Atrial fibrillation, persistent: He is in atrial fibrillation today. Rate is controlled. Continue Coreg and Eliquis.   2. Non-ischemic cardiomyopathy: LV function normal by echo July 2020  3. Aortic stenosis:  Moderate by echo July 2020. Will repeat  echo July 2021.   4. Hyperlipidemia: Will start Crestor 10 mg per day. Lipids and LFTs in 12 weeks.  5. CAD without angina: Mild CAD by cath in 2018. No ASA given use of Eliquis. Continue beta blocker. Start statin.   Current medicines are reviewed at length with the patient today.  The patient does not have concerns regarding medicines.  The following changes have been made:  no change  Labs/ tests ordered today include:   Orders Placed This Encounter  Procedures  . Hepatic function panel  . Lipid Profile  . ECHOCARDIOGRAM COMPLETE   I spent 45 minutes today answering multiple questions.   Disposition:   FU with me in 12 months   Signed, CLauree Chandler MD 06/08/2019 11:20 AM    [facility] [address] [place]  [address]Phone: [phone] Fax: [phone]

## 2019-07-01 DIAGNOSIS — Z1389 Encounter for screening for other disorder: Secondary | ICD-10-CM | POA: Diagnosis not present

## 2019-07-01 DIAGNOSIS — E039 Hypothyroidism, unspecified: Secondary | ICD-10-CM | POA: Diagnosis not present

## 2019-07-01 DIAGNOSIS — I4891 Unspecified atrial fibrillation: Secondary | ICD-10-CM | POA: Diagnosis not present

## 2019-07-01 DIAGNOSIS — K4090 Unilateral inguinal hernia, without obstruction or gangrene, not specified as recurrent: Secondary | ICD-10-CM | POA: Diagnosis not present

## 2019-07-01 DIAGNOSIS — Z0001 Encounter for general adult medical examination with abnormal findings: Secondary | ICD-10-CM | POA: Diagnosis not present

## 2019-07-01 DIAGNOSIS — K5732 Diverticulitis of large intestine without perforation or abscess without bleeding: Secondary | ICD-10-CM | POA: Diagnosis not present

## 2019-07-01 DIAGNOSIS — Z6823 Body mass index (BMI) 23.0-23.9, adult: Secondary | ICD-10-CM | POA: Diagnosis not present

## 2019-07-01 DIAGNOSIS — E663 Overweight: Secondary | ICD-10-CM | POA: Diagnosis not present

## 2019-07-01 DIAGNOSIS — E785 Hyperlipidemia, unspecified: Secondary | ICD-10-CM | POA: Diagnosis not present

## 2019-07-01 DIAGNOSIS — N528 Other male erectile dysfunction: Secondary | ICD-10-CM | POA: Diagnosis not present

## 2019-07-01 DIAGNOSIS — Z125 Encounter for screening for malignant neoplasm of prostate: Secondary | ICD-10-CM | POA: Diagnosis not present

## 2019-07-04 ENCOUNTER — Other Ambulatory Visit: Payer: Self-pay

## 2019-07-04 MED ORDER — CARVEDILOL 3.125 MG PO TABS: 3.1250 mg | ORAL_TABLET | Freq: Two times a day (BID) | ORAL | 3 refills | Status: DC

## 2019-07-06 ENCOUNTER — Encounter

## 2019-08-31 ENCOUNTER — Ambulatory Visit (HOSPITAL_COMMUNITY)
Admission: RE | Admit: 2019-08-31 | Discharge: 2019-08-31 | Disposition: A | Discharge status: Home / Self Care | Payer: Medicare Other | Source: Ambulatory Visit | Attending: Cardiovascular Disease | Admitting: Cardiovascular Disease

## 2019-08-31 ENCOUNTER — Other Ambulatory Visit: Payer: Self-pay

## 2019-08-31 DIAGNOSIS — I350 Nonrheumatic aortic (valve) stenosis: Secondary | ICD-10-CM | POA: Insufficient documentation

## 2019-08-31 DIAGNOSIS — E7800 Pure hypercholesterolemia, unspecified: Secondary | ICD-10-CM | POA: Diagnosis not present

## 2019-08-31 DIAGNOSIS — I2510 Atherosclerotic heart disease of native coronary artery without angina pectoris: Secondary | ICD-10-CM | POA: Diagnosis not present

## 2019-08-31 DIAGNOSIS — I428 Other cardiomyopathies: Secondary | ICD-10-CM | POA: Insufficient documentation

## 2019-08-31 DIAGNOSIS — I4819 Other persistent atrial fibrillation: Secondary | ICD-10-CM | POA: Diagnosis not present

## 2019-08-31 NOTE — Progress Notes (Signed)
*  PRELIMINARY RESULTS* Echocardiogram 2D Echocardiogram has been performed.  Samuel Germany 08/31/2019, 11:38 AM

## 2019-09-02 ENCOUNTER — Telehealth: Payer: Self-pay

## 2019-09-02 NOTE — Telephone Encounter (Signed)
-----   Message from Burnell Blanks, MD sent at 09/01/2019  1:38 PM EST ----- His heart is strong but his aortic valve stenosis has worsened. Can we let him know and see if we can get him in to see me over the next month or two? Thanks, chris

## 2019-09-02 NOTE — Telephone Encounter (Signed)
The patient has been notified of the result and verbalized understanding.  All questions (if any) were answered.  Appt made for 3/5 at 11:40am  Wilma Flavin, RN 09/02/2019 11:44 AM

## 2019-09-23 ENCOUNTER — Ambulatory Visit (INDEPENDENT_AMBULATORY_CARE_PROVIDER_SITE_OTHER): Payer: Medicare Other | Admitting: Cardiovascular Disease

## 2019-09-23 ENCOUNTER — Encounter: Payer: Self-pay | Admitting: Cardiovascular Disease

## 2019-09-23 ENCOUNTER — Other Ambulatory Visit: Payer: Self-pay

## 2019-09-23 VITALS — BP 122/82 | HR 108 | Ht 72.0 in | Wt 163.8 lb

## 2019-09-23 DIAGNOSIS — E7800 Pure hypercholesterolemia, unspecified: Secondary | ICD-10-CM | POA: Diagnosis not present

## 2019-09-23 DIAGNOSIS — I350 Nonrheumatic aortic (valve) stenosis: Secondary | ICD-10-CM

## 2019-09-23 DIAGNOSIS — I428 Other cardiomyopathies: Secondary | ICD-10-CM

## 2019-09-23 DIAGNOSIS — I2510 Atherosclerotic heart disease of native coronary artery without angina pectoris: Secondary | ICD-10-CM | POA: Diagnosis not present

## 2019-09-23 DIAGNOSIS — I4819 Other persistent atrial fibrillation: Secondary | ICD-10-CM | POA: Diagnosis not present

## 2019-09-23 NOTE — Progress Notes (Signed)
Chief Complaint  Patient presents with  . Follow-up    AS   History of Present Illness: 74 yo male with history of non-ischemic cardiomyopathy, persistent atrial fibrillation, moderate aortic stenosis, mild aortic insufficiency and CAD who is here today for follow up. He has been followed in our office by Dr. Saunders Revel. I met him for the first time in July 2020. Echo in January 2018 with LVEF=35-40%. Cardiac cath March 2018 with mild CAD and normal LVEF. He has persistent atrial fibrillation and is on Eliquis and Coreg. He has moderate aortic stenosis. Echo February 2021 with LVEF=55-60%, moderately severe AS with mean gradient 28 mmHg, peak gradient 47 mmHg, AVA 0.72 cm2 and dimensionless index 0.23. mild AI.   He is here today for follow up. The patient denies any chest pain, dyspnea, palpitations, lower extremity edema, orthopnea, PND, dizziness, near syncope or syncope.   Primary Care Physician: Redmond School, MD  Past Medical History:  Diagnosis Date  . Anxiety   . Atrial fibrillation ([place])   . Diarrhea   . Diverticulitis   . Erectile dysfunction   . 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 Headache(784.0)    x 3 - 4 per year  . Heart murmur    has had no problems   . Nonischemic cardiomyopathy [facility])     Past Surgical History:  Procedure Laterality Date  . APPENDECTOMY    . COLON RESECTION    . COLOSTOMY REVISION N/A 07/06/2013   Procedure: COLON RESECTION SIGMOID;  Surgeon: Edward Jolly, MD;  [facility];  Service: General;  Laterality: N/A;  . FISTULOTOMY N/A 07/06/2013   Procedure: repair of colovesicle fistula/ FISTULOTOMY;  Surgeon: Edward Jolly, MD;  [facility];  Service: General;  Laterality: N/A;  . GUM SURGERY    . HERNIA REPAIR    . INGUINAL HERNIA REPAIR Left 10/10/2015   Procedure: LEFT INGUINAL HERNIA REPAIR ;  Surgeon: Donnie Mesa, MD;  Location: [place];  Service:  General;  Laterality: Left;  . INSERTION OF MESH Left 10/10/2015   Procedure: INSERTION OF MESH;  Surgeon: Donnie Mesa, MD;  Location: [place];  Service: General;  Laterality: Left;  . RIGHT/LEFT HEART CATH AND CORONARY ANGIOGRAPHY N/A 10/09/2016   Procedure: Right/Left Heart Cath and Coronary Angiography;  Surgeon: Nelva Bush, MD;  Location: [place] [facility];  Service: Cardiovascular;  Laterality: N/A;    Current Outpatient Medications  Medication Sig Dispense Refill  . acetaminophen (TYLENOL) 500 MG tablet Take 1,000 mg by mouth every 6 (six) hours as needed for headache.    [name] apixaban (ELIQUIS) 5 MG TABS tablet Take 1 tablet (5 mg total) by mouth 2 (two) times daily. 180 tablet 1  . Ascorbic Acid (VITAMIN C) 1000 MG tablet Take 1,000 mg by mouth 2 (two) times daily.     . carvedilol (COREG) 3.125 MG tablet Take 1 tablet (3.125 mg total) by mouth 2 (two) times daily. 180 tablet 3  . cetirizine (ZYRTEC) 10 MG tablet Take 10 mg by mouth at bedtime.     [name] losartan (COZAAR) 25 MG tablet TAKE 1 TABLET(25 MG) BY MOUTH DAILY 90 tablet 1  . Omega-3 Fatty Acids (FISH OIL) 1000 MG CPDR Take 1 capsule by mouth 2 (two) times daily.     . tadalafil (CIALIS) 5 MG tablet Take 1 tablet by mouth as needed for erectile dysfunction.     No current facility-administered  medications for this visit.    Allergies  Allergen Reactions  . Dust Mite Extract Other (See Comments)    Sneezing  . Pollen Extract Other (See Comments)    Sneezing    Social History   Socioeconomic History  . Marital status: Divorced    Spouse name: Not on file  . Number of children: Not on file  . Years of education: Not on file  . Highest education level: Not on file  Occupational History  . Not on file  Tobacco Use  . Smoking status: Former Smoker    Packs/day: 1.50    Years: 30.00    Pack years: 45.00    Types: Cigarettes    Quit date: 06/03/2008    Years since quitting: 11.3  . Smokeless tobacco: Never Used    Substance and Sexual Activity  . Alcohol use: No    Comment: rare  . Drug use: No  . Sexual activity: Never    Birth control/protection: None  Other Topics Concern  . Not on file  Social History Narrative  . Not on file   Social Determinants of Health   Financial Resource Strain:   . Difficulty of Paying Living Expenses: Not on file  Food Insecurity:   . Worried About Charity fundraiser in the Last Year: Not on file  . Ran Out of Food in the Last Year: Not on file  Transportation Needs:   . Lack of Transportation (Medical): Not on file  . Lack of Transportation (Non-Medical): Not on file  Physical Activity:   . Days of Exercise per Week: Not on file  . Minutes of Exercise per Session: Not on file  Stress:   . Feeling of Stress : Not on file  Social Connections:   . Frequency of Communication with Friends and Family: Not on file  . Frequency of Social Gatherings with Friends and Family: Not on file  . Attends Religious Services: Not on file  . Active Member of Clubs or Organizations: Not on file  . Attends Archivist Meetings: Not on file  . Marital Status: Not on file  Intimate Partner Violence:   . Fear of Current or Ex-Partner: Not on file  . Emotionally Abused: Not on file  . Physically Abused: Not on file  . Sexually Abused: Not on file    Family History  Problem Relation Age of Onset  . Stroke Mother 63  . Heart attack Father 25  . Hypertension Unknown     Review of Systems:  As stated in the HPI and otherwise negative.   BP 122/82   Pulse (!) 108   Ht 6' (1.829 m)   Wt 163 lb 12.8 oz (74.3 kg)   SpO2 99%   BMI 22.22 kg/m   Physical Examination:  General: Well developed, well nourished, NAD  HEENT: OP clear, mucus membranes moist  SKIN: warm, dry. No rashes. Neuro: No focal deficits  Musculoskeletal: Muscle strength 5/5 all ext  Psychiatric: Mood and affect normal  Neck: No JVD, no carotid bruits, no thyromegaly, no lymphadenopathy.   Lungs:Clear bilaterally, no wheezes, rhonci, crackles Cardiovascular: Regular rate and rhythm. Loud, harsh systolic murmur.  Abdomen:Soft. Bowel sounds present. Non-tender.  Extremities: No lower extremity edema. Pulses are 2 + in the bilateral DP/PT.  EKG:  EKG is not ordered today. The ekg ordered today demonstrates   Echo February 2021: 1. Left ventricular ejection fraction, by estimation, is 55 to 60%. The  left ventricle has normal  function. The left ventrical has no regional  wall motion abnormalities. Left ventricular diastolic parameters are  indeterminate.  2. Right ventricular systolic function is normal. The right ventricular  size is normal. There is mildly elevated pulmonary artery systolic  pressure.  3. Left atrial size was severely dilated.  4. Right atrial size was severely dilated.  5. The mitral valve is normal in structure and function. trivial mitral  valve regurgitation. No evidence of mitral stenosis.  6. The aortic valve has an indeterminant number of cusps. Aortic valve  regurgitation is mild. Mixed findings for aortic stenosis severity. From  review of prior reports there is a chronic discrepancy between calculated  valve area and valve gradients.  Morphologically the valve appears severe. Borderline stroke volume index  of 36 may lead to lower than expected gradients. Overall moderate to  severe aortic stenosis.Aortic valve mean gradient measures 28.0 mmHg.  Aortic valve peak gradient measures 47.0  mmHg. Aortic valve area, by VTI measures 0.72 cm. Dimensionless inex  0.23.  7. Mild pulmonary hypertension, PASP is 38 mmHg.  8. The inferior vena cava is normal in size with greater than 50%  respiratory variability, suggesting right atrial pressure of 3 mmHg.   Cardiac catheterization 10/10/2016 LM normal LAD mid 10; D1 ostial 20 LCx okay RCA normal Findings: 1. Mild, non-obstructive coronary artery disease. 2. Normal left and right heart  filling pressures. 3. Low normal Fick cardiac output/index. 4. Moderate aortic stenosis. 5. Mild aortic regurgitation. 6. Mildly reduced left ventricular contraction (LVEF 45-50%).  Recent Labs: No results found for requested labs within last 8760 hours.   Lipid Panel No results found for: CHOL, TRIG, HDL, CHOLHDL, VLDL, LDLCALC, LDLDIRECT   Wt Readings from Last 3 Encounters:  09/23/19 163 lb 12.8 oz (74.3 kg)  06/08/19 175 lb (79.4 kg)  02/07/19 178 lb 1.9 oz (80.8 kg)     Other studies Reviewed: Additional studies/ records that were reviewed today include:  Review of the above records demonstrates:    Assessment and Plan:   1. Atrial fibrillation, persistent: Rate controlled atrial fib today. Continue Eliquis and Coreg.    2. Non-ischemic cardiomyopathy: LV function normal by echo February 2021  3. Aortic stenosis:  Severe by echo February 2021. He is asymptomatic. I will repeat an echo in 6 months and see him after that. He will call with change in his symptoms.   4. Hyperlipidemia: He does not wish to take a statin. He had been on Crestor. He wishes to repeat lipids in 2 months after he makes more dietary changes. Goal LDL 70. Last LDL December 2020 137. I have advised him to consider a statin again.    5. CAD without angina: Mild CAD by cath in 2018. No ASA given use of Eliquis. Continue beta blocker and statin.    Current medicines are reviewed at length with the patient today.  The patient does not have concerns regarding medicines.  The following changes have been made:  no change  Labs/ tests ordered today include:   Orders Placed This Encounter  Procedures  . Lipid panel  . ECHOCARDIOGRAM COMPLETE   I spent 45 minutes today answering multiple questions.   Disposition:   FU with me in 6 months   Signed, Lauree Chandler, MD 09/23/2019 1:25 PM    [facility] [address], [place], [place]  [address] Phone: [phone];  Fax: [phone]

## 2019-09-23 NOTE — Patient Instructions (Signed)
Medication Instructions:  No changes *If you need a refill on your cardiac medications before your next appointment, please call your pharmacy*   Lab Work: In about 2 months - return for lab appointment to check lipids  Testing/Procedures: Your physician has requested that you have an echocardiogram. Echocardiography is a painless test that uses sound waves to create images of your heart. It provides your doctor with information about the size and shape of your heart and how well your heart's chambers and valves are working. This procedure takes approximately one hour. There are no restrictions for this procedure.   Follow-Up: At [facility], you and your health needs are our priority.  As part of our continuing mission to provide you with exceptional heart care, we have created designated Provider Care Teams.  These Care Teams include your primary Cardiologist (physician) and Advanced Practice Providers (APPs -  Physician Assistants and Nurse Practitioners) who all work together to provide you with the care you need, when you need it.  Your next appointment:   6 month(s)  The format for your next appointment:   In Person  Provider:   Lauree Chandler, MD   Other Instructions

## 2019-09-26 ENCOUNTER — Other Ambulatory Visit: Payer: Self-pay | Admitting: *Deleted

## 2019-09-26 ENCOUNTER — Other Ambulatory Visit: Payer: Self-pay

## 2019-09-26 DIAGNOSIS — I428 Other cardiomyopathies: Secondary | ICD-10-CM

## 2019-09-26 MED ORDER — APIXABAN 5 MG PO TABS: 5.0000 mg | ORAL_TABLET | Freq: Two times a day (BID) | ORAL | 2 refills | Status: DC

## 2019-09-26 MED ORDER — LOSARTAN POTASSIUM 25 MG PO TABS: ORAL_TABLET | ORAL | 3 refills | Status: DC

## 2019-09-26 NOTE — Telephone Encounter (Signed)
Eliquis 5mg  refill request received, pt is 74yrs old, weight-74.3kg, Crea-1.03 on 07/01/2019 via [facility] at [place], [place], and last seen by Dr. Angelena Form on 09/23/2019. Dose is appropriate based on dosing criteria. Will send in refill to requested pharmacy.

## 2019-09-29 ENCOUNTER — Telehealth: Payer: Self-pay | Admitting: Cardiovascular Disease

## 2019-09-29 DIAGNOSIS — R531 Weakness: Secondary | ICD-10-CM | POA: Diagnosis not present

## 2019-09-29 NOTE — Telephone Encounter (Signed)
Spoke with patient. No cp.  Exercised at normal pace but extra 5 min and laid down to rest.  Got sick, has vomited multiple times.  He cannot feel when he is in afib.  His BP monitor does not indicate irregular HR. Adv that he needs to call EMS. His friend is there with him.  Pt is hanging up and calling 911.

## 2019-09-29 NOTE — Telephone Encounter (Signed)
STAT if HR is under 50 or over 120 (normal HR is 60-100 beats per minute)  1) What is your heart rate? 141 BP 99/91  2) Do you have a log of your heart rate readings (document readings)?   3) Do you have any other symptoms? Patient was on the treadmill for 15 mins, he states he got off, laid down he felt and threw up a few times. Patient states he is on Eliquis, he wants to know if he should take an Aspirin. He feels weak and can shaky.

## 2019-10-02 NOTE — Telephone Encounter (Signed)
Hey. Can we follow up with him to see how he is doing? I do not see notes from the ED. Thanks, chris

## 2019-10-03 NOTE — Telephone Encounter (Signed)
Called patient for update.  He called EMS. CBS, BP, HR were ok.  Had EKG which showed afib.  States his heart rate was ok. Was weak and fatigued. They offered to transport to hospital but he declined. The next day he felt much better.  Feels he worked a little too hard the day before and then overdid it on the treadmill.  Is going to continue to exercise but with little bit less exertion.  "I learned my lesson."

## 2019-10-03 NOTE — Telephone Encounter (Signed)
Thanks

## 2019-10-12 ENCOUNTER — Other Ambulatory Visit (HOSPITAL_COMMUNITY): Payer: Medicare Other

## 2019-12-05 ENCOUNTER — Ambulatory Visit (INDEPENDENT_AMBULATORY_CARE_PROVIDER_SITE_OTHER): Payer: Medicare Other | Admitting: Podiatry

## 2019-12-05 ENCOUNTER — Encounter: Payer: Self-pay | Admitting: Podiatry

## 2019-12-05 ENCOUNTER — Other Ambulatory Visit: Payer: Self-pay

## 2019-12-05 DIAGNOSIS — L600 Ingrowing nail: Secondary | ICD-10-CM | POA: Diagnosis not present

## 2019-12-05 MED ORDER — GENTAMICIN SULFATE 0.1 % EX CREA: 1.0000 "application " | TOPICAL_CREAM | Freq: Two times a day (BID) | CUTANEOUS | 1 refills | Status: DC

## 2019-12-05 NOTE — Patient Instructions (Signed)

## 2019-12-08 NOTE — Progress Notes (Signed)
   Subjective: Patient presents today for evaluation of pain to the medial borders of the bilateral great toes that began suddenly several months ago. Patient is concerned for possible ingrown nail. Walking increases the pain. He has been soaking the toes in warm, salty water for treatment. He denies any known trauma or injury. Patient presents today for further treatment and evaluation.  Past Medical History:  Diagnosis Date  . Anxiety   . Atrial fibrillation ([place])   . Diarrhea   . Diverticulitis   . Erectile dysfunction   . 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 Headache(784.0)    x 3 - 4 per year  . Heart murmur    has had no problems   . Nonischemic cardiomyopathy ([place])     Objective:  General: Well developed, nourished, in no acute distress, alert and oriented x3   Dermatology: Skin is warm, dry and supple bilateral. Medial borders of the bilateral great toes appears to be erythematous with evidence of an ingrowing nail. Pain on palpation noted to the border of the nail fold. The remaining nails appear unremarkable at this time. There are no open sores, lesions.  Vascular: Dorsalis Pedis artery and Posterior Tibial artery pedal pulses palpable. No lower extremity edema noted.   Neruologic: Grossly intact via light touch bilateral.  Musculoskeletal: Muscular strength within normal limits in all groups bilateral. Normal range of motion noted to all pedal and ankle joints.   Assesement: #1 Paronychia with ingrowing nail medial borders bilateral great toes  #2 Pain in toe #3 Incurvated nail  Plan of Care:  1. Patient evaluated.  2. Discussed treatment alternatives and plan of care. Explained nail avulsion procedure and post procedure course to patient. 3. Patient opted for permanent partial nail avulsion of the medial borders bilateral great toes.  4. Prior to procedure, local anesthesia infiltration utilized  using 3 ml of a 50:50 mixture of 2% plain lidocaine and 0.5% plain marcaine in a normal hallux block fashion and a betadine prep performed.  5. Partial permanent nail avulsion with chemical matrixectomy performed using [id number] applications of phenol followed by alcohol flush.  6. Light dressing applied. 7. Prescription for Gentamicin cream provided to patient to use daily with a bandage.  8. Return to clinic in 2 weeks.  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   Edrick Kins, DPM [facility]  Dr. Edrick Kins, [address]                                        [place], [place] [address]                Office [phone]  Fax [phone]

## 2019-12-26 ENCOUNTER — Ambulatory Visit (INDEPENDENT_AMBULATORY_CARE_PROVIDER_SITE_OTHER): Payer: Medicare Other | Admitting: Podiatry

## 2019-12-26 ENCOUNTER — Other Ambulatory Visit: Payer: Self-pay

## 2019-12-26 DIAGNOSIS — L600 Ingrowing nail: Secondary | ICD-10-CM

## 2019-12-26 NOTE — Progress Notes (Signed)
   Subjective: 74 y.o. male presents today status post permanent nail avulsion procedure of the medial borders of bilateral great toes that was performed on 12/05/2019.   Past Medical History:  Diagnosis Date  . Anxiety   . Atrial fibrillation ([place])   . Diarrhea   . Diverticulitis   . Erectile dysfunction   . 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 Headache(784.0)    x 3 - 4 per year  . Heart murmur    has had no problems   . Nonischemic cardiomyopathy ([facility])     Objective: Skin is warm, dry and supple. Nail and respective nail fold appears to be healing appropriately. Open wound to the associated nail fold with a granular wound base and moderate amount of fibrotic tissue. Minimal drainage noted. Mild erythema around the periungual region likely due to phenol chemical matricectomy.  Assessment: #1 postop permanent partial nail avulsion medial border bilateral great toes #2 open wound periungual nail fold of respective digit.  #3 residual pain left great toe nail avulsion site  Plan of care: #1 patient was evaluated  #2 debridement of open wound was performed to the periungual border of the respective toe using a currette. Antibiotic ointment and Band-Aid was applied. #3 patient is to return to clinic in 3 weeks to follow-up with the left toenail avulsion site.  The patient is doing very well he can call and cancel his appointment  *Gross blueberries in [place], DPM [facility]  Dr. Edrick Kins, [address]                                        [place], [place] [address]                Office [phone]  Fax [phone]

## 2020-01-09 ENCOUNTER — Other Ambulatory Visit: Payer: Self-pay | Admitting: Cardiovascular Disease

## 2020-01-09 ENCOUNTER — Other Ambulatory Visit: Payer: Self-pay

## 2020-01-09 ENCOUNTER — Other Ambulatory Visit: Payer: Medicare Other | Admitting: *Deleted

## 2020-01-09 DIAGNOSIS — I350 Nonrheumatic aortic (valve) stenosis: Secondary | ICD-10-CM

## 2020-01-09 DIAGNOSIS — I428 Other cardiomyopathies: Secondary | ICD-10-CM

## 2020-01-09 DIAGNOSIS — I4819 Other persistent atrial fibrillation: Secondary | ICD-10-CM

## 2020-01-09 DIAGNOSIS — I2510 Atherosclerotic heart disease of native coronary artery without angina pectoris: Secondary | ICD-10-CM

## 2020-01-09 DIAGNOSIS — E7800 Pure hypercholesterolemia, unspecified: Secondary | ICD-10-CM

## 2020-01-09 LAB — LIPID PANEL
Chol/HDL Ratio: 3.8 ratio (ref 0.0–5.0)
Cholesterol, Total: 160 mg/dL (ref 100–199)
HDL: 42 mg/dL (ref >39)
LDL Chol Calc (NIH): 107 mg/dL — ABNORMAL HIGH (ref 0–99)
Triglycerides: 55 mg/dL (ref 0–149)
VLDL Cholesterol Cal: 11 mg/dL (ref 5–40)

## 2020-01-16 ENCOUNTER — Ambulatory Visit: Payer: Medicare Other | Admitting: Podiatry

## 2020-02-17 ENCOUNTER — Telehealth: Payer: Self-pay | Admitting: *Deleted

## 2020-02-17 NOTE — Telephone Encounter (Signed)
I spoke to pt and he states he bumped the toe aabout 2-3 weeks ago and it continues to ooze some everyday. I saw pt was on eliquis about the time he stated he was on eliquis. I told him that it could be the reason for some of the drainage and he should continue and begin epsom salt soaks and the antibiotic ointment Dr. Amalia Hailey had previously prescribed and I would have the schedulers call him Monday.

## 2020-02-17 NOTE — Telephone Encounter (Signed)
Pt states he had an ingrown toenail procedure 12/05/2019 and he bumped it on the coffee table and it is bleeding, what to do.

## 2020-02-21 ENCOUNTER — Telehealth: Payer: Self-pay

## 2020-02-21 ENCOUNTER — Telehealth: Payer: Self-pay | Admitting: Podiatry

## 2020-02-21 NOTE — Telephone Encounter (Signed)
Pt wanted to know if he should continue soaking and apply gentamicin until his appt on 8/30.

## 2020-02-21 NOTE — Telephone Encounter (Signed)
Left voicemail for patient to call the office and schedule appt for ingrown procedure follow up.

## 2020-02-22 NOTE — Telephone Encounter (Signed)
I spoke with pt and informed that he should soak and antibiotic dress the procedure sites for 2-3 weeks until the areas get a dry hard scab without redness, swelling or oozing to check this at the end of the 3rd week by soaking and leaving off the dressing.

## 2020-02-23 ENCOUNTER — Telehealth: Payer: Self-pay | Admitting: *Deleted

## 2020-02-23 MED ORDER — GENTAMICIN SULFATE 0.1 % EX CREA: 1.0000 "application " | TOPICAL_CREAM | Freq: Two times a day (BID) | CUTANEOUS | 1 refills | Status: DC

## 2020-02-23 NOTE — Telephone Encounter (Signed)
Pt states the gentamicin is not in the pharmacy.

## 2020-02-23 NOTE — Telephone Encounter (Signed)
I spoke with pt and informed that I did just send the gentamicin to his [facility] [phone], and apologized for missing it.

## 2020-03-12 ENCOUNTER — Other Ambulatory Visit: Payer: Self-pay

## 2020-03-12 ENCOUNTER — Other Ambulatory Visit (HOSPITAL_COMMUNITY): Payer: Medicare Other

## 2020-03-12 ENCOUNTER — Ambulatory Visit (HOSPITAL_COMMUNITY)
Admission: RE | Admit: 2020-03-12 | Discharge: 2020-03-12 | Disposition: A | Discharge status: Home / Self Care | Payer: Medicare Other | Source: Ambulatory Visit | Attending: Cardiovascular Disease | Admitting: Cardiovascular Disease

## 2020-03-12 DIAGNOSIS — I2510 Atherosclerotic heart disease of native coronary artery without angina pectoris: Secondary | ICD-10-CM | POA: Diagnosis not present

## 2020-03-12 DIAGNOSIS — E7800 Pure hypercholesterolemia, unspecified: Secondary | ICD-10-CM | POA: Diagnosis not present

## 2020-03-12 DIAGNOSIS — I350 Nonrheumatic aortic (valve) stenosis: Secondary | ICD-10-CM | POA: Diagnosis not present

## 2020-03-12 DIAGNOSIS — I428 Other cardiomyopathies: Secondary | ICD-10-CM | POA: Diagnosis not present

## 2020-03-12 DIAGNOSIS — I4819 Other persistent atrial fibrillation: Secondary | ICD-10-CM | POA: Diagnosis not present

## 2020-03-12 LAB — ECHOCARDIOGRAM COMPLETE
AR max vel: 0.76 cm2
AV Area VTI: 0.77 cm2
AV Area mean vel: 0.72 cm2
AV Mean grad: 28.5 mmHg
AV Peak grad: 50.6 mmHg
Ao pk vel: 3.56 m/s
Area-P 1/2: 3.83 cm2
P 1/2 time: 481 msec
S' Lateral: 2.6 cm

## 2020-03-12 NOTE — Progress Notes (Signed)
*  PRELIMINARY RESULTS* Echocardiogram 2D Echocardiogram has been performed.  Nicholas Sutton 03/12/2020, 9:23 AM

## 2020-03-16 ENCOUNTER — Encounter: Payer: Self-pay | Admitting: Cardiovascular Disease

## 2020-03-16 ENCOUNTER — Ambulatory Visit (INDEPENDENT_AMBULATORY_CARE_PROVIDER_SITE_OTHER): Payer: Medicare Other | Admitting: Cardiovascular Disease

## 2020-03-16 ENCOUNTER — Other Ambulatory Visit: Payer: Self-pay

## 2020-03-16 VITALS — BP 90/60 | HR 75 | Ht 72.0 in | Wt 161.0 lb

## 2020-03-16 DIAGNOSIS — I2510 Atherosclerotic heart disease of native coronary artery without angina pectoris: Secondary | ICD-10-CM

## 2020-03-16 DIAGNOSIS — I4819 Other persistent atrial fibrillation: Secondary | ICD-10-CM

## 2020-03-16 DIAGNOSIS — I350 Nonrheumatic aortic (valve) stenosis: Secondary | ICD-10-CM

## 2020-03-16 DIAGNOSIS — E7800 Pure hypercholesterolemia, unspecified: Secondary | ICD-10-CM | POA: Diagnosis not present

## 2020-03-16 DIAGNOSIS — I428 Other cardiomyopathies: Secondary | ICD-10-CM

## 2020-03-16 NOTE — Progress Notes (Signed)
Chief Complaint  Patient presents with  . Follow-up    AV disease   History of Present Illness: 74 yo male with history of non-ischemic cardiomyopathy, persistent atrial fibrillation, moderate aortic stenosis, mild aortic insufficiency and CAD who is here today for follow up. He has been followed in our office by Dr. Saunders Revel. I met him for the first time in July 2020. Echo in January 2018 with LVEF=35-40%. Cardiac cath March 2018 with mild CAD and normal LVEF. He has persistent atrial fibrillation and is on Eliquis and Coreg. He has moderately severe aortic stenosis. Echo August 2021 with LVEF=55-60%, moderately severe AS with mean gradient 28.5 mmHg, peak gradient 50.6 mmHg, AVA 0.77 cm2 and dimensionless index 0.24. mild to moderate AI.   He is here today for follow up. The patient denies any chest pain, dyspnea, palpitations, lower extremity edema, orthopnea, PND, dizziness, near syncope or syncope.   Primary Care Physician: Redmond School, MD  Past Medical History:  Diagnosis Date  . Anxiety   . Atrial fibrillation ([place])   . Diarrhea   . Diverticulitis   . Erectile dysfunction   . 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 Headache(784.0)    x 3 - 4 per year  . Heart murmur    has had no problems   . Nonischemic cardiomyopathy [facility])     Past Surgical History:  Procedure Laterality Date  . APPENDECTOMY    . COLON RESECTION    . COLOSTOMY REVISION N/A 07/06/2013   Procedure: COLON RESECTION SIGMOID;  Surgeon: Edward Jolly, MD;  Location: [facility];  Service: General;  Laterality: N/A;  . FISTULOTOMY N/A 07/06/2013   Procedure: repair of colovesicle fistula/ FISTULOTOMY;  Surgeon: Edward Jolly, MD;  Location: [facility];  Service: General;  Laterality: N/A;  . GUM SURGERY    . HERNIA REPAIR    . INGUINAL HERNIA REPAIR Left 10/10/2015   Procedure: LEFT INGUINAL HERNIA REPAIR ;  Surgeon: Donnie Mesa, MD;   Location: [facility];  Service: General;  Laterality: Left;  . INSERTION OF MESH Left 10/10/2015   Procedure: INSERTION OF MESH;  Surgeon: Donnie Mesa, MD;  Location: [facility];  Service: General;  Laterality: Left;  . RIGHT/LEFT HEART CATH AND CORONARY ANGIOGRAPHY N/A 10/09/2016   Procedure: Right/Left Heart Cath and Coronary Angiography;  Surgeon: Nelva Bush, MD;  Location: [facility];  Service: Cardiovascular;  Laterality: N/A;    Current Outpatient Medications  Medication Sig Dispense Refill  . acetaminophen (TYLENOL) 500 MG tablet Take 1,000 mg by mouth every 6 (six) hours as needed for headache.    [name] apixaban (ELIQUIS) 5 MG TABS tablet Take 1 tablet (5 mg total) by mouth 2 (two) times daily. 180 tablet 2  . Ascorbic Acid (VITAMIN C) 1000 MG tablet Take 1,000 mg by mouth 2 (two) times daily.     . carvedilol (COREG) 3.125 MG tablet Take 1 tablet (3.125 mg total) by mouth 2 (two) times daily. 180 tablet 3  . cetirizine (ZYRTEC) 10 MG tablet Take 10 mg by mouth at bedtime.     [name] gentamicin cream (GARAMYCIN) 0.1 % Apply 1 application topically 2 (two) times daily. 15 g 1  . losartan (COZAAR) 25 MG tablet TAKE 1 TABLET(25 MG) BY MOUTH DAILY 90 tablet 3  . Omega-3 Fatty Acids (FISH OIL) 1000 MG CPDR Take 1 capsule by mouth 2 (two) times daily.     [name]  tadalafil (CIALIS) 5 MG tablet Take 1 tablet by mouth as needed for erectile dysfunction.     No current facility-administered medications for this visit.    Allergies  Allergen Reactions  . Dust Mite Extract Other (See Comments)    Sneezing  . Pollen Extract Other (See Comments)    Sneezing    Social History   Socioeconomic History  . Marital status: Divorced    Spouse name: Not on file  . Number of children: Not on file  . Years of education: Not on file  . Highest education level: Not on file  Occupational History  . Not on file  Tobacco Use  . Smoking status: Former Smoker    Packs/day: 1.50    Years: 30.00    Pack  years: 45.00    Types: Cigarettes    Quit date: 06/03/2008    Years since quitting: 11.7  . Smokeless tobacco: Never Used  Vaping Use  . Vaping Use: Never used  Substance and Sexual Activity  . Alcohol use: No    Comment: rare  . Drug use: No  . Sexual activity: Never    Birth control/protection: None  Other Topics Concern  . Not on file  Social History Narrative  . Not on file   Social Determinants of Health   Financial Resource Strain:   . Difficulty of Paying Living Expenses: Not on file  Food Insecurity:   . Worried About Charity fundraiser in the Last Year: Not on file  . Ran Out of Food in the Last Year: Not on file  Transportation Needs:   . Lack of Transportation (Medical): Not on file  . Lack of Transportation (Non-Medical): Not on file  Physical Activity:   . Days of Exercise per Week: Not on file  . Minutes of Exercise per Session: Not on file  Stress:   . Feeling of Stress : Not on file  Social Connections:   . Frequency of Communication with Friends and Family: Not on file  . Frequency of Social Gatherings with Friends and Family: Not on file  . Attends Religious Services: Not on file  . Active Member of Clubs or Organizations: Not on file  . Attends Archivist Meetings: Not on file  . Marital Status: Not on file  Intimate Partner Violence:   . Fear of Current or Ex-Partner: Not on file  . Emotionally Abused: Not on file  . Physically Abused: Not on file  . Sexually Abused: Not on file    Family History  Problem Relation Age of Onset  . Stroke Mother 52  . Heart attack Father 41  . Hypertension Unknown     Review of Systems:  As stated in the HPI and otherwise negative.   BP 90/60   Pulse 75   Ht 6' (1.829 m)   Wt 161 lb (73 kg)   SpO2 97%   BMI 21.84 kg/m   Physical Examination:  General: Well developed, well nourished, NAD  HEENT: OP clear, mucus membranes moist  SKIN: warm, dry. No rashes. Neuro: No focal deficits   Musculoskeletal: Muscle strength 5/5 all ext  Psychiatric: Mood and affect normal  Neck: No JVD, no carotid bruits, no thyromegaly, no lymphadenopathy.  Lungs:Clear bilaterally, no wheezes, rhonci, crackles Cardiovascular: Irreg irreg. Loud systolic murmur.  Abdomen:Soft. Bowel sounds present. Non-tender.  Extremities: No lower extremity edema. Pulses are 2 + in the bilateral DP/PT.  EKG:  EKG is  ordered today. The ekg ordered  today demonstrates atrial fib, rate 75 bpm  Echo August 2021: 1. Left ventricular ejection fraction, by estimation, is 55 to 60%. The  left ventricle has normal function. The left ventricle has no regional  wall motion abnormalities. Left ventricular diastolic parameters are  indeterminate.  2. Right ventricular systolic function is normal. The right ventricular  size is normal. There is normal pulmonary artery systolic pressure.  3. Left atrial size was severely dilated.  4. Right atrial size was severely dilated.  5. The mitral valve is normal in structure. Trivial mitral valve  regurgitation. No evidence of mitral stenosis.  6. The aortic valve has an indeterminant number of cusps. Aortic valve  regurgitation is mild to moderate. Moderate to severe aortic valve  stenosis.Severe aortic valve annular calcification. There is severe  thickening of the aortic valve. There is severe  calcifcation of the aortic valve. Aortic valve mean gradient measures  28.5 mmHg. Aortic valve peak gradient measures 50.6 mmHg. Aortic valve  area, by VTI measures 0.77 cm.  7. The inferior vena cava is normal in size with greater than 50%  respiratory variability, suggesting right atrial pressure of 3 mmHg.   Cardiac catheterization 10/10/2016 LM normal LAD mid 10; D1 ostial 20 LCx okay RCA normal Findings: 1. Mild, non-obstructive coronary artery disease. 2. Normal left and right heart filling pressures. 3. Low normal Fick cardiac output/index. 4. Moderate  aortic stenosis. 5. Mild aortic regurgitation. 6. Mildly reduced left ventricular contraction (LVEF 45-50%).  Recent Labs: No results found for requested labs within last 8760 hours.   Lipid Panel    Component Value Date/Time   CHOL 160 01/09/2020 0816   TRIG 55 01/09/2020 0816   HDL 42 01/09/2020 0816   CHOLHDL 3.8 01/09/2020 0816   LDLCALC 107 (H) 01/09/2020 0816     Wt Readings from Last 3 Encounters:  03/16/20 161 lb (73 kg)  09/23/19 163 lb 12.8 oz (74.3 kg)  06/08/19 175 lb (79.4 kg)     Other studies Reviewed: Additional studies/ records that were reviewed today include:  Review of the above records demonstrates:    Assessment and Plan:   1. Atrial fibrillation, persistent: He is in atrial fib today. Rate is controlled. Will continue Eliquis and Coreg.    2. Non-ischemic cardiomyopathy: LV function normal by echo August 2021  3. Aortic stenosis:  Severe by echo August 2021. He remains  asymptomatic. I will repeat an echo in 6 months and see him after that. He will call with change in his symptoms.   4. Hyperlipidemia: LDL 107. He refuses statin therapy. We will discuss Repatha at follow up visit.    5. CAD without angina: Mild CAD by cath in 2018. No ASA given use of Eliquis. Will continue statin and beta blocker.   Current medicines are reviewed at length with the patient today.  The patient does not have concerns regarding medicines.  The following changes have been made:  no change  Labs/ tests ordered today include:   No orders of the defined types were placed in this encounter.  I spent 45 minutes today answering multiple questions.   Disposition:   FU with me in 6 months   Signed, Lauree Chandler, MD 03/16/2020 8:54 AM    [facility] [address], [place], [place]  [address] Phone: [phone]; Fax: [phone]

## 2020-03-16 NOTE — Patient Instructions (Signed)
Medication Instructions:  NO CHANGES *If you need a refill on your cardiac medications before your next appointment, please call your pharmacy*   Lab Work: NONE  Testing/Procedures:  DUE JAN 2022 Your physician has requested that you have an echocardiogram. Echocardiography is a painless test that uses sound waves to create images of your heart. It provides your doctor with information about the size and shape of your heart and how well your heart's chambers and valves are working. This procedure takes approximately one hour. There are no restrictions for this procedure.   Follow-Up: At [facility], you and your health needs are our priority.  As part of our continuing mission to provide you with exceptional heart care, we have created designated Provider Care Teams.  These Care Teams include your primary Cardiologist (physician) and Advanced Practice Providers (APPs -  Physician Assistants and Nurse Practitioners) who all work together to provide you with the care you need, when you need it.   Your next appointment:   6 MONTHS The format for your next appointment:   In Person  Provider:   Lauree Chandler, MD   Other Instructions

## 2020-03-19 ENCOUNTER — Ambulatory Visit (INDEPENDENT_AMBULATORY_CARE_PROVIDER_SITE_OTHER): Payer: Medicare Other | Admitting: Podiatry

## 2020-03-19 ENCOUNTER — Other Ambulatory Visit: Payer: Self-pay

## 2020-03-19 ENCOUNTER — Ambulatory Visit: Payer: Medicare Other | Admitting: Podiatry

## 2020-03-19 DIAGNOSIS — L929 Granulomatous disorder of the skin and subcutaneous tissue, unspecified: Secondary | ICD-10-CM | POA: Diagnosis not present

## 2020-03-19 DIAGNOSIS — R52 Pain, unspecified: Secondary | ICD-10-CM

## 2020-03-19 DIAGNOSIS — L600 Ingrowing nail: Secondary | ICD-10-CM

## 2020-03-19 NOTE — Patient Instructions (Signed)
Leave open to air when at home, if in closed shoes out of the house, cover with a dry bandaid

## 2020-03-20 NOTE — Progress Notes (Signed)
  Subjective:  Patient ID: Nicholas Sutton, male    DOB: 1946-06-08,  MRN: [id number]  Chief Complaint  Patient presents with  . Nail Problem    pain redness and swelling in R great toe    74 y.o. male presents with the above complaint. History confirmed with patient.  He had bilateral hallux partial nail avulsion matricectomies in 12/26/2019.  The left side feels like it is healed up well, the right side is a persistent redness, pain, swelling and bleeds occasionally.  He is on Eliquis.  Objective:  Physical Exam: warm, good capillary refill, no trophic changes or ulcerative lesions, normal DP and PT pulses and normal sensory exam. Left Foot: Matricectomy site is well-healed without recurrent ingrown Right Foot: Large granuloma in the proximal portion of the matricectomy site, it appears the portion of the nail has ingrown again.  No signs of infection.  Assessment:   1. Granuloma of great toe   2. Ingrowing right great toenail   3. Pain      Plan:  Patient was evaluated and treated and all questions answered.   Discussed treatment options in detail with the patient.  I recommend that we cauterized the granuloma chemically today.  This was done with silver nitrate applications x2, he tolerated the procedure well.  If this becomes painful in the future we can anesthetized the hallux.  I also discussed that if his ingrown nail is recurring we may need to repeat the avulsion matricectomy with more the nail removed.  Return in about 1 week (around 03/26/2020) for wound re-check with me.

## 2020-03-21 ENCOUNTER — Telehealth: Payer: Self-pay | Admitting: Podiatry

## 2020-03-21 NOTE — Telephone Encounter (Signed)
Patient called needing clarification on postop nail orders. Patient call back number is [phone].

## 2020-03-27 ENCOUNTER — Other Ambulatory Visit: Payer: Self-pay

## 2020-03-27 MED ORDER — CARVEDILOL 3.125 MG PO TABS: 3.1250 mg | ORAL_TABLET | Freq: Two times a day (BID) | ORAL | 3 refills | Status: DC

## 2020-03-29 ENCOUNTER — Ambulatory Visit (INDEPENDENT_AMBULATORY_CARE_PROVIDER_SITE_OTHER): Payer: Medicare Other | Admitting: Podiatry

## 2020-03-29 ENCOUNTER — Other Ambulatory Visit: Payer: Self-pay

## 2020-03-29 DIAGNOSIS — L600 Ingrowing nail: Secondary | ICD-10-CM

## 2020-03-29 DIAGNOSIS — M79675 Pain in left toe(s): Secondary | ICD-10-CM | POA: Diagnosis not present

## 2020-03-29 DIAGNOSIS — L929 Granulomatous disorder of the skin and subcutaneous tissue, unspecified: Secondary | ICD-10-CM | POA: Diagnosis not present

## 2020-03-29 NOTE — Progress Notes (Signed)
    Subjective:  Patient ID: Nicholas Sutton, male    DOB: September 16, 1945,  MRN: [id number]  Chief Complaint  Patient presents with  . Wound Check    Right hallux nail PT stated that he has a achy pain and it hurts to wear shoes and walk     74 y.o. male returns with the above complaint. History confirmed with patient.  Right side is improving and has decreased in size since application of silver nitrate.  The left side is still painful.  Objective:  Physical Exam: warm, good capillary refill, no trophic changes or ulcerative lesions, normal DP and PT pulses and normal sensory exam. Left Foot: Matricectomy site is well-healed, he still does have pain with palpation to the medial hallux along the nail border, possible that he has a recurrent ingrown proximally. Right Foot: Granuloma has reduced approximately 50% in size, improved significantly since last visit and less painful.  Assessment:   1. Granuloma of great toe   2. Ingrowing right great toenail   3. Pain in toe of left foot      Plan:  Patient was evaluated and treated and all questions answered.   Discussed treatment options in detail with the patient.  Repeat cauterization was done today.  This was done with silver nitrate applications x2, he tolerated the procedure well.  Did not require anesthesia.  I also discussed that if his ingrown nail is recurring we may need to repeat the avulsion matricectomy with more nail removed on both calluses  Return in about 1 week (around 04/05/2020) for wound re-check.

## 2020-04-05 ENCOUNTER — Other Ambulatory Visit: Payer: Self-pay

## 2020-04-05 ENCOUNTER — Ambulatory Visit (INDEPENDENT_AMBULATORY_CARE_PROVIDER_SITE_OTHER): Payer: Medicare Other | Admitting: Podiatry

## 2020-04-05 DIAGNOSIS — L929 Granulomatous disorder of the skin and subcutaneous tissue, unspecified: Secondary | ICD-10-CM

## 2020-04-05 DIAGNOSIS — L600 Ingrowing nail: Secondary | ICD-10-CM

## 2020-04-05 NOTE — Progress Notes (Signed)
    Subjective:  Patient ID: Nicholas Sutton, male    DOB: 27-May-1946,  MRN: [id number]  Chief Complaint  Patient presents with  . Foot Ulcer    granuloma of the left great toe, 1 week follow up    74 y.o. male returns with the above complaint. History confirmed with patient.  Improved significantly.  No drainage from the right nail.  Objective:  Physical Exam: warm, good capillary refill, no trophic changes or ulcerative lesions, normal DP and PT pulses and normal sensory exam. Left Foot: Minimal pain today Right Foot: Granuloma no longer prominent.  There is a black scab from the silver nitrate application  Assessment:   1. Granuloma of great toe   2. Ingrowing right great toenail      Plan:  Patient was evaluated and treated and all questions answered.   No application of silver nitrate done today.  Hopefully the scab will fall off on its own.  If it increases in size before the next visit he should call and come in for a further treatment.  Unclear if nail will regrow over granuloma or if he will need repeat avulsion.  Return in about 7 weeks (around 05/24/2020).

## 2020-05-03 NOTE — Telephone Encounter (Signed)
Error

## 2020-05-31 ENCOUNTER — Other Ambulatory Visit: Payer: Self-pay

## 2020-05-31 ENCOUNTER — Ambulatory Visit (INDEPENDENT_AMBULATORY_CARE_PROVIDER_SITE_OTHER): Payer: Medicare Other | Admitting: Podiatry

## 2020-05-31 DIAGNOSIS — L600 Ingrowing nail: Secondary | ICD-10-CM

## 2020-05-31 DIAGNOSIS — L929 Granulomatous disorder of the skin and subcutaneous tissue, unspecified: Secondary | ICD-10-CM | POA: Diagnosis not present

## 2020-06-03 ENCOUNTER — Encounter: Payer: Self-pay | Admitting: Podiatry

## 2020-06-03 NOTE — Progress Notes (Signed)
    Subjective:  Patient ID: Nicholas Sutton, male    DOB: 1945/12/20,  MRN: [id number]  Chief Complaint  Patient presents with  . Nail Problem    PT stated that he is doing okay but his toes are still sore     74 y.o. male returns with the above complaint. History confirmed with patient. Still mildly painful but improving  Objective:  Physical Exam: warm, good capillary refill, no trophic changes or ulcerative lesions, normal DP and PT pulses and normal sensory exam. Left Foot: Minimal pain today Right Foot: Granuloma no longer prominent.  There is a black scab from the silver nitrate application  Assessment:   No diagnosis found.   Plan:  Patient was evaluated and treated and all questions answered.  We discussed that this should continue to improve and let the nail grow out. I think it would be good to see Dr Amalia Hailey to see if he thinks further avulsion would be necessary.   Return in about 3 months (around 08/31/2020).

## 2020-06-04 ENCOUNTER — Ambulatory Visit: Payer: Medicare Other | Admitting: Cardiovascular Disease

## 2020-06-22 ENCOUNTER — Other Ambulatory Visit: Payer: Self-pay | Admitting: Cardiovascular Disease

## 2020-06-22 NOTE — Telephone Encounter (Signed)
Prescription refill request for Eliquis received.  Last office visit: [place], 03/16/2020 Scr: 1.03, 07/01/2019, via [facility] Age: 74 yo  Weight: 73 kg  Pt sees [place] in February, put on appointment note for pt to have CBC and BMET for Eliquis follow. Prescription refill sent.

## 2020-07-09 DIAGNOSIS — Z1331 Encounter for screening for depression: Secondary | ICD-10-CM | POA: Diagnosis not present

## 2020-07-09 DIAGNOSIS — Z6821 Body mass index (BMI) 21.0-21.9, adult: Secondary | ICD-10-CM | POA: Diagnosis not present

## 2020-07-09 DIAGNOSIS — M779 Enthesopathy, unspecified: Secondary | ICD-10-CM | POA: Diagnosis not present

## 2020-07-09 DIAGNOSIS — K5732 Diverticulitis of large intestine without perforation or abscess without bleeding: Secondary | ICD-10-CM | POA: Diagnosis not present

## 2020-07-09 DIAGNOSIS — Z0001 Encounter for general adult medical examination with abnormal findings: Secondary | ICD-10-CM | POA: Diagnosis not present

## 2020-07-09 DIAGNOSIS — I4891 Unspecified atrial fibrillation: Secondary | ICD-10-CM | POA: Diagnosis not present

## 2020-07-09 DIAGNOSIS — N529 Male erectile dysfunction, unspecified: Secondary | ICD-10-CM | POA: Diagnosis not present

## 2020-07-10 DIAGNOSIS — Z6821 Body mass index (BMI) 21.0-21.9, adult: Secondary | ICD-10-CM | POA: Diagnosis not present

## 2020-07-10 DIAGNOSIS — Z0000 Encounter for general adult medical examination without abnormal findings: Secondary | ICD-10-CM | POA: Diagnosis not present

## 2020-07-10 DIAGNOSIS — Z1389 Encounter for screening for other disorder: Secondary | ICD-10-CM | POA: Diagnosis not present

## 2020-07-26 ENCOUNTER — Ambulatory Visit: Payer: Self-pay | Admitting: Surgery

## 2020-07-26 DIAGNOSIS — K4091 Unilateral inguinal hernia, without obstruction or gangrene, recurrent: Secondary | ICD-10-CM | POA: Diagnosis not present

## 2020-07-26 NOTE — H&P (Signed)
History of Present Illness Nicholas Sutton. Nicholas Hershman MD; 07/26/2020 4:01 PM) The patient is a 75 year old male who presents with an inguinal hernia. Status post left inguinal hernia repair with mesh on 10/10/15 for a indirect hernia. This was repaired with Progrip mesh. The patient has been doing well, but about 2 months ago, he began to notice more swelling below the incision. The bulge that he notices get smaller when he is supine. He denies any significant pain in this area. He denies any change in urination or bowel movements. He was examined by his primary care physician who thought that he might have a recurrent hernia. He does not remember any exacerbating event. He presents now to discuss possible recurrent hernia.   Problem List/Past Medical Nicholas Key 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 Open Inguinal Hernia Surgery Left, Right. Oral Surgery  Diagnostic Studies History [name], [place]; 07/26/2020 3:15 PM) Colonoscopy 5-10 years ago 1-5 years ago  Allergies [name], CMA; 07/26/2020 3:16 PM) Dust Mite Extract *BIOLOGICALS MISC* Pollen Extracts *ALTERNATIVE MEDICINES* Allergies Reconciled  Medication History [name], CMA; 07/26/2020 3:19 PM) Carvedilol (3.125MG  Tablet, Oral) Active. Eliquis (5MG  Tablet, Oral) Active. Losartan Potassium (25MG  Tablet, Oral) Active. Tadalafil (5MG  Tablet, Oral) Active. Tylenol (325MG  Tablet, 1 (one) Oral) Active. Tylenol (500MG  Capsule, Oral) Active. Vitamin C (1000MG  Tablet Chewable, 1 (one) Oral) Active. Fish Oil Concentrate (435MG  Capsule, 1 (one) Oral) Active. Allegra (Oral) Specific strength unknown - Active. Cholecalciferol (25 MCG(1000 UT) Tablet Chewable, Oral) Active. PreserVision AREDS (Oral)  Active. Medications Reconciled  Social History [name], [place]; 07/26/2020 3:15 PM) Alcohol use Moderate alcohol use, Occasional alcohol use. Caffeine use Coffee, Tea. No drug use Tobacco use Former smoker.  Family History Nicholas Sutton. Nicholas Cajamarca, MD; 07/26/2020 4:01 PM) Heart Disease Father. Hypertension Mother. Arthritis Brother, Mother. Cerebrovascular Accident Mother. Migraine Headache Sister.  Other Problems Nicholas Sutton. Nicholas Coppens, MD; 07/26/2020 4:01 PM) Atrial Fibrillation Diverticulosis Heart murmur Hemorrhoids Hypercholesterolemia Inguinal Hernia Gastroesophageal Reflux Disease     Review of Systems [name] CMA; 07/26/2020 3:15 PM) General Not Present- Appetite Loss, Chills, Fatigue, Fever, Night Sweats, Weight Gain and Weight Loss. Skin Not Present- Change in Wart/Mole, Dryness, Hives, Jaundice, New Lesions, Non-Healing Wounds, Rash and Ulcer. HEENT Present- Seasonal Allergies and Wears glasses/contact lenses. Not Present- Earache, Hearing Loss, Hoarseness, Nose Bleed, Oral Ulcers, Ringing in the Ears, Sinus Pain, Sore Throat, Visual Disturbances and Yellow Eyes. Respiratory Not Present- Bloody sputum, Chronic Cough, Difficulty Breathing, Snoring and Wheezing. Breast Not Present- Breast Mass, Breast Pain, Nipple Discharge and Skin Changes. Cardiovascular Not Present- Chest Pain, Difficulty Breathing Lying Down, Leg Cramps, Palpitations, Rapid Heart Rate, Shortness of Breath and Swelling of Extremities. Male Genitourinary Present- Frequency and Nocturia. Not Present- Blood in Urine, Change in Urinary Stream, Impotence, Painful Urination, Urgency and Urine Leakage. Musculoskeletal Not Present- Back Pain, Joint Pain, Joint Stiffness, Muscle Pain, Muscle Weakness and Swelling of Extremities. Neurological Present- Headaches. Not Present- Decreased Memory, Fainting, Numbness, Seizures, Tingling, Tremor, Trouble walking and Weakness. Psychiatric Not Present-  Anxiety, Bipolar, Change in Sleep Pattern, Depression, Fearful and Frequent crying. Endocrine Not Present- Cold Intolerance, Excessive Hunger, Hair Changes, Heat Intolerance, Hot flashes and New Diabetes. Hematology Present- Blood Thinners. Not Present- Easy Bruising, Excessive bleeding, Gland problems, HIV and Persistent Infections.  Vitals [name] CMA; 07/26/2020 3:19 PM) 07/26/2020 3:19 PM  Weight: 160.5 lb Height: 72in Body Surface Area: 1.94 m Body Mass Index: 21.77 kg/m  Temp.: 97.78F  Pulse: 98 (Regular)  P.OX: 97% (Room air) BP: 112/80(Sitting, Left Arm, Standard)        Physical Exam Nicholas Hazard K. Aarna Mihalko MD; 07/26/2020 4:03 PM)  The physical exam findings are as follows: Note:Constitutional: WDWN in NAD, conversant, no obvious deformities; resting comfortably Eyes: Pupils equal, round; sclera anicteric; moist conjunctiva; no lid lag HENT: Oral mucosa moist; good dentition Neck: No masses palpated, trachea midline; no thyromegaly Lungs: CTA bilaterally; normal respiratory effort CV: Regular rate and rhythm; no murmurs; extremities well-perfused with no edema Abd: +bowel sounds, soft, non-tender, no palpable organomegaly; no palpable hernias GU: bilateral descended testes; no testicular masses; healed left inguinal incision with palpable recurrent hernia; easily reducible; no sign of right inguinal hernia Musc: Normal gait; no apparent clubbing or cyanosis in extremities Lymphatic: No palpable cervical or axillary lymphadenopathy Skin: Warm, dry; no sign of jaundice Psychiatric - alert and oriented x 4; calm mood and affect    Assessment & Plan Nicholas Hazard K. 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 The surgical procedure has been discussed with the patient. Potential risks, benefits, alternative treatments, and expected outcomes have  been explained. All of the patient's questions at this time have been answered. The likelihood of reaching the patient's treatment goal is good. The patient understand the proposed surgical procedure and wishes to proceed.  Wilmon Arms. Corliss Skains, MD, [facility]  General/ Trauma Surgery   07/26/2020 4:07 PM

## 2020-07-27 ENCOUNTER — Telehealth: Payer: Self-pay | Admitting: *Deleted

## 2020-07-27 NOTE — Telephone Encounter (Signed)
   Primary Cardiologist: Lauree Chandler, MD  Chart reviewed as part of pre-operative protocol coverage.  Attempted to call the patient to determine the urgency of his procedure. If non-urgent, favor proceeding with echo scheduled for 08/16/20 and follow-up with Dr. Angelena Form 08/23/20 as planned. If more urgent, can attempt to move echo appointment up.   Will await patient call back for further clarification.  Abigail Butts, PA-C 07/27/2020, 3:37 PM

## 2020-07-27 NOTE — Telephone Encounter (Signed)
   [facility] Pre-operative Risk Assessment    HEARTCARE STAFF: - Please ensure there is not already an duplicate clearance open for this procedure. - Under Visit Info/Reason for Call, type in Other and utilize the format Clearance MM/DD/YY or Clearance TBD. Do not use dashes or single digits. - If request is for dental extraction, please clarify the # of teeth to be extracted.  Request for surgical clearance:  1. What type of surgery is being performed? OPEN REPAIR OF RECURRENT LEFT INGUINAL HERNIA   2. When is this surgery scheduled? TBD   3. What type of clearance is required (medical clearance vs. Pharmacy clearance to hold med vs. Both)? BOTH  4. Are there any medications that need to be held prior to surgery and how long? ELIQUIS   5. Practice name and name of physician performing surgery? [facility]; DR. [name] TSUEI   6. What is the office phone number? [phone]    7.   What is the office fax number? [place]: Mammie Lorenzo, LPN  8.   Anesthesia type (None, local, MAC, general) ? LMA   Julaine Hua 07/27/2020, 10:00 AM  _________________________________________________________________   (provider comments below)

## 2020-07-27 NOTE — Telephone Encounter (Signed)
   Primary Cardiologist: Lauree Chandler, MD  Chart reviewed as part of pre-operative protocol coverage.   Patient has a history of severe aortic stenosis with plans for a 6 month follow-up echocardiogram 08/16/20 with an appointment with Dr. Angelena Form 08/23/20.   Dr. Angelena Form - can you comment on whether he should have his follow-up echo prior to surgery? Please route your response back to P CV DIV PREOP. Thank you!   Abigail Butts, PA-C 07/27/2020, 2:36 PM

## 2020-07-27 NOTE — Telephone Encounter (Signed)
We should repeat his echo before his hernia repair. Thanks, chris

## 2020-07-27 NOTE — Telephone Encounter (Signed)
Patient with diagnosis of afib on Eliquis for anticoagulation.    Procedure: OPEN REPAIR OF RECURRENT LEFT INGUINAL HERNIA  Date of procedure: TBD    CHA2DS2-VASc Score = 3  This indicates a 3.2% annual risk of stroke. The patient's score is based upon: CHF History: Yes HTN History: No Diabetes History: No Stroke History: No Vascular Disease History: Yes Age Score: 1 Gender Score: 0     CrCl 71 ml/min  Per office protocol, patient can hold Eliquis for 2 days prior to procedure.

## 2020-07-30 NOTE — Telephone Encounter (Signed)
Pt returned call. There is no urgency for his hernia surgery. He will proceed with echo and appt with Dr. Angelena Form as scheduled. Dr. Angelena Form will complete preop clearance on 08/23/20. Preop APP can be available to help with faxing clearance letter after his appointment.

## 2020-08-16 ENCOUNTER — Ambulatory Visit (HOSPITAL_COMMUNITY)
Admission: RE | Admit: 2020-08-16 | Discharge: 2020-08-16 | Disposition: A | Discharge status: Home / Self Care | Payer: Medicare Other | Source: Ambulatory Visit | Attending: Cardiovascular Disease | Admitting: Cardiovascular Disease

## 2020-08-16 ENCOUNTER — Other Ambulatory Visit: Payer: Self-pay

## 2020-08-16 DIAGNOSIS — I350 Nonrheumatic aortic (valve) stenosis: Secondary | ICD-10-CM

## 2020-08-16 DIAGNOSIS — I351 Nonrheumatic aortic (valve) insufficiency: Secondary | ICD-10-CM | POA: Diagnosis not present

## 2020-08-16 LAB — ECHOCARDIOGRAM COMPLETE
AR max vel: 0.72 cm2
AV Area VTI: 0.8 cm2
AV Area mean vel: 0.7 cm2
AV Mean grad: 28.7 mmHg
AV Peak grad: 49.5 mmHg
Ao pk vel: 3.52 m/s
Area-P 1/2: 3.91 cm2
P 1/2 time: 695 msec
S' Lateral: 2.7 cm

## 2020-08-16 NOTE — Progress Notes (Signed)
*  PRELIMINARY RESULTS* Echocardiogram 2D Echocardiogram has been performed.  Nicholas Sutton 08/16/2020, 11:20 AM

## 2020-08-23 ENCOUNTER — Ambulatory Visit (INDEPENDENT_AMBULATORY_CARE_PROVIDER_SITE_OTHER): Payer: Medicare Other | Admitting: Cardiovascular Disease

## 2020-08-23 ENCOUNTER — Other Ambulatory Visit: Payer: Self-pay

## 2020-08-23 ENCOUNTER — Encounter: Payer: Self-pay | Admitting: Cardiovascular Disease

## 2020-08-23 VITALS — BP 100/60 | HR 87 | Ht 72.0 in | Wt 164.6 lb

## 2020-08-23 DIAGNOSIS — I2510 Atherosclerotic heart disease of native coronary artery without angina pectoris: Secondary | ICD-10-CM | POA: Diagnosis not present

## 2020-08-23 DIAGNOSIS — I428 Other cardiomyopathies: Secondary | ICD-10-CM | POA: Diagnosis not present

## 2020-08-23 DIAGNOSIS — I350 Nonrheumatic aortic (valve) stenosis: Secondary | ICD-10-CM

## 2020-08-23 DIAGNOSIS — E7800 Pure hypercholesterolemia, unspecified: Secondary | ICD-10-CM | POA: Diagnosis not present

## 2020-08-23 DIAGNOSIS — I4819 Other persistent atrial fibrillation: Secondary | ICD-10-CM | POA: Diagnosis not present

## 2020-08-23 LAB — CBC
Hematocrit: 41.4 % (ref 37.5–51.0)
Hemoglobin: 13.8 g/dL (ref 13.0–17.7)
MCH: 30 pg (ref 26.6–33.0)
MCHC: 33.3 g/dL (ref 31.5–35.7)
MCV: 90 fL (ref 79–97)
Platelets: 151 10*3/uL (ref 150–450)
RBC: 4.6 x10E6/uL (ref 4.14–5.80)
RDW: 13.5 % (ref 11.6–15.4)
WBC: 6.5 10*3/uL (ref 3.4–10.8)

## 2020-08-23 LAB — BASIC METABOLIC PANEL
BUN/Creatinine Ratio: 19 (ref 10–24)
BUN: 17 mg/dL (ref 8–27)
CO2: 28 mmol/L (ref 20–29)
Calcium: 9.3 mg/dL (ref 8.6–10.2)
Chloride: 99 mmol/L (ref 96–106)
Creatinine, Ser: 0.88 mg/dL (ref 0.76–1.27)
GFR calc Af Amer: 99 mL/min/{1.73_m2} (ref >59)
GFR calc non Af Amer: 86 mL/min/{1.73_m2} (ref >59)
Glucose: 82 mg/dL (ref 65–99)
Potassium: 4.4 mmol/L (ref 3.5–5.2)
Sodium: 137 mmol/L (ref 134–144)

## 2020-08-23 NOTE — Progress Notes (Signed)
Chief Complaint  Patient presents with  . Follow-up    Aortic stenosis    History of Present Illness: 75 yo male with history of non-ischemic cardiomyopathy, persistent atrial fibrillation, moderate aortic stenosis, mild aortic insufficiency and CAD who is here today for follow up. He has been followed in our office by Dr. Saunders Revel. I met him for the first time in July 2020. Echo in January 2018 with LVEF=35-40%. Cardiac cath March 2018 with mild CAD and normal LVEF. He has persistent atrial fibrillation and is on Eliquis and Coreg. He has moderately severe aortic stenosis. Echo August 2021 with LVEF=55-60%, moderately severe AS with mean gradient 28.5 mmHg, peak gradient 50.6 mmHg, AVA 0.77 cm2 and dimensionless index 0.24. mild to moderate AI. I saw him in the office in August 2021 and he was asymptomatic. Echo 08/16/20 with LVEF=60-65%. Mild to moderate MR. Moderately severe aortic stenosis with mean gradient 28.16mmHg, peak gradient 49.5 mmHg, AVA 0.7 cm2, dimensionless index 0.26. No change in LV dimensions.   He is here today for follow up. The patient denies any chest pain, dyspnea, palpitations, lower extremity edema, orthopnea, PND, dizziness, near syncope or syncope. He can walk 30 minutes on the treadmill with little dyspnea. He may need to have an elective hernia repair with Dr. Georgette Dover.   Primary Care Physician: Redmond School, MD  Past Medical History:  Diagnosis Date  . Anxiety   . Atrial fibrillation ([place])   . Diarrhea   . Diverticulitis   . Erectile dysfunction   . 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 Headache(784.0)    x 3 - 4 per year  . Heart murmur    has had no problems   . Nonischemic cardiomyopathy [facility])     Past Surgical History:  Procedure Laterality Date  . APPENDECTOMY    . COLON RESECTION    . COLOSTOMY REVISION N/A 07/06/2013   Procedure: COLON RESECTION SIGMOID;  Surgeon: Edward Jolly, MD;  Location: [facility];  Service: General;  Laterality: N/A;  . FISTULOTOMY N/A 07/06/2013   Procedure: repair of colovesicle fistula/ FISTULOTOMY;  Surgeon: Edward Jolly, MD;  Location: [facility];  Service: General;  Laterality: N/A;  . GUM SURGERY    . HERNIA REPAIR    . INGUINAL HERNIA REPAIR Left 10/10/2015   Procedure: LEFT INGUINAL HERNIA REPAIR ;  Surgeon: Donnie Mesa, MD;  Location: [facility];  Service: General;  Laterality: Left;  . INSERTION OF MESH Left 10/10/2015   Procedure: INSERTION OF MESH;  Surgeon: Donnie Mesa, MD;  Location: [facility];  Service: General;  Laterality: Left;  . RIGHT/LEFT HEART CATH AND CORONARY ANGIOGRAPHY N/A 10/09/2016   Procedure: Right/Left Heart Cath and Coronary Angiography;  Surgeon: Nelva Bush, MD;  Location: [facility];  Service: Cardiovascular;  Laterality: N/A;    Current Outpatient Medications  Medication Sig Dispense Refill  . acetaminophen (TYLENOL) 500 MG tablet Take 1,000 mg by mouth every 6 (six) hours as needed for headache.    . Ascorbic Acid (VITAMIN C) 1000 MG tablet Take 1,000 mg by mouth 2 (two) times daily.     . carvedilol (COREG) 3.125 MG tablet Take 1 tablet (3.125 mg total) by mouth 2 (two) times daily. 180 tablet 3  . cetirizine (ZYRTEC) 10 MG tablet Take 10 mg by mouth at bedtime.    [name] ELIQUIS 5 MG TABS tablet TAKE 1 TABLET(5 MG) BY MOUTH TWICE DAILY  180 tablet 0  . losartan (COZAAR) 25 MG tablet TAKE 1 TABLET(25 MG) BY MOUTH DAILY 90 tablet 3  . Omega-3 Fatty Acids (FISH OIL) 1000 MG CPDR Take 1 capsule by mouth 2 (two) times daily.     . tadalafil (CIALIS) 5 MG tablet Take 1 tablet by mouth as needed for erectile dysfunction.    [name] VITAMIN D PO Take 500 mg by mouth daily.     No current facility-administered medications for this visit.    Allergies  Allergen Reactions  . Dust Mite Extract Other (See Comments)    Sneezing  . Pollen Extract Other (See Comments)    Sneezing    Social History    Socioeconomic History  . Marital status: Divorced    Spouse name: Not on file  . Number of children: Not on file  . Years of education: Not on file  . Highest education level: Not on file  Occupational History  . Not on file  Tobacco Use  . Smoking status: Former Smoker    Packs/day: 1.50    Years: 30.00    Pack years: 45.00    Types: Cigarettes    Quit date: 06/03/2008    Years since quitting: 12.2  . Smokeless tobacco: Never Used  Vaping Use  . Vaping Use: Never used  Substance and Sexual Activity  . Alcohol use: No    Comment: rare  . Drug use: No  . Sexual activity: Never    Birth control/protection: None  Other Topics Concern  . Not on file  Social History Narrative  . Not on file   Social Determinants of Health   Financial Resource Strain: Not on file  Food Insecurity: Not on file  Transportation Needs: Not on file  Physical Activity: Not on file  Stress: Not on file  Social Connections: Not on file  Intimate Partner Violence: Not on file    Family History  Problem Relation Age of Onset  . Stroke Mother 51  . Heart attack Father 62  . Hypertension Unknown     Review of Systems:  As stated in the HPI and otherwise negative.   BP 100/60   Pulse 87   Ht 6' (1.829 m)   Wt 164 lb 9.6 oz (74.7 kg)   SpO2 97%   BMI 22.32 kg/m   Physical Examination:  General: Well developed, well nourished, NAD  HEENT: OP clear, mucus membranes moist  SKIN: warm, dry. No rashes. Neuro: No focal deficits  Musculoskeletal: Muscle strength 5/5 all ext  Psychiatric: Mood and affect normal  Neck: No JVD, no carotid bruits, no thyromegaly, no lymphadenopathy.  Lungs:Clear bilaterally, no wheezes, rhonci, crackles Cardiovascular: Regular rate and rhythm. No murmurs, gallops or rubs. Abdomen:Soft. Bowel sounds present. Non-tender.  Extremities: No lower extremity edema. Pulses are 2 + in the bilateral DP/PT.  EKG:  EKG is  not ordered today. The ekg ordered today  demonstrates   Echo January 2022:   1. Left ventricular ejection fraction, by estimation, is 60 to 65%. The  left ventricle has normal function. The left ventricle has no regional  wall motion abnormalities. Left ventricular diastolic parameters are  indeterminate.  2. Right ventricular systolic function is normal. The right ventricular  size is mildly enlarged. There is normal pulmonary artery systolic  pressure.  3. Left atrial size was moderately dilated.  4. Right atrial size was moderately dilated.  5. The mitral valve is normal in structure. Mild to moderate mitral valve  regurgitation.  No evidence of mitral stenosis.  6. Tricuspid valve regurgitation is moderate.  7. The aortic valve is tricuspid. There is severe calcifcation of the  aortic valve. There is severe thickening of the aortic valve. Aortic valve  regurgitation is mild. Moderate to severe aortic valve stenosis. Aortic  valve mean gradient measures 28.7  mmHg. Aortic valve peak gradient measures 49.5 mmHg. Aortic valve area, by  VTI measures 0.80 cm.  8. Borderline pulmonary HTN, PASP is 30 mmHg.  9. The inferior vena cava is normal in size with greater than 50%  respiratory variability, suggesting right atrial pressure of 3 mmHg.   FINDINGS  Left Ventricle: Left ventricular ejection fraction, by estimation, is 60  to 65%. The left ventricle has normal function. The left ventricle has no  regional wall motion abnormalities. The left ventricular internal cavity  size was normal in size. There is  no left ventricular hypertrophy. Left ventricular diastolic parameters  are indeterminate.   Right Ventricle: The right ventricular size is mildly enlarged. No  increase in right ventricular wall thickness. Right ventricular systolic  function is normal. There is normal pulmonary artery systolic pressure.  The tricuspid regurgitant velocity is 2.68  m/s, and with an assumed right atrial pressure of 3 mmHg,  the estimated  right ventricular systolic pressure is 85.2 mmHg.   Left Atrium: Left atrial size was moderately dilated.   Right Atrium: Right atrial size was moderately dilated.   Pericardium: There is no evidence of pericardial effusion.   Mitral Valve: The mitral valve is normal in structure. Mild to moderate  mitral valve regurgitation. No evidence of mitral valve stenosis.   Tricuspid Valve: The tricuspid valve is normal in structure. Tricuspid  valve regurgitation is moderate . No evidence of tricuspid stenosis.   Aortic Valve: The aortic valve is tricuspid. There is severe calcifcation  of the aortic valve. There is severe thickening of the aortic valve. There  is severe aortic valve annular calcification. Aortic valve regurgitation  is mild. Aortic regurgitation  PHT measures 695 msec. Moderate to severe aortic stenosis is present.  Aortic valve mean gradient measures 28.7 mmHg. Aortic valve peak gradient  measures 49.5 mmHg. Aortic valve area, by VTI measures 0.80 cm.   Pulmonic Valve: The pulmonic valve was not well visualized. Pulmonic valve  regurgitation is mild. No evidence of pulmonic stenosis.   Aorta: The aortic root is normal in size and structure.   Pulmonary Artery: Borderline pulmonary HTN, PASP is 30 mmHg.   Venous: The inferior vena cava is normal in size with greater than 50%  respiratory variability, suggesting right atrial pressure of 3 mmHg.   IAS/Shunts: No atrial level shunt detected by color flow Doppler.     LEFT VENTRICLE  PLAX 2D  LVIDd:     4.20 cm  LVIDs:     2.70 cm  LV PW:     0.80 cm  LV IVS:    1.00 cm  LVOT diam:   2.00 cm  LV SV:     62  LV SV Index:  32  LVOT Area:   3.14 cm     RIGHT VENTRICLE  RV S prime:   11.60 cm/s  TAPSE (M-mode): 1.9 cm   LEFT ATRIUM       Index    RIGHT ATRIUM      Index  LA diam:    4.70 cm 2.42 cm/m RA Area:   23.70 cm  LA Vol (A2C):  94.3  ml 48.53  ml/m RA Volume:  70.30 ml 36.18 ml/m  LA Vol (A4C):  61.8 ml 31.81 ml/m  LA Biplane Vol: 79.0 ml 40.66 ml/m  AORTIC VALVE  AV Area (Vmax):  0.72 cm  AV Area (Vmean):  0.70 cm  AV Area (VTI):   0.80 cm  AV Vmax:      351.67 cm/s  AV Vmean:     249.000 cm/s  AV VTI:      0.767 m  AV Peak Grad:   49.5 mmHg  AV Mean Grad:   28.7 mmHg  LVOT Vmax:     80.10 cm/s  LVOT Vmean:    55.200 cm/s  LVOT VTI:     0.196 m  LVOT/AV VTI ratio: 0.26  AI PHT:      695 msec    AORTA  Ao Root diam: 3.30 cm   MITRAL VALVE        TRICUSPID VALVE  MV Area (PHT): 3.91 cm  TR Peak grad:  28.7 mmHg  MV Decel Time: 194 msec  TR Vmax:    268.00 cm/s  MV E velocity: 98.60 cm/s               SHUNTS               Systemic VTI: 0.20 m               Systemic Diam: 2.00 cm   Cardiac catheterization 10/10/2016 LM normal LAD mid 10; D1 ostial 20 LCx okay RCA normal Findings: 1. Mild, non-obstructive coronary artery disease. 2. Normal left and right heart filling pressures. 3. Low normal Fick cardiac output/index. 4. Moderate aortic stenosis. 5. Mild aortic regurgitation. 6. Mildly reduced left ventricular contraction (LVEF 45-50%).  Recent Labs: No results found for requested labs within last 8760 hours.   Lipid Panel    Component Value Date/Time   CHOL 160 01/09/2020 0816   TRIG 55 01/09/2020 0816   HDL 42 01/09/2020 0816   CHOLHDL 3.8 01/09/2020 0816   LDLCALC 107 (H) 01/09/2020 0816     Wt Readings from Last 3 Encounters:  08/23/20 164 lb 9.6 oz (74.7 kg)  03/16/20 161 lb (73 kg)  09/23/19 163 lb 12.8 oz (74.3 kg)     Other studies Reviewed: Additional studies/ records that were reviewed today include:  Review of the above records demonstrates:    Assessment and Plan:   1. Atrial fibrillation, persistent: Atrial fib today. Continue Coreg and Eliquis.    BMET and CBC since he is on Eliquis  2. Non-ischemic cardiomyopathy: LV function normal by echo January 2022  3. Aortic stenosis:  This is now just into the severe range with low gradients but AVA of 0.7 cm2 and dimensionless index 0.26. He is asymptomatic. Repeat echo in 6 months. He will call with change in his symptoms.   4. Hyperlipidemia: LDL 107. He refuses statin therapy.   5. CAD without angina: Mild CAD by cath in 2018. No ASA given use of Eliquis. Will continue statin and beta blocker.   Current medicines are reviewed at length with the patient today.  The patient does not have concerns regarding medicines.  The following changes have been made:  no change  Labs/ tests ordered today include:   Orders Placed This Encounter  Procedures  . Basic metabolic panel  . CBC  . ECHOCARDIOGRAM COMPLETE    Disposition:   FU with me in 6 months  Signed, Lauree Chandler, MD 08/23/2020 10:19 AM  [facility] [place], [place], [place]  [address] Phone: [phone]; Fax: [phone]

## 2020-08-23 NOTE — Patient Instructions (Addendum)
Medication Instructions:  Your physician recommends that you continue on your current medications as directed. Please refer to the Current Medication list given to you today.  *If you need a refill on your cardiac medications before your next appointment, please call your pharmacy*   Lab Work: Today: CBC and BMET If you have labs (blood work) drawn today and your tests are completely normal, you will receive your results only by: [name] MyChart Message (if you have MyChart) OR . A paper copy in the mail If you have any lab test that is abnormal or we need to change your treatment, we will call you to review the results.   Testing/Procedures: [place] Na end of July Your physician has requested that you have an echocardiogram. Echocardiography is a painless test that uses sound waves to create images of your heart. It provides your doctor with information about the size and shape of your heart and how well your heart's chambers and valves are working. This procedure takes approximately one hour. There are no restrictions for this procedure.     Follow-Up: At [facility], you and your health needs are our priority.  As part of our continuing mission to provide you with exceptional heart care, we have created designated Provider Care Teams.  These Care Teams include your primary Cardiologist (physician) and Advanced Practice Providers (APPs -  Physician Assistants and Nurse Practitioners) who all work together to provide you with the care you need, when you need it.  Your next appointment:   6 month(s) follow up appointment after echo appointment   The format for your next appointment:   In Person  Provider:   Lauree Chandler, MD

## 2020-08-29 ENCOUNTER — Ambulatory Visit (INDEPENDENT_AMBULATORY_CARE_PROVIDER_SITE_OTHER): Payer: Medicare Other | Admitting: Podiatry

## 2020-08-29 ENCOUNTER — Other Ambulatory Visit: Payer: Self-pay

## 2020-08-29 DIAGNOSIS — M79674 Pain in right toe(s): Secondary | ICD-10-CM | POA: Diagnosis not present

## 2020-08-29 DIAGNOSIS — B351 Tinea unguium: Secondary | ICD-10-CM

## 2020-08-29 DIAGNOSIS — M79675 Pain in left toe(s): Secondary | ICD-10-CM | POA: Diagnosis not present

## 2020-09-07 DIAGNOSIS — H35363 Drusen (degenerative) of macula, bilateral: Secondary | ICD-10-CM | POA: Diagnosis not present

## 2020-09-07 DIAGNOSIS — H25813 Combined forms of age-related cataract, bilateral: Secondary | ICD-10-CM | POA: Diagnosis not present

## 2020-09-07 DIAGNOSIS — H524 Presbyopia: Secondary | ICD-10-CM | POA: Diagnosis not present

## 2020-09-17 NOTE — Progress Notes (Signed)
   SUBJECTIVE Patient presents to office today complaining of elongated, thickened nails that cause pain while ambulating in shoes.  He is unable to trim his own nails. Patient is here for further evaluation and treatment.  Past Medical History:  Diagnosis Date  . Anxiety   . Atrial fibrillation ([place])   . Diarrhea   . Diverticulitis   . Erectile dysfunction   . 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 Headache(784.0)    x 3 - 4 per year  . Heart murmur    has had no problems   . Nonischemic cardiomyopathy ([place])     OBJECTIVE General Patient is awake, alert, and oriented x 3 and in no acute distress. Derm Skin is dry and supple bilateral. Negative open lesions or macerations. Remaining integument unremarkable. Nails are tender, long, thickened and dystrophic with subungual debris, consistent with onychomycosis, 1-5 bilateral. No signs of infection noted. Vasc  DP and PT pedal pulses palpable bilaterally. Temperature gradient within normal limits.  Neuro Epicritic and protective threshold sensation grossly intact bilaterally.  Musculoskeletal Exam No symptomatic pedal deformities noted bilateral. Muscular strength within normal limits.  ASSESSMENT 1. Onychodystrophic nails 1-5 bilateral with hyperkeratosis of nails.  2. Onychomycosis of nail due to dermatophyte bilateral 3. Pain in foot bilateral  PLAN OF CARE 1. Patient evaluated today.  2. Instructed to maintain good pedal hygiene and foot care.  3. Mechanical debridement of nails 1-5 bilaterally performed using a nail nipper. Filed with dremel without incident.  4. Return to clinic in 3 mos.   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 Edrick Kins, DPM    [address], [place] [address]                Office [phone]  Fax [phone]

## 2020-09-20 ENCOUNTER — Other Ambulatory Visit: Payer: Self-pay | Admitting: Cardiovascular Disease

## 2020-09-20 DIAGNOSIS — I428 Other cardiomyopathies: Secondary | ICD-10-CM

## 2020-09-20 NOTE — Telephone Encounter (Signed)
Pt last saw Dr Angelena Form 08/23/20, last labs Creat 0.88, age 75, weight 74.7kg, based on specified criteria pt is on appropriate dosage of Eliquis 5mg  BID for afib.  Will refill rx.

## 2020-09-25 DIAGNOSIS — E7489 Other specified disorders of carbohydrate metabolism: Secondary | ICD-10-CM | POA: Diagnosis not present

## 2020-09-27 DIAGNOSIS — L820 Inflamed seborrheic keratosis: Secondary | ICD-10-CM | POA: Diagnosis not present

## 2020-09-27 DIAGNOSIS — L821 Other seborrheic keratosis: Secondary | ICD-10-CM | POA: Diagnosis not present

## 2020-12-19 ENCOUNTER — Other Ambulatory Visit: Payer: Self-pay | Admitting: Cardiovascular Disease

## 2020-12-19 ENCOUNTER — Other Ambulatory Visit: Payer: Self-pay

## 2020-12-19 MED ORDER — CARVEDILOL 3.125 MG PO TABS: 3.1250 mg | ORAL_TABLET | Freq: Two times a day (BID) | ORAL | 2 refills | Status: DC

## 2020-12-19 NOTE — Telephone Encounter (Signed)
Pt's medication was sent to pt's pharmacy as requested. Confirmation received.  °

## 2020-12-19 NOTE — Telephone Encounter (Signed)
Pt last saw Dr Angelena Form 08/23/20, last labs 08/23/20 Creat 0.88, age 75, weight 74.7kg, based on specified criteria pt is on appropriate dosage of Eliquis 5mg  BID for afib.  Will refill rx.

## 2021-01-22 ENCOUNTER — Telehealth: Payer: Self-pay | Admitting: Physician Assistant

## 2021-01-22 NOTE — Telephone Encounter (Signed)
  [facility]   MULTIDISCIPLINARY HEART VALVE TEAM  Pt has been followed by Dr. Angelena Form for mod-severe AS that has been felt to be asymptomatic. He has a follow up echo scheduled for tomorrow. He told me that he has been dealing with a hernia that will require surgical repair. No incarceration and surgery will be elective. Pt does report some chronic fatigue that became somewhat worse last week, but seems to be lifting. I have moved up his appt with Dr. Angelena Form from December to 7/25 to review most recent echo, symptoms and surgical clearance vs starting TAVR work up.   Angelena Form PA-C  [facility]

## 2021-01-23 ENCOUNTER — Other Ambulatory Visit: Payer: Self-pay

## 2021-01-23 ENCOUNTER — Ambulatory Visit (HOSPITAL_COMMUNITY)
Admission: RE | Admit: 2021-01-23 | Discharge: 2021-01-23 | Disposition: A | Discharge status: Home / Self Care | Payer: Medicare Other | Source: Ambulatory Visit | Attending: Cardiovascular Disease | Admitting: Cardiovascular Disease

## 2021-01-23 DIAGNOSIS — I350 Nonrheumatic aortic (valve) stenosis: Secondary | ICD-10-CM | POA: Diagnosis not present

## 2021-01-23 LAB — ECHOCARDIOGRAM COMPLETE
AR max vel: 0.65 cm2
AV Area VTI: 0.64 cm2
AV Area mean vel: 0.62 cm2
AV Mean grad: 35.3 mmHg
AV Peak grad: 56.1 mmHg
Ao pk vel: 3.74 m/s
Area-P 1/2: 3.38 cm2
P 1/2 time: 379 msec
S' Lateral: 2 cm

## 2021-01-23 NOTE — Progress Notes (Signed)
*  PRELIMINARY RESULTS* Echocardiogram 2D Echocardiogram has been performed.  Nicholas Sutton 01/23/2021, 1:56 PM

## 2021-01-29 ENCOUNTER — Telehealth: Payer: Self-pay | Admitting: Cardiovascular Disease

## 2021-01-29 NOTE — Telephone Encounter (Signed)
Informed pt of results. Pt verbalized understanding. 

## 2021-01-29 NOTE — Telephone Encounter (Signed)
Patient is returning call from earlier this morning for results. Please advise pt further

## 2021-01-29 NOTE — Telephone Encounter (Signed)
Left message to call back  

## 2021-01-29 NOTE — Telephone Encounter (Signed)
Patient returning call for echo results. 

## 2021-02-10 NOTE — Progress Notes (Signed)
Chief Complaint  Patient presents with   Follow-up    Severe aortic stenosis    History of Present Illness: 75 yo male with history of non-ischemic cardiomyopathy, former tobacco abuse, persistent atrial fibrillation, aortic stenosis, mild aortic insufficiency and CAD who is here today for follow up. He has been followed in our office by Dr. Saunders Revel. I met him for the first time in July 2020. Echo in January 2018 with LVEF=35-40%. Cardiac cath March 2018 with mild CAD and normal LVEF. He has persistent atrial fibrillation and is on Eliquis and Coreg. He has moderately severe aortic stenosis. Echo August 2021 with LVEF=55-60%, moderately severe AS with mean gradient 28.5 mmHg, peak gradient 50.6 mmHg, AVA 0.77 cm2 and dimensionless index 0.24. mild to moderate AI. I saw him in the office in August 2021 and he was asymptomatic. Echo 08/16/20 with LVEF=60-65%. Mild to moderate MR. Moderately severe aortic stenosis with mean gradient 28.56mHg, peak gradient 49.5 mmHg, AVA 0.7 cm2, dimensionless index 0.26. No change in LV dimensions. Repeat echo 01/23/21 with LVEF=60-65%. His aortic stenosis has worsened. Now with mean gradient 35.3 mmHg, peak gradient 56 mmHg, AVA 0.65 cm2, dimensionless index 0.25.   He tells me today that he has had progressive fatigue and dyspnea on exertion. No chest pain, dizziness, near syncope or syncope. [place] He is divorced. 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 He may need a filling soon. He sees his dentist regularly.  He may need to have an elective hernia repair with Dr. 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 by Dr HExcell Seltzer  Headache(784.0)     x 3 - 4 per year   Heart murmur    has had no problems    Nonischemic cardiomyopathy ([facility]     Past Surgical History:  Procedure Laterality Date   APPENDECTOMY     COLON RESECTION     COLOSTOMY REVISION N/A 07/06/2013   Procedure: COLON RESECTION SIGMOID;  Surgeon: BEdward Jolly MD;  Location: [facility];  Service: General;  Laterality: N/A;   FISTULOTOMY N/A 07/06/2013   Procedure: repair of colovesicle fistula/ FISTULOTOMY;  Surgeon: BEdward Jolly MD;  Location: [facility];  Service: General;  Laterality: N/A;   GUM SURGERY     HERNIA REPAIR     INGUINAL HERNIA REPAIR Left 10/10/2015   Procedure: LEFT INGUINAL HERNIA REPAIR ;  Surgeon: MDonnie Mesa MD;  [facility]  Service: General;  Laterality: Left;   INSERTION OF MESH Left 10/10/2015   Procedure: INSERTION OF MESH;  Surgeon: MDonnie Mesa MD;  [facility]  Service: General;  Laterality: Left;   RIGHT/LEFT HEART CATH AND CORONARY ANGIOGRAPHY N/A 10/09/2016   Procedure: Right/Left Heart Cath and Coronary Angiography;  Surgeon: CNelva Bush MD;  [facility];  Service: Cardiovascular;  Laterality: N/A;    Current Outpatient Medications  Medication Sig Dispense Refill   acetaminophen (TYLENOL) 500 MG tablet Take 1,000 mg by mouth every 6 (six) hours as needed for headache.     Ascorbic Acid (VITAMIN C) 1000 MG tablet Take 1,000 mg by mouth 2 (two) times  daily.      carvedilol (COREG) 3.125 MG tablet Take 1 tablet (3.125 mg total) by mouth 2 (two) times daily. 180 tablet 2   ELIQUIS 5 MG TABS tablet TAKE 1 TABLET(5 MG) BY MOUTH TWICE DAILY 180 tablet 1   losartan (COZAAR) 25 MG tablet TAKE 1 TABLET(25 MG) BY MOUTH DAILY 90 tablet 3   Omega-3 Fatty Acids (FISH OIL) 1000 MG CPDR Take 1 capsule by mouth 2 (two) times daily.      tadalafil (CIALIS) 5 MG tablet Take 1 tablet by mouth as needed for erectile dysfunction.     VITAMIN D PO Take 500 mg by mouth daily.     No current  facility-administered medications for this visit.    Allergies  Allergen Reactions   Dust Mite Extract Other (See Comments)    Sneezing   Pollen Extract Other (See Comments)    Sneezing    Social History   Socioeconomic History   Marital status: Divorced    Spouse name: Not on file   Number of children: Not on file   Years of education: Not on file   Highest education level: Not on file  Occupational History   Not on file  Tobacco Use   Smoking status: Former    Packs/day: 1.50    Years: 30.00    Pack years: 45.00    Types: Cigarettes    Quit date: 06/03/2008    Years since quitting: 12.7   Smokeless tobacco: Never  Vaping Use   Vaping Use: Never used  Substance and Sexual Activity   Alcohol use: No    Comment: rare   Drug use: No   Sexual activity: Never    Birth control/protection: None  Other Topics Concern   Not on file  Social History Narrative   Not on file   Social Determinants of Health   Financial Resource Strain: Not on file  Food Insecurity: Not on file  Transportation Needs: Not on file  Physical Activity: Not on file  Stress: Not on file  Social Connections: Not on file  Intimate Partner Violence: Not on file    Family History  Problem Relation Age of Onset   Stroke Mother 32   Heart attack Father 40   Hypertension Unknown     Review of Systems:  As stated in the HPI and otherwise negative.   BP 90/60   Pulse 80   Ht 6' (1.829 m)   Wt 164 lb (74.4 kg)   SpO2 98%   BMI 22.24 kg/m   Physical Examination:  General: Well developed, well nourished, NAD  HEENT: OP clear, mucus membranes moist  SKIN: warm, dry. No rashes. Neuro: No focal deficits  Musculoskeletal: Muscle strength 5/5 all ext  Psychiatric: Mood and affect normal  Neck: No JVD, no carotid bruits, no thyromegaly, no lymphadenopathy.  Lungs:Clear bilaterally, no wheezes, rhonci, crackles Cardiovascular: Regular rate and rhythm. loud, harsh systolic murmur.   Abdomen:Soft. Bowel sounds present. Non-tender.  Extremities: No lower extremity edema. Pulses are 2 + in the bilateral DP/PT. . EKG:  EKG is ordered today. The ekg ordered today demonstrates atrial fib  Echo July 2022:  1. Left ventricular ejection fraction, by estimation, is 60 to 65%. The  left ventricle has normal function. The left ventricle has no regional  wall motion abnormalities. Left ventricular diastolic parameters are  indeterminate.   2. Right ventricular systolic function is normal. The right ventricular  size is normal. There is normal pulmonary artery  systolic pressure.   3. Left atrial size was mildly dilated.   4. Right atrial size was moderately dilated.   5. The mitral valve is abnormal. No evidence of mitral valve  regurgitation. No evidence of mitral stenosis.   6. Since TTE done 08/16/20 mean gradient increased from 28.7 to 35.3 mmHg  and peak from 49.5 to 56 mmHg            DVI 0.25 and AVA 0.65 cm2 consistent wtih severe AS Fused right and  left cusps. The aortic valve is calcified. Aortic valve regurgitation is  mild. Severe aortic valve stenosis.   7. The inferior vena cava is normal in size with greater than 50%  respiratory variability, suggesting right atrial pressure of 3 mmHg.   FINDINGS   Left Ventricle: Left ventricular ejection fraction, by estimation, is 60  to 65%. The left ventricle has normal function. The left ventricle has no  regional wall motion abnormalities. The left ventricular internal cavity  size was normal in size. There is   no left ventricular hypertrophy. Left ventricular diastolic parameters  are indeterminate.   Right Ventricle: The right ventricular size is normal. No increase in  right ventricular wall thickness. Right ventricular systolic function is  normal. There is normal pulmonary artery systolic pressure. The tricuspid  regurgitant velocity is 2.65 m/s, and   with an assumed right atrial pressure of 3 mmHg, the  estimated right  ventricular systolic pressure is 54.5 mmHg.   Left Atrium: Left atrial size was mildly dilated.   Right Atrium: Right atrial size was moderately dilated.   Pericardium: There is no evidence of pericardial effusion.   Mitral Valve: The mitral valve is abnormal. There is mild thickening of  the mitral valve leaflet(s). There is mild calcification of the mitral  valve leaflet(s). Mild mitral annular calcification. No evidence of mitral  valve regurgitation. No evidence of  mitral valve stenosis.   Tricuspid Valve: The tricuspid valve is normal in structure. Tricuspid  valve regurgitation is mild . No evidence of tricuspid stenosis.   Aortic Valve: Since TTE done 08/16/20 mean gradient increased from 28.7 to  35.3 mmHg and peak from 49.5 to 56 mmHg   DVI 0.25 and AVA 0.65 cm2 consistent wtih severe AS Fused right and left  cusps. The aortic valve is calcified. Aortic valve regurgitation is mild.  Aortic regurgitation PHT measures 379 msec. Severe aortic stenosis is  present. Aortic valve mean gradient  measures 35.3 mmHg. Aortic valve peak gradient measures 56.1 mmHg. Aortic  valve area, by VTI measures 0.64 cm.   Pulmonic Valve: The pulmonic valve was normal in structure. Pulmonic valve  regurgitation is not visualized. No evidence of pulmonic stenosis.   Aorta: The aortic root is normal in size and structure.   Venous: The inferior vena cava is normal in size with greater than 50%  respiratory variability, suggesting right atrial pressure of 3 mmHg.   IAS/Shunts: No atrial level shunt detected by color flow Doppler.      LEFT VENTRICLE  PLAX 2D  LVIDd:         4.00 cm  LVIDs:         2.00 cm  LV PW:         0.90 cm  LV IVS:        0.90 cm  LVOT diam:     1.80 cm  LV SV:         56  LV SV Index:  28  LVOT Area:     2.54 cm      RIGHT VENTRICLE  RV S prime:     13.10 cm/s  TAPSE (M-mode): 2.2 cm   LEFT ATRIUM             Index       RIGHT ATRIUM            Index  LA diam:        3.90 cm 1.99 cm/m  RA Area:     22.40 cm  LA Vol (A2C):   83.0 ml 42.32 ml/m RA Volume:   61.40 ml  31.31 ml/m  LA Vol (A4C):   66.7 ml 34.01 ml/m  LA Biplane Vol: 77.2 ml 39.36 ml/m   AORTIC VALVE  AV Area (Vmax):    0.65 cm  AV Area (Vmean):   0.62 cm  AV Area (VTI):     0.64 cm  AV Vmax:           374.33 cm/s  AV Vmean:          278.000 cm/s  AV VTI:            0.862 m  AV Peak Grad:      56.1 mmHg  AV Mean Grad:      35.3 mmHg  LVOT Vmax:         95.95 cm/s  LVOT Vmean:        67.950 cm/s  LVOT VTI:          0.218 m  LVOT/AV VTI ratio: 0.25  AI PHT:            379 msec     AORTA  Ao Root diam: 3.40 cm   MITRAL VALVE              TRICUSPID VALVE  MV Area (PHT): 3.38 cm   TR Peak grad:   28.1 mmHg  MV Decel Time: 225 msec   TR Vmax:        265.00 cm/s  MV E velocity: 1.10 cm/s                            SHUNTS                            Systemic VTI:  0.22 m                            Systemic Diam: 1.80 cm   Cardiac catheterization 10/10/2016 LM normal LAD mid 10; D1 ostial 20 LCx okay RCA normal Findings: 1.  Mild, non-obstructive coronary artery disease. 2.  Normal left and right heart filling pressures. 3.  Low normal Fick cardiac output/index. 4.  Moderate aortic stenosis. 5.  Mild aortic regurgitation. 6.  Mildly reduced left ventricular contraction (LVEF 45-50%).  Recent Labs: 08/23/2020: BUN 17; Creatinine, Ser 0.88; Hemoglobin 13.8; Platelets 151; Potassium 4.4; Sodium 137   Lipid Panel    Component Value Date/Time   CHOL 160 01/09/2020 0816   TRIG 55 01/09/2020 0816   HDL 42 01/09/2020 0816   CHOLHDL 3.8 01/09/2020 0816   LDLCALC 107 (H) 01/09/2020 0816     Wt Readings from Last 3 Encounters:  02/11/21 164 lb (74.4 kg)  08/23/20 164 lb 9.6 oz (74.7 kg)  03/16/20 161 lb (73 kg)  Other studies Reviewed: Additional studies/ records that were reviewed today include:  Review of the above records  demonstrates:   STS Risk Score:  Procedure: AVR + CAB Risk of Mortality: 1.570% Renal Failure: 1.255% Permanent Stroke: 1.334% Prolonged Ventilation: 4.791% DSW Infection: 0.070% Reoperation: 3.226% Morbidity or Mortality: 7.776% Short Length of Stay: 46.588% Long Length of Stay: 3.705%  Assessment and Plan:   1. Atrial fibrillation, persistent: He is in atrial fib today with controlled rate. Will continue Coreg, Eliquis.   2. Non-ischemic cardiomyopathy: LV function normal by echo July 2022  3. Severe Aortic stenosis:  He has severe, stage D aortic valve stenosis. I have personally reviewed the echo images. The aortic valve is thickened, calcified with limited leaflet mobility. I think he would benefit from AVR. He would be a candidate for surgical AVR or TAVR.    I have reviewed the natural history of aortic stenosis with the patient and their family members  who are present today. We have discussed the limitations of medical therapy and the poor prognosis associated with symptomatic aortic stenosis. We have reviewed potential treatment options, including palliative medical therapy, conventional surgical aortic valve replacement, and transcatheter aortic valve replacement. We discussed treatment options in the context of the patient's specific comorbid medical conditions.    He would like to proceed with planning for TAVR. I will arrange a right and left heart catheterization at [facility] 02/13/21. Risks and benefits of the cath and valve procedure reviewed with the patient. After the cath, he will have a cardiac CT, CTA of the chest/abdomen and pelvis, carotid artery dopplers and will then be referred to see one of the CT surgeons on our TAVR team.   4. Hyperlipidemia: LDL 107. He refuses statin therapy.   5. CAD without angina: Mild CAD by cath in 2018. No ASA given use of Eliquis. Will continue statin and beta blocker.   Current medicines are reviewed at length with the patient  today.  The patient does not have concerns regarding medicines.  The following changes have been made:  no change  Labs/ tests ordered today include:   Orders Placed This Encounter  Procedures   Basic metabolic panel   CBC   EKG 12-Lead     Disposition:   FU with me following his TAVR  Signed, Lauree Chandler, MD 02/11/2021 9:21 AM    [facility] [address], [place], [place]  [address] Phone: [phone]; Fax: [phone]

## 2021-02-10 NOTE — H&P (View-Only) (Signed)
Chief Complaint  Patient presents with   Follow-up    Severe aortic stenosis    History of Present Illness: 75 yo male with history of non-ischemic cardiomyopathy, former tobacco abuse, persistent atrial fibrillation, aortic stenosis, mild aortic insufficiency and CAD who is here today for follow up. He has been followed in our office by Dr. Saunders Revel. I met him for the first time in July 2020. Echo in January 2018 with LVEF=35-40%. Cardiac cath March 2018 with mild CAD and normal LVEF. He has persistent atrial fibrillation and is on Eliquis and Coreg. He has moderately severe aortic stenosis. Echo August 2021 with LVEF=55-60%, moderately severe AS with mean gradient 28.5 mmHg, peak gradient 50.6 mmHg, AVA 0.77 cm2 and dimensionless index 0.24. mild to moderate AI. I saw him in the office in August 2021 and he was asymptomatic. Echo 08/16/20 with LVEF=60-65%. Mild to moderate MR. Moderately severe aortic stenosis with mean gradient 28.78mHg, peak gradient 49.5 mmHg, AVA 0.7 cm2, dimensionless index 0.26. No change in LV dimensions. Repeat echo 01/23/21 with LVEF=60-65%. His aortic stenosis has worsened. Now with mean gradient 35.3 mmHg, peak gradient 56 mmHg, AVA 0.65 cm2, dimensionless index 0.25.   He tells me today that he has had progressive fatigue and dyspnea on exertion. No chest pain, dizziness, near syncope or syncope. [place] He is divorced. 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 He may need a filling soon. He sees his dentist regularly.  He may need to have an elective hernia repair with Dr. 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 by Dr HExcell Seltzer  Headache(784.0)     x 3 - 4 per year   Heart murmur    has had no problems    Nonischemic cardiomyopathy ([facility]     Past Surgical History:  Procedure Laterality Date   APPENDECTOMY     COLON RESECTION     COLOSTOMY REVISION N/A 07/06/2013   Procedure: COLON RESECTION SIGMOID;  Surgeon: BEdward Jolly MD;  Location: [facility];  Service: General;  Laterality: N/A;   FISTULOTOMY N/A 07/06/2013   Procedure: repair of colovesicle fistula/ FISTULOTOMY;  Surgeon: BEdward Jolly MD;  Location: [facility];  Service: General;  Laterality: N/A;   GUM SURGERY     HERNIA REPAIR     INGUINAL HERNIA REPAIR Left 10/10/2015   Procedure: LEFT INGUINAL HERNIA REPAIR ;  Surgeon: MDonnie Mesa MD;  [place] [facility]  Service: General;  Laterality: Left;   INSERTION OF MESH Left 10/10/2015   Procedure: INSERTION OF MESH;  Surgeon: MDonnie Mesa MD;  [place] [facility]  Service: General;  Laterality: Left;   RIGHT/LEFT HEART CATH AND CORONARY ANGIOGRAPHY N/A 10/09/2016   Procedure: Right/Left Heart Cath and Coronary Angiography;  Surgeon: CNelva Bush MD;  [facility];  Service: Cardiovascular;  Laterality: N/A;    Current Outpatient Medications  Medication Sig Dispense Refill   acetaminophen (TYLENOL) 500 MG tablet Take 1,000 mg by mouth every 6 (six) hours as needed for headache.     Ascorbic Acid (VITAMIN C) 1000 MG tablet Take 1,000 mg by mouth 2 (two) times  daily.      carvedilol (COREG) 3.125 MG tablet Take 1 tablet (3.125 mg total) by mouth 2 (two) times daily. 180 tablet 2   ELIQUIS 5 MG TABS tablet TAKE 1 TABLET(5 MG) BY MOUTH TWICE DAILY 180 tablet 1   losartan (COZAAR) 25 MG tablet TAKE 1 TABLET(25 MG) BY MOUTH DAILY 90 tablet 3   Omega-3 Fatty Acids (FISH OIL) 1000 MG CPDR Take 1 capsule by mouth 2 (two) times daily.      tadalafil (CIALIS) 5 MG tablet Take 1 tablet by mouth as needed for erectile dysfunction.     VITAMIN D PO Take 500 mg by mouth daily.     No current  facility-administered medications for this visit.    Allergies  Allergen Reactions   Dust Mite Extract Other (See Comments)    Sneezing   Pollen Extract Other (See Comments)    Sneezing    Social History   Socioeconomic History   Marital status: Divorced    Spouse name: Not on file   Number of children: Not on file   Years of education: Not on file   Highest education level: Not on file  Occupational History   Not on file  Tobacco Use   Smoking status: Former    Packs/day: 1.50    Years: 30.00    Pack years: 45.00    Types: Cigarettes    Quit date: 06/03/2008    Years since quitting: 12.7   Smokeless tobacco: Never  Vaping Use   Vaping Use: Never used  Substance and Sexual Activity   Alcohol use: No    Comment: rare   Drug use: No   Sexual activity: Never    Birth control/protection: None  Other Topics Concern   Not on file  Social History Narrative   Not on file   Social Determinants of Health   Financial Resource Strain: Not on file  Food Insecurity: Not on file  Transportation Needs: Not on file  Physical Activity: Not on file  Stress: Not on file  Social Connections: Not on file  Intimate Partner Violence: Not on file    Family History  Problem Relation Age of Onset   Stroke Mother 44   Heart attack Father 25   Hypertension Unknown     Review of Systems:  As stated in the HPI and otherwise negative.   BP 90/60   Pulse 80   Ht 6' (1.829 m)   Wt 164 lb (74.4 kg)   SpO2 98%   BMI 22.24 kg/m   Physical Examination:  General: Well developed, well nourished, NAD  HEENT: OP clear, mucus membranes moist  SKIN: warm, dry. No rashes. Neuro: No focal deficits  Musculoskeletal: Muscle strength 5/5 all ext  Psychiatric: Mood and affect normal  Neck: No JVD, no carotid bruits, no thyromegaly, no lymphadenopathy.  Lungs:Clear bilaterally, no wheezes, rhonci, crackles Cardiovascular: Regular rate and rhythm. loud, harsh systolic murmur.   Abdomen:Soft. Bowel sounds present. Non-tender.  Extremities: No lower extremity edema. Pulses are 2 + in the bilateral DP/PT. . EKG:  EKG is ordered today. The ekg ordered today demonstrates atrial fib  Echo July 2022:  1. Left ventricular ejection fraction, by estimation, is 60 to 65%. The  left ventricle has normal function. The left ventricle has no regional  wall motion abnormalities. Left ventricular diastolic parameters are  indeterminate.   2. Right ventricular systolic function is normal. The right ventricular  size is normal. There is normal pulmonary artery  systolic pressure.   3. Left atrial size was mildly dilated.   4. Right atrial size was moderately dilated.   5. The mitral valve is abnormal. No evidence of mitral valve  regurgitation. No evidence of mitral stenosis.   6. Since TTE done 08/16/20 mean gradient increased from 28.7 to 35.3 mmHg  and peak from 49.5 to 56 mmHg            DVI 0.25 and AVA 0.65 cm2 consistent wtih severe AS Fused right and  left cusps. The aortic valve is calcified. Aortic valve regurgitation is  mild. Severe aortic valve stenosis.   7. The inferior vena cava is normal in size with greater than 50%  respiratory variability, suggesting right atrial pressure of 3 mmHg.   FINDINGS   Left Ventricle: Left ventricular ejection fraction, by estimation, is 60  to 65%. The left ventricle has normal function. The left ventricle has no  regional wall motion abnormalities. The left ventricular internal cavity  size was normal in size. There is   no left ventricular hypertrophy. Left ventricular diastolic parameters  are indeterminate.   Right Ventricle: The right ventricular size is normal. No increase in  right ventricular wall thickness. Right ventricular systolic function is  normal. There is normal pulmonary artery systolic pressure. The tricuspid  regurgitant velocity is 2.65 m/s, and   with an assumed right atrial pressure of 3 mmHg, the  estimated right  ventricular systolic pressure is 67.6 mmHg.   Left Atrium: Left atrial size was mildly dilated.   Right Atrium: Right atrial size was moderately dilated.   Pericardium: There is no evidence of pericardial effusion.   Mitral Valve: The mitral valve is abnormal. There is mild thickening of  the mitral valve leaflet(s). There is mild calcification of the mitral  valve leaflet(s). Mild mitral annular calcification. No evidence of mitral  valve regurgitation. No evidence of  mitral valve stenosis.   Tricuspid Valve: The tricuspid valve is normal in structure. Tricuspid  valve regurgitation is mild . No evidence of tricuspid stenosis.   Aortic Valve: Since TTE done 08/16/20 mean gradient increased from 28.7 to  35.3 mmHg and peak from 49.5 to 56 mmHg   DVI 0.25 and AVA 0.65 cm2 consistent wtih severe AS Fused right and left  cusps. The aortic valve is calcified. Aortic valve regurgitation is mild.  Aortic regurgitation PHT measures 379 msec. Severe aortic stenosis is  present. Aortic valve mean gradient  measures 35.3 mmHg. Aortic valve peak gradient measures 56.1 mmHg. Aortic  valve area, by VTI measures 0.64 cm.   Pulmonic Valve: The pulmonic valve was normal in structure. Pulmonic valve  regurgitation is not visualized. No evidence of pulmonic stenosis.   Aorta: The aortic root is normal in size and structure.   Venous: The inferior vena cava is normal in size with greater than 50%  respiratory variability, suggesting right atrial pressure of 3 mmHg.   IAS/Shunts: No atrial level shunt detected by color flow Doppler.      LEFT VENTRICLE  PLAX 2D  LVIDd:         4.00 cm  LVIDs:         2.00 cm  LV PW:         0.90 cm  LV IVS:        0.90 cm  LVOT diam:     1.80 cm  LV SV:         56  LV SV Index:  28  LVOT Area:     2.54 cm      RIGHT VENTRICLE  RV S prime:     13.10 cm/s  TAPSE (M-mode): 2.2 cm   LEFT ATRIUM             Index       RIGHT ATRIUM            Index  LA diam:        3.90 cm 1.99 cm/m  RA Area:     22.40 cm  LA Vol (A2C):   83.0 ml 42.32 ml/m RA Volume:   61.40 ml  31.31 ml/m  LA Vol (A4C):   66.7 ml 34.01 ml/m  LA Biplane Vol: 77.2 ml 39.36 ml/m   AORTIC VALVE  AV Area (Vmax):    0.65 cm  AV Area (Vmean):   0.62 cm  AV Area (VTI):     0.64 cm  AV Vmax:           374.33 cm/s  AV Vmean:          278.000 cm/s  AV VTI:            0.862 m  AV Peak Grad:      56.1 mmHg  AV Mean Grad:      35.3 mmHg  LVOT Vmax:         95.95 cm/s  LVOT Vmean:        67.950 cm/s  LVOT VTI:          0.218 m  LVOT/AV VTI ratio: 0.25  AI PHT:            379 msec     AORTA  Ao Root diam: 3.40 cm   MITRAL VALVE              TRICUSPID VALVE  MV Area (PHT): 3.38 cm   TR Peak grad:   28.1 mmHg  MV Decel Time: 225 msec   TR Vmax:        265.00 cm/s  MV E velocity: 1.10 cm/s                            SHUNTS                            Systemic VTI:  0.22 m                            Systemic Diam: 1.80 cm   Cardiac catheterization 10/10/2016 LM normal LAD mid 10; D1 ostial 20 LCx okay RCA normal Findings: 1.  Mild, non-obstructive coronary artery disease. 2.  Normal left and right heart filling pressures. 3.  Low normal Fick cardiac output/index. 4.  Moderate aortic stenosis. 5.  Mild aortic regurgitation. 6.  Mildly reduced left ventricular contraction (LVEF 45-50%).  Recent Labs: 08/23/2020: BUN 17; Creatinine, Ser 0.88; Hemoglobin 13.8; Platelets 151; Potassium 4.4; Sodium 137   Lipid Panel    Component Value Date/Time   CHOL 160 01/09/2020 0816   TRIG 55 01/09/2020 0816   HDL 42 01/09/2020 0816   CHOLHDL 3.8 01/09/2020 0816   LDLCALC 107 (H) 01/09/2020 0816     Wt Readings from Last 3 Encounters:  02/11/21 164 lb (74.4 kg)  08/23/20 164 lb 9.6 oz (74.7 kg)  03/16/20 161 lb (73 kg)  Other studies Reviewed: Additional studies/ records that were reviewed today include:  Review of the above records  demonstrates:   STS Risk Score:  Procedure: AVR + CAB Risk of Mortality: 1.570% Renal Failure: 1.255% Permanent Stroke: 1.334% Prolonged Ventilation: 4.791% DSW Infection: 0.070% Reoperation: 3.226% Morbidity or Mortality: 7.776% Short Length of Stay: 46.588% Long Length of Stay: 3.705%  Assessment and Plan:   1. Atrial fibrillation, persistent: He is in atrial fib today with controlled rate. Will continue Coreg, Eliquis.   2. Non-ischemic cardiomyopathy: LV function normal by echo July 2022  3. Severe Aortic stenosis:  He has severe, stage D aortic valve stenosis. I have personally reviewed the echo images. The aortic valve is thickened, calcified with limited leaflet mobility. I think he would benefit from AVR. He would be a candidate for surgical AVR or TAVR.    I have reviewed the natural history of aortic stenosis with the patient and their family members  who are present today. We have discussed the limitations of medical therapy and the poor prognosis associated with symptomatic aortic stenosis. We have reviewed potential treatment options, including palliative medical therapy, conventional surgical aortic valve replacement, and transcatheter aortic valve replacement. We discussed treatment options in the context of the patient's specific comorbid medical conditions.    He would like to proceed with planning for TAVR. I will arrange a right and left heart catheterization at [facility] 02/13/21. Risks and benefits of the cath and valve procedure reviewed with the patient. After the cath, he will have a cardiac CT, CTA of the chest/abdomen and pelvis, carotid artery dopplers and will then be referred to see one of the CT surgeons on our TAVR team.   4. Hyperlipidemia: LDL 107. He refuses statin therapy.   5. CAD without angina: Mild CAD by cath in 2018. No ASA given use of Eliquis. Will continue statin and beta blocker.   Current medicines are reviewed at length with the patient  today.  The patient does not have concerns regarding medicines.  The following changes have been made:  no change  Labs/ tests ordered today include:   Orders Placed This Encounter  Procedures   Basic metabolic panel   CBC   EKG 12-Lead     Disposition:   FU with me following his TAVR  Signed, Lauree Chandler, MD 02/11/2021 9:21 AM    [facility] [place], [place], [place]  [address] Phone: [phone]; Fax: [phone]

## 2021-02-11 ENCOUNTER — Ambulatory Visit (INDEPENDENT_AMBULATORY_CARE_PROVIDER_SITE_OTHER): Payer: Medicare Other | Admitting: Cardiovascular Disease

## 2021-02-11 ENCOUNTER — Encounter: Payer: Self-pay | Admitting: Cardiovascular Disease

## 2021-02-11 ENCOUNTER — Other Ambulatory Visit: Payer: Self-pay

## 2021-02-11 VITALS — BP 90/60 | HR 80 | Ht 72.0 in | Wt 164.0 lb

## 2021-02-11 DIAGNOSIS — I2510 Atherosclerotic heart disease of native coronary artery without angina pectoris: Secondary | ICD-10-CM | POA: Diagnosis not present

## 2021-02-11 DIAGNOSIS — Z01812 Encounter for preprocedural laboratory examination: Secondary | ICD-10-CM

## 2021-02-11 DIAGNOSIS — I4819 Other persistent atrial fibrillation: Secondary | ICD-10-CM

## 2021-02-11 DIAGNOSIS — I350 Nonrheumatic aortic (valve) stenosis: Secondary | ICD-10-CM

## 2021-02-11 LAB — BASIC METABOLIC PANEL
BUN/Creatinine Ratio: 16 (ref 10–24)
BUN: 16 mg/dL (ref 8–27)
CO2: 25 mmol/L (ref 20–29)
Calcium: 9.6 mg/dL (ref 8.6–10.2)
Chloride: 102 mmol/L (ref 96–106)
Creatinine, Ser: 0.98 mg/dL (ref 0.76–1.27)
Glucose: 67 mg/dL (ref 65–99)
Potassium: 4.4 mmol/L (ref 3.5–5.2)
Sodium: 140 mmol/L (ref 134–144)
eGFR: 82 mL/min/{1.73_m2} (ref >59)

## 2021-02-11 LAB — CBC
Hematocrit: 44.4 % (ref 37.5–51.0)
Hemoglobin: 14.6 g/dL (ref 13.0–17.7)
MCH: 29.8 pg (ref 26.6–33.0)
MCHC: 32.9 g/dL (ref 31.5–35.7)
MCV: 91 fL (ref 79–97)
Platelets: 172 10*3/uL (ref 150–450)
RBC: 4.9 x10E6/uL (ref 4.14–5.80)
RDW: 14.2 % (ref 11.6–15.4)
WBC: 9.5 10*3/uL (ref 3.4–10.8)

## 2021-02-11 NOTE — Addendum Note (Signed)
Addended by: Lauree Chandler D on: 02/11/2021 09:23 AM   Modules accepted: Orders, SmartSet

## 2021-02-11 NOTE — Patient Instructions (Signed)
Medication Instructions:  No changes today *If you need a refill on your cardiac medications before your next appointment, please call your pharmacy*   Lab Work: Today: cbc, bmet   Testing/Procedures: Your physician has requested that you have a cardiac catheterization. Cardiac catheterization is used to diagnose and/or treat various heart conditions. Doctors may recommend this procedure for a number of different reasons. The most common reason is to evaluate chest pain. Chest pain can be a symptom of coronary artery disease (CAD), and cardiac catheterization can show whether plaque is narrowing or blocking your heart's arteries. This procedure is also used to evaluate the valves, as well as measure the blood flow and oxygen levels in different parts of your heart. For further information please visit [facility]. Please follow instruction sheet, as given.   Follow-Up: Per Structural Heart Team   Other Instructions  [place] OFFICE [place], [place] [address] Dept: [phone] Loc: [place]  02/11/2021  You are scheduled for a Cardiac Catheterization on Wednesday, July 27 with Dr. Lauree Chandler.  1. Please arrive at the [facility] (Main Entrance A) at [facility]: [address] [place], [place] [address] at 8:00 AM (This time is two hours before your procedure to ensure your preparation). Free valet parking service is available.   Special note: Every effort is made to have your procedure done on time. Please understand that emergencies sometimes delay scheduled procedures.  2. Diet: Do not eat solid foods after midnight.  The patient may have clear liquids until 5am upon the day of the procedure.  3. Labs: You will need to have blood drawn on Monday, July 25 at [facility]. [address]   Open: 7:30am - 5pm    Phone: [phone]. You do not need to be fasting.  4. Medication instructions in preparation for your procedure:   Contrast Allergy: No  Hold Eliquis starting now. You will be instructed on when to restart after your cath procedure.  On the morning of your procedure, take your Aspirin and any morning medicines NOT listed above.  You may use sips of water.  5. Plan for one night stay--bring personal belongings. 6. Bring a current list of your medications and current insurance cards. 7. You MUST have a responsible person to drive you home. 8. Someone MUST be with you the first 24 hours after you arrive home or your discharge will be delayed. 9. Please wear clothes that are easy to get on and off and wear slip-on shoes.  Thank you for allowing [place] to care for you!   --  Invasive Cardiovascular services

## 2021-02-12 ENCOUNTER — Telehealth: Payer: Self-pay | Admitting: *Deleted

## 2021-02-12 NOTE — Telephone Encounter (Addendum)
Pt contacted pre-catheterization scheduled at [facility] for: Wednesday February 13, 2021 10 AM Verified arrival time and place: [place] [facility]) at: 8 AM   No solid food after midnight prior to cath, clear liquids until 5 AM day of procedure.  Hold: Eliquis-none starting PM dose 02/11/21 until post procedure  Except hold medications AM meds can be  taken pre-cath with sips of water including: aspirin 81 mg   Confirmed patient has responsible adult to drive home post procedure and be with patient first 24 hours after arriving home: yes   You are allowed ONE visitor in the waiting room during the time you are at the hospital for your procedure. Both you and your visitor must wear a mask once you enter the hospital.   Patient reports does not currently have any symptoms concerning for COVID-19 and no household members with COVID-19 like illness.   Reviewed procedure/mask/visitor instructions with patient.

## 2021-02-12 NOTE — Telephone Encounter (Signed)
Reviewed procedure/mask/visitor instructions with patient. 

## 2021-02-12 NOTE — Telephone Encounter (Signed)
Pt is returning call to [facility] from this morning. Please advise pt further

## 2021-02-13 ENCOUNTER — Other Ambulatory Visit: Payer: Self-pay

## 2021-02-13 ENCOUNTER — Encounter (HOSPITAL_COMMUNITY)
Admission: RE | Disposition: A | Discharge status: Home / Self Care | Payer: Self-pay | Source: Home / Self Care | Attending: Cardiovascular Disease

## 2021-02-13 ENCOUNTER — Ambulatory Visit (HOSPITAL_COMMUNITY)
Admission: RE | Admit: 2021-02-13 | Discharge: 2021-02-13 | Disposition: A | Discharge status: Home / Self Care | Payer: Medicare Other | Attending: Cardiovascular Disease | Admitting: Cardiovascular Disease

## 2021-02-13 DIAGNOSIS — Z79899 Other long term (current) drug therapy: Secondary | ICD-10-CM | POA: Insufficient documentation

## 2021-02-13 DIAGNOSIS — I428 Other cardiomyopathies: Secondary | ICD-10-CM | POA: Diagnosis not present

## 2021-02-13 DIAGNOSIS — I352 Nonrheumatic aortic (valve) stenosis with insufficiency: Secondary | ICD-10-CM | POA: Insufficient documentation

## 2021-02-13 DIAGNOSIS — E785 Hyperlipidemia, unspecified: Secondary | ICD-10-CM | POA: Diagnosis not present

## 2021-02-13 DIAGNOSIS — Z7901 Long term (current) use of anticoagulants: Secondary | ICD-10-CM | POA: Diagnosis not present

## 2021-02-13 DIAGNOSIS — Z87891 Personal history of nicotine dependence: Secondary | ICD-10-CM | POA: Insufficient documentation

## 2021-02-13 DIAGNOSIS — Z91048 Other nonmedicinal substance allergy status: Secondary | ICD-10-CM | POA: Insufficient documentation

## 2021-02-13 DIAGNOSIS — I350 Nonrheumatic aortic (valve) stenosis: Secondary | ICD-10-CM

## 2021-02-13 DIAGNOSIS — I2510 Atherosclerotic heart disease of native coronary artery without angina pectoris: Secondary | ICD-10-CM | POA: Diagnosis not present

## 2021-02-13 DIAGNOSIS — I4819 Other persistent atrial fibrillation: Secondary | ICD-10-CM | POA: Insufficient documentation

## 2021-02-13 HISTORY — PX: RIGHT/LEFT HEART CATH AND CORONARY ANGIOGRAPHY: CATH118266

## 2021-02-13 LAB — POCT I-STAT EG7
Acid-Base Excess: 0 mmol/L (ref 0.0–2.0)
Bicarbonate: 26.5 mmol/L (ref 20.0–28.0)
Calcium, Ion: 1.21 mmol/L (ref 1.15–1.40)
HCT: 40 % (ref 39.0–52.0)
Hemoglobin: 13.6 g/dL (ref 13.0–17.0)
O2 Saturation: 71 %
Potassium: 4 mmol/L (ref 3.5–5.1)
Sodium: 141 mmol/L (ref 135–145)
TCO2: 28 mmol/L (ref 22–32)
pCO2, Ven: 50.5 mmHg (ref 44.0–60.0)
pH, Ven: 7.329 (ref 7.250–7.430)
pO2, Ven: 40 mmHg (ref 32.0–45.0)

## 2021-02-13 LAB — POCT I-STAT 7, (LYTES, BLD GAS, ICA,H+H)
Acid-Base Excess: 0 mmol/L (ref 0.0–2.0)
Bicarbonate: 26.7 mmol/L (ref 20.0–28.0)
Calcium, Ion: 1.24 mmol/L (ref 1.15–1.40)
HCT: 41 % (ref 39.0–52.0)
Hemoglobin: 13.9 g/dL (ref 13.0–17.0)
O2 Saturation: 99 %
Potassium: 4.1 mmol/L (ref 3.5–5.1)
Sodium: 140 mmol/L (ref 135–145)
TCO2: 28 mmol/L (ref 22–32)
pCO2 arterial: 50.5 mmHg — ABNORMAL HIGH (ref 32.0–48.0)
pH, Arterial: 7.331 — ABNORMAL LOW (ref 7.350–7.450)
pO2, Arterial: 143 mmHg — ABNORMAL HIGH (ref 83.0–108.0)

## 2021-02-13 SURGERY — RIGHT/LEFT HEART CATH AND CORONARY ANGIOGRAPHY
Anesthesia: LOCAL

## 2021-02-13 MED ORDER — LABETALOL HCL 5 MG/ML IV SOLN: 10.0000 mg | INTRAVENOUS | Status: DC | PRN

## 2021-02-13 MED ORDER — HEPARIN SODIUM (PORCINE) 1000 UNIT/ML IJ SOLN
INTRAMUSCULAR | Status: AC
  Filled 2021-02-13: qty 1

## 2021-02-13 MED ORDER — IOHEXOL 350 MG/ML SOLN
INTRAVENOUS | Status: DC | PRN
  Administered 2021-02-13: 40 mL via INTRA_ARTERIAL

## 2021-02-13 MED ORDER — SODIUM CHLORIDE 0.9 % IV SOLN: 250.0000 mL | INTRAVENOUS | Status: DC | PRN

## 2021-02-13 MED ORDER — SODIUM CHLORIDE 0.9 % IV BOLUS: 250.0000 mL | Freq: Once | INTRAVENOUS | Status: DC

## 2021-02-13 MED ORDER — SODIUM CHLORIDE 0.9% FLUSH: 3.0000 mL | Freq: Two times a day (BID) | INTRAVENOUS | Status: DC

## 2021-02-13 MED ORDER — MIDAZOLAM HCL 2 MG/2ML IJ SOLN
INTRAMUSCULAR | Status: AC
  Filled 2021-02-13: qty 2

## 2021-02-13 MED ORDER — LIDOCAINE HCL (PF) 1 % IJ SOLN
INTRAMUSCULAR | Status: DC | PRN
  Administered 2021-02-13: 5 mL via INTRADERMAL

## 2021-02-13 MED ORDER — SODIUM CHLORIDE 0.9 % IV SOLN: INTRAVENOUS | Status: AC

## 2021-02-13 MED ORDER — MIDAZOLAM HCL 2 MG/2ML IJ SOLN
INTRAMUSCULAR | Status: DC | PRN
  Administered 2021-02-13 (×2): 1 mg via INTRAVENOUS

## 2021-02-13 MED ORDER — LIDOCAINE HCL (PF) 1 % IJ SOLN
INTRAMUSCULAR | Status: AC
  Filled 2021-02-13: qty 30

## 2021-02-13 MED ORDER — ASPIRIN 81 MG PO CHEW: 81.0000 mg | CHEWABLE_TABLET | ORAL | Status: DC

## 2021-02-13 MED ORDER — ONDANSETRON HCL 4 MG/2ML IJ SOLN: 4.0000 mg | Freq: Four times a day (QID) | INTRAMUSCULAR | Status: DC | PRN

## 2021-02-13 MED ORDER — HEPARIN (PORCINE) IN NACL 1000-0.9 UT/500ML-% IV SOLN
INTRAVENOUS | Status: DC | PRN
  Administered 2021-02-13 (×2): 500 mL

## 2021-02-13 MED ORDER — HEPARIN (PORCINE) IN NACL 1000-0.9 UT/500ML-% IV SOLN
INTRAVENOUS | Status: AC
  Filled 2021-02-13: qty 1000

## 2021-02-13 MED ORDER — HYDRALAZINE HCL 20 MG/ML IJ SOLN: 10.0000 mg | INTRAMUSCULAR | Status: DC | PRN

## 2021-02-13 MED ORDER — SODIUM CHLORIDE 0.9% FLUSH: 3.0000 mL | INTRAVENOUS | Status: DC | PRN

## 2021-02-13 MED ORDER — VERAPAMIL HCL 2.5 MG/ML IV SOLN
INTRAVENOUS | Status: DC | PRN
  Administered 2021-02-13: 10 mL via INTRA_ARTERIAL

## 2021-02-13 MED ORDER — FENTANYL CITRATE (PF) 100 MCG/2ML IJ SOLN
INTRAMUSCULAR | Status: AC
  Filled 2021-02-13: qty 2

## 2021-02-13 MED ORDER — ACETAMINOPHEN 325 MG PO TABS: 650.0000 mg | ORAL_TABLET | ORAL | Status: DC | PRN

## 2021-02-13 MED ORDER — FENTANYL CITRATE (PF) 100 MCG/2ML IJ SOLN
INTRAMUSCULAR | Status: DC | PRN
  Administered 2021-02-13 (×2): 25 ug via INTRAVENOUS

## 2021-02-13 MED ORDER — HEPARIN SODIUM (PORCINE) 1000 UNIT/ML IJ SOLN
INTRAMUSCULAR | Status: DC | PRN
  Administered 2021-02-13: 4000 [IU] via INTRAVENOUS

## 2021-02-13 MED ORDER — SODIUM CHLORIDE 0.9 % WEIGHT BASED INFUSION
3.0000 mL/kg/h | INTRAVENOUS | Status: AC
  Administered 2021-02-13: 3 mL/kg/h via INTRAVENOUS

## 2021-02-13 MED ORDER — VERAPAMIL HCL 2.5 MG/ML IV SOLN
INTRAVENOUS | Status: AC
  Filled 2021-02-13: qty 2

## 2021-02-13 MED ORDER — SODIUM CHLORIDE 0.9 % WEIGHT BASED INFUSION: 1.0000 mL/kg/h | INTRAVENOUS | Status: DC

## 2021-02-13 SURGICAL SUPPLY — 11 items
CATH 5FR JL3.5 JR4 ANG PIG MP (CATHETERS) ×1 IMPLANT
CATH BALLN WEDGE 5F 110CM (CATHETERS) ×2 IMPLANT
DEVICE RAD COMP TR BAND LRG (VASCULAR PRODUCTS) ×1 IMPLANT
GLIDESHEATH SLEND SS 6F .021 (SHEATH) ×1 IMPLANT
GUIDEWIRE INQWIRE 1.5J.035X260 (WIRE) IMPLANT
INQWIRE 1.5J .035X260CM (WIRE) ×2
KIT HEART LEFT (KITS) ×2 IMPLANT
PACK CARDIAC CATHETERIZATION (CUSTOM PROCEDURE TRAY) ×2 IMPLANT
SHEATH GLIDE SLENDER 4/5FR (SHEATH) ×1 IMPLANT
TRANSDUCER W/STOPCOCK (MISCELLANEOUS) ×2 IMPLANT
TUBING CIL FLEX 10 FLL-RA (TUBING) ×2 IMPLANT

## 2021-02-13 NOTE — Interval H&P Note (Signed)
History and Physical Interval Note:  02/13/2021 8:13 AM  Nicholas Sutton  has presented today for surgery, with the diagnosis of aortic stenosis.  The various methods of treatment have been discussed with the patient and family. After consideration of risks, benefits and other options for treatment, the patient has consented to  Procedure(s): RIGHT/LEFT HEART CATH AND CORONARY ANGIOGRAPHY (N/A) as a surgical intervention.  The patient's history has been reviewed, patient examined, no change in status, stable for surgery.  I have reviewed the patient's chart and labs.  Questions were answered to the patient's satisfaction.    Cath Lab Visit (complete for each Cath Lab visit)  Clinical Evaluation Leading to the Procedure:   ACS: No.  Non-ACS:    Anginal Classification: No Symptoms  Anti-ischemic medical therapy: Minimal Therapy (1 class of medications)  Non-Invasive Test Results: No non-invasive testing performed  Prior CABG: No previous CABG        Lauree Chandler

## 2021-02-13 NOTE — Discharge Instructions (Signed)
Resume Eliquis tomorrow 02/14/21 if no bleeding from right arm cath site.

## 2021-02-13 NOTE — Progress Notes (Signed)
Discharge instructions reviewed with pt and his girl friend both voice understanding.

## 2021-02-13 NOTE — Progress Notes (Signed)
Called and spoke with Ria Comment NP with update of BP, 250 ns bolus was given,

## 2021-02-14 ENCOUNTER — Encounter (HOSPITAL_COMMUNITY): Payer: Self-pay | Admitting: Cardiovascular Disease

## 2021-02-19 ENCOUNTER — Ambulatory Visit (HOSPITAL_COMMUNITY)
Admission: RE | Admit: 2021-02-19 | Discharge: 2021-02-19 | Disposition: A | Discharge status: Home / Self Care | Payer: Medicare Other | Source: Ambulatory Visit | Attending: Cardiovascular Disease | Admitting: Cardiovascular Disease

## 2021-02-19 ENCOUNTER — Other Ambulatory Visit: Payer: Self-pay

## 2021-02-19 ENCOUNTER — Encounter (HOSPITAL_COMMUNITY): Payer: Self-pay

## 2021-02-19 DIAGNOSIS — I771 Stricture of artery: Secondary | ICD-10-CM | POA: Diagnosis not present

## 2021-02-19 DIAGNOSIS — Z01818 Encounter for other preprocedural examination: Secondary | ICD-10-CM | POA: Diagnosis not present

## 2021-02-19 DIAGNOSIS — I350 Nonrheumatic aortic (valve) stenosis: Secondary | ICD-10-CM

## 2021-02-19 DIAGNOSIS — I517 Cardiomegaly: Secondary | ICD-10-CM | POA: Diagnosis not present

## 2021-02-19 DIAGNOSIS — I2510 Atherosclerotic heart disease of native coronary artery without angina pectoris: Secondary | ICD-10-CM | POA: Diagnosis not present

## 2021-02-19 DIAGNOSIS — I358 Other nonrheumatic aortic valve disorders: Secondary | ICD-10-CM | POA: Diagnosis not present

## 2021-02-19 DIAGNOSIS — I714 Abdominal aortic aneurysm, without rupture: Secondary | ICD-10-CM | POA: Diagnosis not present

## 2021-02-19 MED ORDER — IOHEXOL 350 MG/ML SOLN
100.0000 mL | Freq: Once | INTRAVENOUS | Status: AC | PRN
  Administered 2021-02-19: 100 mL via INTRAVENOUS

## 2021-03-15 ENCOUNTER — Encounter: Payer: Self-pay | Admitting: Physician Assistant

## 2021-03-15 ENCOUNTER — Ambulatory Visit: Payer: Medicare Other | Admitting: Cardiovascular Disease

## 2021-03-20 ENCOUNTER — Other Ambulatory Visit: Payer: Self-pay

## 2021-03-20 ENCOUNTER — Encounter: Payer: Self-pay | Admitting: Surgery

## 2021-03-20 ENCOUNTER — Institutional Professional Consult (permissible substitution) (INDEPENDENT_AMBULATORY_CARE_PROVIDER_SITE_OTHER): Payer: Medicare Other | Admitting: Surgery

## 2021-03-20 VITALS — BP 123/87 | HR 84 | Resp 20 | Ht 72.0 in | Wt 165.0 lb

## 2021-03-20 DIAGNOSIS — I350 Nonrheumatic aortic (valve) stenosis: Secondary | ICD-10-CM

## 2021-03-20 DIAGNOSIS — I2510 Atherosclerotic heart disease of native coronary artery without angina pectoris: Secondary | ICD-10-CM

## 2021-03-20 NOTE — Progress Notes (Signed)
Patient ID: [name], male   DOB: 09/06/1945, 75 y.o.   MRN: [id number]  [facility]         [address]       [place] [address]             [phone]          CARDIOTHORACIC SURGERY CONSULTATION REPORT  PCP is Redmond School, MD Referring Provider is Lauree Chandler, MD Primary Cardiologist is Lauree Chandler, MD  Reason for consultation:  Severe aortic stenosis  HPI:  The patient is a 75 year old gentleman with a history of persistent atrial fibrillation on Eliquis, nonischemic cardiomyopathy, remote tobacco abuse, and aortic stenosis who was referred for evaluation for a TAVR.  He had an echocardiogram in January 2019 showing an ejection fraction of 35 to 40% with moderate aortic stenosis.  Cardiac catheterization at that time showed mild coronary disease with normal ejection fraction.  A follow-up echocardiogram in August 2020 showed a mean gradient of 28.5 mmHg and a valve area of 0.77 cm and a dimensionless index was 0.24.  He remained asymptomatic at that time.  He had a repeat echocardiogram in August 2021 showing a mean gradient of 28.7 mmHg across aortic valve with a peak gradient of 50 mmHg.  Aortic valve area was 0.7 cm with a dimensionless index of 0.26.  Left ventricular ejection fraction was 60 to 65%.  He remained asymptomatic.  His most recent echocardiogram on 01/23/2021 showed an increase in the mean gradient to 35 mmHg with a peak gradient of 56 mmHg.  Aortic valve area was 0.65 cm with a dimensionless index of 0.25.  He now has progressive exertional fatigue and shortness of breath over the past 3 to 6 months.  He denies any chest pain or pressure.  He has had no dizziness or syncope.  He denies peripheral edema.  He has had no orthopnea.  He is here today with his significant other.  He is retired.  He reports having bilateral inguinal hernias that require repair and has been  seen by Dr. Georgette Dover.  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 by Dr Excell Seltzer   Headache(784.0)    x 3 - 4 per year   Nonischemic cardiomyopathy ([place])    Severe aortic stenosis     Past Surgical History:  Procedure Laterality Date   APPENDECTOMY     COLON RESECTION     COLOSTOMY REVISION N/A 07/06/2013   Procedure: COLON RESECTION SIGMOID;  Surgeon: Edward Jolly, MD;  Location: [facility];  Service: General;  Laterality: N/A;   FISTULOTOMY N/A 07/06/2013   Procedure: repair of colovesicle fistula/ FISTULOTOMY;  Surgeon: Edward Jolly, MD;  Location: [facility];  Service: General;  Laterality: N/A;   GUM SURGERY     HERNIA REPAIR     INGUINAL HERNIA REPAIR Left 10/10/2015   Procedure: LEFT INGUINAL HERNIA REPAIR ;  Surgeon: Donnie Mesa, MD;  Location: [facility];  Service: General;  Laterality: Left;   INSERTION OF MESH Left 10/10/2015   Procedure: INSERTION OF MESH;  Surgeon: Donnie Mesa, MD;  Location: [facility];  Service: General;  Laterality: Left;   RIGHT/LEFT HEART CATH AND CORONARY ANGIOGRAPHY N/A 10/09/2016   Procedure: Right/Left  Heart Cath and Coronary Angiography;  Surgeon: Nelva Bush, MD;  Location: [facility];  Service: Cardiovascular;  Laterality: N/A;   RIGHT/LEFT HEART CATH AND CORONARY ANGIOGRAPHY N/A 02/13/2021   Procedure: RIGHT/LEFT HEART CATH AND CORONARY ANGIOGRAPHY;  Surgeon: Burnell Blanks, MD;  Location: [facility];  Service: Cardiovascular;  Laterality: N/A;    Family History  Problem Relation Age of Onset   Stroke Mother 50   Heart attack Father 60   Hypertension Unknown     Social History   Socioeconomic History   Marital status: Divorced    Spouse name: Not on file   Number of children: Not on file   Years of education: Not on file    Highest education level: Not on file  Occupational History   Not on file  Tobacco Use   Smoking status: Former    Packs/day: 1.50    Years: 30.00    Pack years: 45.00    Types: Cigarettes    Quit date: 06/03/2008    Years since quitting: 12.8   Smokeless tobacco: Never  Vaping Use   Vaping Use: Never used  Substance and Sexual Activity   Alcohol use: No    Comment: rare   Drug use: No   Sexual activity: Never    Birth control/protection: None  Other Topics Concern   Not on file  Social History Narrative   Not on file   Social Determinants of Health   Financial Resource Strain: Not on file  Food Insecurity: Not on file  Transportation Needs: Not on file  Physical Activity: Not on file  Stress: Not on file  Social Connections: Not on file  Intimate Partner Violence: Not on file    Prior to Admission medications   Medication Sig Start Date End Date Taking? Authorizing Provider  acetaminophen (TYLENOL) 500 MG tablet Take 1,000 mg by mouth every 6 (six) hours as needed for headache.   Yes [provider]  Bioflavonoid Products (ESTER-C) 500-550 MG TABS Take 1 tablet by mouth in the morning.   Yes [provider]  carvedilol (COREG) 3.125 MG tablet Take 1 tablet (3.125 mg total) by mouth 2 (two) times daily. 12/19/20  Yes Burnell Blanks, MD  cholecalciferol (VITAMIN D) 25 MCG (1000 UNIT) tablet Take 1,000 Units by mouth in the morning and at bedtime.   Yes [provider]  ELIQUIS 5 MG TABS tablet TAKE 1 TABLET(5 MG) BY MOUTH TWICE DAILY 12/19/20  Yes Burnell Blanks, MD  loratadine (CLARITIN) 10 MG tablet Take 10 mg by mouth daily as needed for allergies.   Yes [provider]  losartan (COZAAR) 25 MG tablet TAKE 1 TABLET(25 MG) BY MOUTH DAILY 09/20/20  Yes Burnell Blanks, MD  Multiple Vitamins-Minerals (PRESERVISION AREDS 2 PO) Take 1 tablet by mouth in the morning and at bedtime.   Yes [provider]  Omega-3  Fatty Acids (FISH OIL) 1000 MG CAPS Take 1,000 mg by mouth in the morning and at bedtime.   Yes [provider]  tadalafil (CIALIS) 5 MG tablet Take 5 mg by mouth daily as needed for erectile dysfunction. 02/03/19  Yes [provider]  vitamin C (ASCORBIC ACID) 500 MG tablet Take 500 mg by mouth every evening.   Yes [provider]    Current Outpatient Medications  Medication Sig Dispense Refill   acetaminophen (TYLENOL) 500 MG tablet Take 1,000 mg by mouth every 6 (six) hours as needed for headache.  Bioflavonoid Products (ESTER-C) 500-550 MG TABS Take 1 tablet by mouth in the morning.     carvedilol (COREG) 3.125 MG tablet Take 1 tablet (3.125 mg total) by mouth 2 (two) times daily. 180 tablet 2   cholecalciferol (VITAMIN D) 25 MCG (1000 UNIT) tablet Take 1,000 Units by mouth in the morning and at bedtime.     ELIQUIS 5 MG TABS tablet TAKE 1 TABLET(5 MG) BY MOUTH TWICE DAILY 180 tablet 1   loratadine (CLARITIN) 10 MG tablet Take 10 mg by mouth daily as needed for allergies.     losartan (COZAAR) 25 MG tablet TAKE 1 TABLET(25 MG) BY MOUTH DAILY 90 tablet 3   Multiple Vitamins-Minerals (PRESERVISION AREDS 2 PO) Take 1 tablet by mouth in the morning and at bedtime.     Omega-3 Fatty Acids (FISH OIL) 1000 MG CAPS Take 1,000 mg by mouth in the morning and at bedtime.     tadalafil (CIALIS) 5 MG tablet Take 5 mg by mouth daily as needed for erectile dysfunction.     vitamin C (ASCORBIC ACID) 500 MG tablet Take 500 mg by mouth every evening.     No current facility-administered medications for this visit.    Allergies  Allergen Reactions   Dust Mite Extract Other (See Comments)    Sneezing   Pollen Extract Other (See Comments)    Sneezing      Review of Systems:   General:  normal appetite, + decreased energy, no weight gain, no weight loss, no fever  Cardiac:  no chest pain with exertion, no chest pain at rest, +SOB with moderate exertion, no resting SOB,  no PND, no orthopnea, no palpitations, + arrhythmia, + atrial fibrillation, no LE edema, no dizzy spells, no syncope  Respiratory:  + exertional shortness of breath, no home oxygen, no productive cough, no dry cough, no bronchitis, no wheezing, no hemoptysis, no asthma, no pain with inspiration or cough, no sleep apnea, no CPAP at night  GI:   no difficulty swallowing, no reflux,no frequent heartburn, no hiatal hernia, no abdominal pain, no constipation, no diarrhea, no hematochezia, no hematemesis, no melena  GU:   no dysuria,  no frequency, no urinary tract infection, no hematuria, no enlarged prostate, no kidney stones, no kidney disease  Vascular:  no pain suggestive of claudication, no pain in feet, no leg cramps, no varicose veins, no DVT, no non-healing foot ulcer  Neuro:   no stroke, no TIA's, no seizures, no headaches, no temporary blindness one eye,  no slurred speech, no peripheral neuropathy, no chronic pain, no instability of gait, no memory/cognitive dysfunction  Musculoskeletal: no arthritis, no joint swelling, no myalgias, no difficulty walking, no mobility   Skin:   no rash, no itching, no skin infections, no pressure sores or ulcerations  Psych:   no anxiety, no depression, no nervousness, no unusual recent stress  Eyes:   no blurry vision, no floaters, no recent vision changes, + wears glasses  ENT:   mp hearing loss, mp loose or painful teeth, mp dentures, last saw dentist this year  Hematologic:  no easy bruising, no abnormal bleeding, no clotting disorder, no frequent epistaxis  Endocrine:  no diabetes, does not check CBG's at home     Physical Exam:   BP 123/87   Pulse 84   Resp 20   Ht 6' (1.829 m)   Wt 165 lb (74.8 kg)   SpO2 95%   BMI 22.38 kg/m   General:   well-appearing  HEENT:  Unremarkable, NCAT, PERLA, EOMI  Neck:   no JVD, no bruits, no adenopathy   Chest:   clear to auscultation, symmetrical breath sounds, no wheezes, no rhonchi   CV:   RRR, 3/6 systolic  murmur RSB  Abdomen:  soft, non-tender, no masses   Extremities:  warm, well-perfused, pulses palpable at ankle, no lower extremity edema  Rectal/GU  Deferred  Neuro:   Grossly non-focal and symmetrical throughout  Skin:   Clean and dry, no rashes, no breakdown  Diagnostic Tests:  Patient Name:   ROURKE KASE Date of Exam: 01/23/2021  Medical Rec #:  [id number]        Height:       72.0 in  Accession #:    [id number]       Weight:       164.6 lb  Date of Birth:  [birth year]       BSA:          1.961 m  Patient Age:    31 years         BP:           99/67 mmHg  Patient Gender: M                HR:           80 bpm.  Exam Location:  [facility]   Procedure: 2D Echo, Cardiac Doppler and Color Doppler   Indications:    I35.0 (ICD-10-CM) - Severe aortic stenosis     History:        Patient has prior history of Echocardiogram examinations,  most                  recent 08/16/2020. Aortic Valve Disease, Arrythmias:Atrial                  Fibrillation; Signs/Symptoms:Murmur. NICM (nonischemic                  cardiomyopathy)(From Hx).     Sonographer:    Alvino Chapel RCS  Referring Phys: [place]     1. Left ventricular ejection fraction, by estimation, is 60 to 65%. The  left ventricle has normal function. The left ventricle has no regional  wall motion abnormalities. Left ventricular diastolic parameters are  indeterminate.   2. Right ventricular systolic function is normal. The right ventricular  size is normal. There is normal pulmonary artery systolic pressure.   3. Left atrial size was mildly dilated.   4. Right atrial size was moderately dilated.   5. The mitral valve is abnormal. No evidence of mitral valve  regurgitation. No evidence of mitral stenosis.   6. Since TTE done 08/16/20 mean gradient increased from 28.7 to 35.3 mmHg  and peak from 49.5 to 56 mmHg            DVI 0.25 and AVA 0.65 cm2 consistent wtih severe AS Fused right and   left cusps. The aortic valve is calcified. Aortic valve regurgitation is  mild. Severe aortic valve stenosis.   7. The inferior vena cava is normal in size with greater than 50%  respiratory variability, suggesting right atrial pressure of 3 mmHg.   FINDINGS   Left Ventricle: Left ventricular ejection fraction, by estimation, is 60  to 65%. The left ventricle has normal function. The left ventricle has no  regional wall motion abnormalities. The left ventricular internal cavity  size was normal in size.  There is   no left ventricular hypertrophy. Left ventricular diastolic parameters  are indeterminate.   Right Ventricle: The right ventricular size is normal. No increase in  right ventricular wall thickness. Right ventricular systolic function is  normal. There is normal pulmonary artery systolic pressure. The tricuspid  regurgitant velocity is 2.65 m/s, and   with an assumed right atrial pressure of 3 mmHg, the estimated right  ventricular systolic pressure is 99991111 mmHg.   Left Atrium: Left atrial size was mildly dilated.   Right Atrium: Right atrial size was moderately dilated.   Pericardium: There is no evidence of pericardial effusion.   Mitral Valve: The mitral valve is abnormal. There is mild thickening of  the mitral valve leaflet(s). There is mild calcification of the mitral  valve leaflet(s). Mild mitral annular calcification. No evidence of mitral  valve regurgitation. No evidence of  mitral valve stenosis.   Tricuspid Valve: The tricuspid valve is normal in structure. Tricuspid  valve regurgitation is mild . No evidence of tricuspid stenosis.   Aortic Valve: Since TTE done 08/16/20 mean gradient increased from 28.7 to  35.3 mmHg and peak from 49.5 to 56 mmHg   DVI 0.25 and AVA 0.65 cm2 consistent wtih severe AS Fused right and left  cusps. The aortic valve is calcified. Aortic valve regurgitation is mild.  Aortic regurgitation PHT measures 379 msec. Severe aortic  stenosis is  present. Aortic valve mean gradient  measures 35.3 mmHg. Aortic valve peak gradient measures 56.1 mmHg. Aortic  valve area, by VTI measures 0.64 cm.   Pulmonic Valve: The pulmonic valve was normal in structure. Pulmonic valve  regurgitation is not visualized. No evidence of pulmonic stenosis.   Aorta: The aortic root is normal in size and structure.   Venous: The inferior vena cava is normal in size with greater than 50%  respiratory variability, suggesting right atrial pressure of 3 mmHg.   IAS/Shunts: No atrial level shunt detected by color flow Doppler.      LEFT VENTRICLE  PLAX 2D  LVIDd:         4.00 cm  LVIDs:         2.00 cm  LV PW:         0.90 cm  LV IVS:        0.90 cm  LVOT diam:     1.80 cm  LV SV:         56  LV SV Index:   28  LVOT Area:     2.54 cm      RIGHT VENTRICLE  RV S prime:     13.10 cm/s  TAPSE (M-mode): 2.2 cm   LEFT ATRIUM             Index       RIGHT ATRIUM           Index  LA diam:        3.90 cm 1.99 cm/m  RA Area:     22.40 cm  LA Vol (A2C):   83.0 ml 42.32 ml/m RA Volume:   61.40 ml  31.31 ml/m  LA Vol (A4C):   66.7 ml 34.01 ml/m  LA Biplane Vol: 77.2 ml 39.36 ml/m   AORTIC VALVE  AV Area (Vmax):    0.65 cm  AV Area (Vmean):   0.62 cm  AV Area (VTI):     0.64 cm  AV Vmax:           374.33 cm/s  AV Vmean:          278.000 cm/s  AV VTI:            0.862 m  AV Peak Grad:      56.1 mmHg  AV Mean Grad:      35.3 mmHg  LVOT Vmax:         95.95 cm/s  LVOT Vmean:        67.950 cm/s  LVOT VTI:          0.218 m  LVOT/AV VTI ratio: 0.25  AI PHT:            379 msec     AORTA  Ao Root diam: 3.40 cm   MITRAL VALVE              TRICUSPID VALVE  MV Area (PHT): 3.38 cm   TR Peak grad:   28.1 mmHg  MV Decel Time: 225 msec   TR Vmax:        265.00 cm/s  MV E velocity: 1.10 cm/s                            SHUNTS                            Systemic VTI:  0.22 m                            Systemic Diam: 1.80 cm   Jenkins Rouge MD  Electronically signed by Jenkins Rouge MD  Signature Date/Time: 01/23/2021/4:03:16 PM      Physicians  Panel Physicians Referring Physician Case Authorizing Physician  Burnell Blanks, MD (Primary)     Procedures  RIGHT/LEFT HEART CATH AND CORONARY ANGIOGRAPHY   Conclusion      Ost 1st Diag to 1st Diag lesion is 20% stenosed.   Dist LAD lesion is 20% stenosed.   Ost Cx to Mid Cx lesion is 20% stenosed.   Mid LAD lesion is 20% stenosed.   Mild non-obstructive CAD Severe aortic stenosis by echo. Cath data: peak to peak gradient 31 mmHg, mean gradient 30.4 mmHg).    Recommendations: Will continue workup for TAVR.    Indications  Nonrheumatic aortic valve stenosis [I35.0 (ICD-10-CM)]   Procedural Details  Technical Details Indication: Aortic stenosis, workup for TAVR  Procedure: The risks, benefits, complications, treatment options, and expected outcomes were discussed with the patient. The patient and/or family concurred with the proposed plan, giving informed consent. The patient was brought to the cath lab after IV hydration was given. The patient was sedated with Versed and Fentanyl. The IV catheter present in the right antecubital vein was changed for a 5 [place] sheath. Right heart catheterization performed with a balloon tipped catheter. The right wrist was prepped and draped in a sterile fashion. 1% lidocaine was used for local anesthesia. Using the modified Seldinger access technique, a 5 French sheath was placed in the right radial artery. 3 mg Verapamil was given through the sheath. 4000 units IV heparin was given. Standard diagnostic catheters were used to perform selective coronary angiography. I crossed the aortic valve with the JR4 catheter and a J wire. The sheath was removed from the right radial artery and a Terumo hemostasis band was applied at the arteriotomy site on the right wrist.      Estimated blood loss <  50 mL.   During this procedure  medications were administered to achieve and maintain moderate conscious sedation while the patient's heart rate, blood pressure, and oxygen saturation were continuously monitored and I was present face-to-face 100% of this time.   Medications (Filter: Administrations occurring from 0935 to 1037 on 02/13/21)  important  Continuous medications are totaled by the amount administered until 02/13/21 1037.   fentaNYL (SUBLIMAZE) injection (mcg) Total dose:  50 mcg Date/Time Rate/Dose/Volume Action   02/13/21 0948 25 mcg Given   1010 25 mcg Given    midazolam (VERSED) injection (mg) Total dose:  2 mg Date/Time Rate/Dose/Volume Action   02/13/21 0949 1 mg Given   1010 1 mg Given    lidocaine (PF) (XYLOCAINE) 1 % injection (mL) Total volume:  5 mL Date/Time Rate/Dose/Volume Action   02/13/21 1005 5 mL Given    Radial Cocktail/Verapamil only (mL) Total volume:  10 mL Date/Time Rate/Dose/Volume Action   02/13/21 1011 10 mL Given    heparin sodium (porcine) injection (Units) Total dose:  4,000 Units Date/Time Rate/Dose/Volume Action   02/13/21 1016 4,000 Units Given    Heparin (Porcine) in NaCl 1000-0.9 UT/500ML-% SOLN (mL) Total volume:  1,000 mL Date/Time Rate/Dose/Volume Action   02/13/21 1032 500 mL Given   1032 500 mL Given    iohexol (OMNIPAQUE) 350 MG/ML injection (mL) Total volume:  40 mL Date/Time Rate/Dose/Volume Action   02/13/21 1034 40 mL Given    Sedation Time  Sedation Time Physician-1: 35 minutes 21 seconds Contrast  Medication Name Total Dose  iohexol (OMNIPAQUE) 350 MG/ML injection 40 mL   Radiation/Fluoro  Fluoro time: 5 (min) DAP: 6.7 (Gycm2) Cumulative Air Kerma: 130.8 (mGy) Coronary Findings  Diagnostic Dominance: Left Left Main  Vessel is large. Vessel is angiographically normal.  Left Anterior Descending  Mid LAD lesion is 20% stenosed.  Dist LAD lesion is 20% stenosed.  First Diagonal Branch  Vessel is moderate in size.  Ost 1st Diag  to 1st Diag lesion is 20% stenosed. The lesion is eccentric. The lesion is mildly calcified.  Left Circumflex  Vessel is large.  Ost Cx to Mid Cx lesion is 20% stenosed.  First Obtuse Marginal Branch  Vessel is small in size.  Second Obtuse Marginal Branch  Vessel is large in size.  Third Obtuse Marginal Branch  Vessel is small in size.  Fourth Obtuse Marginal Branch  Vessel is small in size.  Right Coronary Artery  Vessel is small. Vessel is angiographically normal.  Intervention  No interventions have been documented. Coronary Diagrams  Diagnostic Dominance: Left Intervention  Implants     No implant documentation for this case.   Syngo Images   Show images for CARDIAC CATHETERIZATION Images on Long Term Storage   Show images for Fritcher, Vicki Bessey "Wes" Link to Procedure Log  Procedure Log    Hemo Data  Flowsheet Row Most Recent Value  Fick Cardiac Output 4.7 L/min  Fick Cardiac Output Index 2.39 (L/min)/BSA  Aortic Mean Gradient 30.4 mmHg  Aortic Peak Gradient 31 mmHg  Aortic Valve Area 1.49  Aortic Value Area Index 0.76 cm2/BSA  RA A Wave 2 mmHg  RA V Wave 2 mmHg  RA Mean 1 mmHg  RV Systolic Pressure 17 mmHg  RV Diastolic Pressure 0 mmHg  RV EDP 1 mmHg  PA Systolic Pressure 20 mmHg  PA Diastolic Pressure 0 mmHg  PA Mean 13 mmHg  PW A Wave 7 mmHg  PW V Wave 8 mmHg  PW  Mean 5 mmHg  AO Systolic Pressure 80 mmHg  AO Diastolic Pressure 54 mmHg  AO Mean 67 mmHg  LV Systolic Pressure 123456 mmHg  LV Diastolic Pressure 2 mmHg  LV EDP 5 mmHg  AOp Systolic Pressure 88 mmHg  AOp Diastolic Pressure 58 mmHg  AOp Mean Pressure 73 mmHg  LVp Systolic Pressure 123456 mmHg  LVp Diastolic Pressure 1 mmHg  LVp EDP Pressure 5 mmHg  QP/QS 1  TPVR Index 5.43 HRUI  TSVR Index 28.01 HRUI  PVR SVR Ratio 0.12  TPVR/TSVR Ratio 0.19    ADDENDUM REPORT: 02/21/2021 13:18   CLINICAL DATA:  Severe Aortic Stenosis.   EXAM: Cardiac TAVR CT   TECHNIQUE: A non-contrast,  gated CT scan was obtained with axial slices of 3 mm through the heart for aortic valve calcium scoring. A 120 kV retrospective, gated, contrast cardiac scan was obtained. Gantry rotation speed was 250 msecs and collimation was 0.6 mm. Nitroglycerin was not given. The 3D data set was reconstructed in 5% intervals of the 0-95% of the R-R cycle. Systolic and diastolic phases were analyzed on a dedicated workstation using MPR, MIP, and VRT modes. The patient received 100 cc of contrast.   FINDINGS: Image quality: Excellent.   Noise artifact is: Limited.   Valve Morphology: The aortic valve is tricuspid with diffuse, severe calcifications. There is bulky calcification of the [place] noted. The leaflets are severely restricted in systole consistent with severe aortic stenosis.   Aortic Valve Calcium score: 1953   Aortic annular dimension:   Phase assessed: 290 ms   Annular area: 405 mm2   Annular perimeter: 72.6 mm   Max diameter: 26.2 mm   Min diameter: 19.8 mm   Annular and subannular calcification: None.   Optimal coplanar projection: LAO 8 CAU 8   Coronary Artery Height above Annulus:   Left Main: 14.1 mm   Right Coronary: 15.9 mm   Sinus of Valsalva Measurements:   Non-coronary: 33 mm   Right-coronary: 33 mm   Left-coronary: 34 mm   Sinus of Valsalva Height:   Non-coronary: 25.6 mm   Right-coronary: 21.6 mm   Left-coronary: 21.3 mm   Sinotubular Junction: 31 mm   Ascending Thoracic Aorta: 34 mm   Coronary Arteries: Normal coronary origin. Left dominance. The study was performed without use of NTG and is insufficient for plaque evaluation. Please refer to recent cardiac catheterization for coronary assessment. Mild coronary calcifications noted.   Cardiac Morphology:   Right Atrium: Right atrial size is dilated.   Right Ventricle: The right ventricular cavity is within normal limits.   Left Atrium: Left atrial size is dilated. There is a filling  defect in the LAA which is consistent with mixing artifact.   Left Ventricle: The ventricular cavity size is within normal limits. There are no stigmata of prior infarction. There is no abnormal filling defect.   Pulmonary arteries: Normal in size without proximal filling defect.   Pulmonary veins: Normal pulmonary venous drainage.   Pericardium: Normal thickness with no significant effusion or calcium present.   Mitral Valve: The mitral valve is normal structure without significant calcification.   Extra-cardiac findings: See attached radiology report for non-cardiac structures.   IMPRESSION: 1. Tricuspid aortic valve with severe calcifications. Bulky calcification of the [place] noted.   2. Annular measurements appropriate for 23 mm S3 (405 mm2). Measurements made at 290 ms due to motion artifact.   3. No significant annular or subannular calcifications.   4. Sufficient coronary to annulus  distance.   5. Optimal Fluoroscopic Angle for Delivery: LAO 8 CAU 8   6. There is a filling defect in the LAA which is consistent with mixing artifact.   [place] Bells T. Audie Box, MD     Electronically Signed   By: Eleonore Chiquito   On: 02/21/2021 13:18    Addended by Geralynn Rile, MD on 02/21/2021  1:20 PM   Study Result  Narrative & Impression  EXAM: OVER-READ INTERPRETATION  CT CHEST   The following report is an over-read performed by radiologist Dr. Vinnie Langton of [facility], [place] on 02/19/2021. This over-read does not include interpretation of cardiac or coronary anatomy or pathology. The coronary calcium score/coronary CTA interpretation by the cardiologist is attached.   COMPARISON:  None.   FINDINGS: Extracardiac findings will be described separately under dictation for contemporaneously obtained CTA chest, abdomen and pelvis.   IMPRESSION: Please see separate dictation for contemporaneously obtained CTA chest, abdomen and pelvis dated 02/19/2021 for  full description of relevant extracardiac findings.   Electronically Signed: By: Vinnie Langton M.D. On: 02/19/2021 10:27    CLINICAL DATA:  75 year old male with history of severe aortic stenosis. Preprocedural study prior to potential transcatheter aortic valve replacement (TAVR) procedure.   EXAM: CT ANGIOGRAPHY CHEST, ABDOMEN AND PELVIS   TECHNIQUE: Multidetector CT imaging through the chest, abdomen and pelvis was performed using the standard protocol during bolus administration of intravenous contrast. Multiplanar reconstructed images and MIPs were obtained and reviewed to evaluate the vascular anatomy.   CONTRAST:  111m OMNIPAQUE IOHEXOL 350 MG/ML SOLN   COMPARISON:  No prior chest CT. CT the abdomen and pelvis 05/09/2013.   FINDINGS: CTA CHEST FINDINGS   Cardiovascular: Heart size is mildly enlarged with right atrial and right ventricular dilatation. There is no significant pericardial fluid, thickening or pericardial calcification. There is aortic atherosclerosis, as well as atherosclerosis of the great vessels of the mediastinum and the coronary arteries, including calcified atherosclerotic plaque in the left main, left anterior descending, left circumflex and right coronary arteries. Severe thickening and calcification of the aortic valve.   Mediastinum/Lymph Nodes: No pathologically enlarged mediastinal or hilar lymph nodes. Esophagus is unremarkable in appearance. No axillary lymphadenopathy.   Lungs/Pleura: No suspicious appearing pulmonary nodules or masses are noted. No acute consolidative airspace disease. No pleural effusions.   Musculoskeletal/Soft Tissues: At T11 there is a new but nonacute compression fracture of the superior endplate with [id number] of anterior vertebral body height. Mild compression deformity also noted in the superior endplate of T5 with [id number] of anterior vertebral body height. There are no aggressive appearing lytic  or blastic lesions noted in the visualized portions of the skeleton.   CTA ABDOMEN AND PELVIS FINDINGS   Hepatobiliary: Multilobular low-attenuation lesion predominantly in segment 2 of the liver (axial image 100 of series 5) measuring 2.0 x 1.4 cm, similar to the prior examination, compatible with a hepatic cyst. No other suspicious hepatic lesions. No intra or extrahepatic biliary ductal dilatation. Gallbladder is normal in appearance.   Pancreas: No pancreatic mass. No pancreatic ductal dilatation. No pancreatic or peripancreatic fluid collections or inflammatory changes.   Spleen: Unremarkable.   Adrenals/Urinary Tract: Bilateral kidneys and bilateral adrenal glands are normal in appearance. No hydroureteronephrosis. Urinary bladder is nearly decompressed, but otherwise unremarkable in appearance.   Stomach/Bowel: The appearance of the stomach is normal. No pathologic dilatation of small bowel or colon. Status post partial colectomy with a suture line near the rectosigmoid junction. The appendix  is not confidently identified and may be surgically absent. Regardless, there are no inflammatory changes noted adjacent to the cecum to suggest the presence of an acute appendicitis at this time.   Vascular/Lymphatic: Fusiform aneurysmal dilatation of the infrarenal abdominal aorta which measures up to 3.7 x 3.0 cm. There is also fusiform ectasia of the distal infrarenal abdominal aorta immediately above the level of the bifurcation measuring up to 2.3 x 2.6 cm. Vascular findings and measurements pertinent to potential TAVR procedure, as detailed below. No lymphadenopathy noted in the abdomen or pelvis.   Reproductive: Prostate gland and seminal vesicles are unremarkable in appearance.   Other: No significant volume of ascites.  No pneumoperitoneum.   Musculoskeletal: There are no aggressive appearing lytic or blastic lesions noted in the visualized portions of the skeleton.    VASCULAR MEASUREMENTS PERTINENT TO TAVR:   AORTA:   Minimal Aortic Diameter-15 x 13 mm   Severity of Aortic Calcification-severe   RIGHT PELVIS:   Right Common Iliac Artery -   Minimal Diameter-6.6 x 6.4 mm   Tortuosity-mild   Calcification-moderate   Right External Iliac Artery -   Minimal Diameter-4.9 x 4.7 mm   Tortuosity - mild   Calcification-minimal   Right Common Femoral Artery -   Minimal Diameter-5.8 x 5.4 mm   Tortuosity - mild   Calcification-mild   LEFT PELVIS:   Left Common Iliac Artery -   Minimal Diameter-6.5 x 5.5 mm   Tortuosity - mild   Calcification-moderate to severe   Left External Iliac Artery -   Minimal Diameter-5.5 x 5.5 mm   Tortuosity - mild   Calcification-none   Left Common Femoral Artery -   Minimal Diameter-4.7 x 6.0 mm   Tortuosity - mild   Calcification-mild   Review of the MIP images confirms the above findings.   IMPRESSION: 1. Vascular findings and measurements pertinent to potential TAVR procedure, as detailed above. 2. Severe thickening calcification of the aortic valve, compatible with reported clinical history of severe aortic stenosis. 3. Cardiomegaly with right atrial and right ventricular dilatation. 4. Aortic atherosclerosis, in addition to left main and 3 vessel coronary artery disease. There is also fusiform aneurysmal dilatation of the infrarenal abdominal aorta which measures up to 3.7 x 3.0 cm (mean diameter of 3.35 cm). Recommend follow-up ultrasound every 3 years. This recommendation follows ACR consensus guidelines: White Paper of the ACR Incidental Findings Committee II on Vascular Findings. J Am Coll Radiol 2013; 10:789-794. 5. Additional incidental findings, as above     Electronically Signed   By: Vinnie Langton M.D.   On: 02/19/2021 12:46    STS Risk Score:  Procedure: AVR + CAB Risk of Mortality: 1.570% Renal Failure: 1.255% Permanent Stroke: 1.334% Prolonged  Ventilation: 4.791% DSW Infection: 0.070% Reoperation: 3.226% Morbidity or Mortality: 7.776% Short Length of Stay: 46.588% Long Length of Stay: 3.705%  Impression:  This 75 year old gentleman has stage D, severe, symptomatic aortic stenosis with New York Heart Association class II symptoms of exertional fatigue and shortness of breath consistent with chronic diastolic congestive heart failure.  I have personally reviewed his 2D echocardiogram, cardiac catheterization, and CTA studies.  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  There is moderate aortic insufficiency with a pressure half-time of 379 ms.  Left ventricular ejection fraction is 60 to 65%.  Cardiac catheterization shows mild obstructive coronary disease.  The mean gradient by cath was 30.4 mmHg and a peak to peak gradient of 31 mmHg.  I think aortic valve replacement is indicated in this patient for relief of his symptoms and to prevent progressive left ventricular deterioration.  Given his age I think that transcatheter aortic valve replacement is the best treatment for him.  His gated cardiac CTA shows anatomy suitable for transcatheter aortic valve replacement using a 23 mm SAPIEN 3 valve.  His abdominal and pelvic CTA shows adequate pelvic vascular anatomy to allow transfemoral insertion.  The patient was counseled at length regarding treatment alternatives for management of severe symptomatic aortic stenosis. The risks and benefits of surgical intervention has been discussed in detail. Long-term prognosis with medical therapy was discussed. Alternative approaches such as conventional surgical aortic valve replacement, transcatheter aortic valve replacement, and palliative medical therapy were compared and  contrasted at length. This discussion was placed in the context of the patient's own specific clinical presentation and past medical history. All of his questions have been addressed.   Following the decision to proceed with transcatheter aortic valve replacement, a discussion was held regarding what types of management strategies would be attempted intraoperatively in the event of life-threatening complications, including whether or not the patient would be considered a candidate for the use of cardiopulmonary bypass and/or conversion to open sternotomy for attempted surgical intervention.  I think he is a candidate for emergent sternotomy to manage any intraoperative complications.The patient is aware of the fact that transient use of cardiopulmonary bypass may be necessary. The patient has been advised of a variety of complications that might develop including but not limited to risks of death, stroke, paravalvular leak, aortic dissection or other major vascular complications, aortic annulus rupture, device embolization, cardiac rupture or perforation, mitral regurgitation, acute myocardial infarction, arrhythmia, heart block or bradycardia requiring permanent pacemaker placement, congestive heart failure, respiratory failure, renal failure, pneumonia, infection, other late complications related to structural valve deterioration or migration, or other complications that might ultimately cause a temporary or permanent loss of functional independence or other long term morbidity. The patient provides full informed consent for the procedure as described and all questions were answered.      Plan:  He will be scheduled for transfemoral transcatheter aortic valve replacement using a SAPIEN 3 valve on Tuesday, 04/02/2021.  I spent 60 minutes performing this consultation and > 50% of this time was spent face to face counseling and coordinating the care of this patient's severe symptomatic aortic  stenosis.   Gaye Pollack, MD 03/20/2021

## 2021-03-26 ENCOUNTER — Other Ambulatory Visit: Payer: Self-pay

## 2021-03-26 ENCOUNTER — Other Ambulatory Visit: Payer: Self-pay | Admitting: Physician Assistant

## 2021-03-26 DIAGNOSIS — I350 Nonrheumatic aortic (valve) stenosis: Secondary | ICD-10-CM

## 2021-03-28 ENCOUNTER — Other Ambulatory Visit: Payer: Self-pay

## 2021-03-28 DIAGNOSIS — I350 Nonrheumatic aortic (valve) stenosis: Secondary | ICD-10-CM

## 2021-03-28 NOTE — Progress Notes (Signed)
Surgical Instructions   Your procedure is scheduled on Tuesday, 04/02/2021.  Report to [facility] Main Entrance "A" at 07:15 A.M., then check in with the Admitting office.  Call [phone] if you have problems or questions between now and the morning of surgery:   Remember: Do not eat and drink after midnight the night before your surgery  Home medications to take: Take your Eliquis 03/28/2021 then STOP on 03/29/2021. Take your other medications as directed throughout the day before surgery. Do not take any medications the morning of surgery.           Do not wear jewelry Do not wear lotions, powders, colognes, or deodorant. Do not shave 48 hours prior to surgery.  Men may shave face and neck. Do not bring valuables to the hospital - [facility] is not responsible for any belongings or valuables.   Do NOT Smoke (Tobacco/Vaping) or drink Alcohol 24 hours prior to your procedure  If you use a CPAP at night, you may bring all equipment for your overnight stay.   Contacts, glasses, hearing aids, dentures or partials may not be worn into surgery, please bring cases for these belongings   For patients admitted to the hospital, discharge time will be determined by your treatment team.   Patients discharged the day of surgery will not be allowed to drive home, and someone needs to stay with them for 24 hours.  ONLY ONE (1) SUPPORT PERSON MAY WAIT IN THE WAITING AREA WHILE YOU ARE IN SURGERY. NO VISITORS WILL BE ALLOWED IN PRE-OP WHERE PATIENTS GET READY FOR SURGERY.  TWO (2) VISITORS WILL BE ALLOWED IN YOUR ROOM IF YOU ARE ADMITTED AFTER SURGERY.  Minor children may have two parents present. Special consideration for safety and communication needs will be reviewed on a case by case basis.  Special instructions:    Oral Hygiene is also important to reduce your risk of infection.  Remember - BRUSH YOUR TEETH THE MORNING OF SURGERY WITH YOUR REGULAR TOOTHPASTE   [facility]- Preparing For  Surgery  Before surgery, you can play an important role. Because skin is not sterile, your skin needs to be as free of germs as possible. You can reduce the number of germs on your skin by washing with CHG (chlorahexidine gluconate) Soap before surgery.  CHG is an antiseptic cleaner which kills germs and bonds with the skin to continue killing germs even after washing.     Please do not use if you have an allergy to CHG or antibacterial soaps. If your skin becomes reddened/irritated stop using the CHG.  Do not shave (including legs and underarms) for at least 48 hours prior to first CHG shower. It is OK to shave your face.  Please follow these instructions carefully.     Shower the NIGHT BEFORE SURGERY and the MORNING OF SURGERY with CHG Soap.   If you chose to wash your hair, wash your hair first as usual with your normal shampoo. After you shampoo, rinse your hair and body thoroughly to remove the shampoo.    Then [employer] and genitals (private parts) with your normal soap and rinse thoroughly to remove soap.  Next use the CHG Soap as you would any other liquid soap. You can apply CHG directly to the skin and wash gently with a clean washcloth.   Apply the CHG Soap to your body ONLY FROM THE NECK DOWN.  Do not use on open wounds or open sores. Avoid contact with your  eyes, ears, mouth and genitals (private parts). Wash Face and genitals (private parts)  with your normal soap.   Wash thoroughly, paying special attention to the area where your surgery will be performed.  Thoroughly rinse your body with warm water from the neck down.  DO NOT shower/wash with your normal soap after using and rinsing off the CHG Soap.  Pat yourself dry with a CLEAN TOWEL.  Wear CLEAN PAJAMAS to bed the night before surgery  Place CLEAN SHEETS on your bed the night before your surgery  DO NOT SLEEP WITH PETS.   Day of Surgery:  Take a shower with CHG soap. Wear Clean/Comfortable clothing the  morning of surgery Do not apply any deodorants/lotions.   Remember to brush your teeth WITH YOUR REGULAR TOOTHPASTE.   Please read over the following fact sheets that you were given.

## 2021-03-29 ENCOUNTER — Ambulatory Visit (HOSPITAL_COMMUNITY)
Admission: RE | Admit: 2021-03-29 | Discharge: 2021-03-29 | Disposition: A | Discharge status: Home / Self Care | Payer: Medicare Other | Source: Ambulatory Visit | Attending: Cardiovascular Disease | Admitting: Cardiovascular Disease

## 2021-03-29 ENCOUNTER — Encounter (HOSPITAL_COMMUNITY): Payer: Self-pay

## 2021-03-29 ENCOUNTER — Ambulatory Visit: Payer: Medicare Other | Attending: Physician Assistant

## 2021-03-29 ENCOUNTER — Other Ambulatory Visit: Payer: Self-pay

## 2021-03-29 ENCOUNTER — Encounter (HOSPITAL_COMMUNITY)
Admission: RE | Admit: 2021-03-29 | Discharge: 2021-03-29 | Disposition: A | Discharge status: Home / Self Care | Payer: Medicare Other | Source: Ambulatory Visit | Attending: Cardiovascular Disease | Admitting: Cardiovascular Disease

## 2021-03-29 DIAGNOSIS — Z01818 Encounter for other preprocedural examination: Secondary | ICD-10-CM | POA: Diagnosis not present

## 2021-03-29 DIAGNOSIS — I350 Nonrheumatic aortic (valve) stenosis: Secondary | ICD-10-CM | POA: Diagnosis not present

## 2021-03-29 DIAGNOSIS — Z952 Presence of prosthetic heart valve: Secondary | ICD-10-CM | POA: Diagnosis not present

## 2021-03-29 DIAGNOSIS — Z20822 Contact with and (suspected) exposure to covid-19: Secondary | ICD-10-CM | POA: Diagnosis not present

## 2021-03-29 DIAGNOSIS — R262 Difficulty in walking, not elsewhere classified: Secondary | ICD-10-CM | POA: Insufficient documentation

## 2021-03-29 LAB — URINALYSIS, MICROSCOPIC (REFLEX)
Bacteria, UA: NONE SEEN
Squamous Epithelial / HPF: NONE SEEN (ref 0–5)

## 2021-03-29 LAB — COMPREHENSIVE METABOLIC PANEL
ALT: 24 U/L (ref 0–44)
AST: 25 U/L (ref 15–41)
Albumin: 3.8 g/dL (ref 3.5–5.0)
Alkaline Phosphatase: 47 U/L (ref 38–126)
Anion gap: 7 (ref 5–15)
BUN: 20 mg/dL (ref 8–23)
CO2: 21 mmol/L — ABNORMAL LOW (ref 22–32)
Calcium: 9.4 mg/dL (ref 8.9–10.3)
Chloride: 107 mmol/L (ref 98–111)
Creatinine, Ser: 0.76 mg/dL (ref 0.61–1.24)
GFR, Estimated: >60 mL/min (ref >60)
Glucose, Bld: 84 mg/dL (ref 70–99)
Potassium: 4.6 mmol/L (ref 3.5–5.1)
Sodium: 135 mmol/L (ref 135–145)
Total Bilirubin: 0.6 mg/dL (ref 0.3–1.2)
Total Protein: 7 g/dL (ref 6.5–8.1)

## 2021-03-29 LAB — URINALYSIS, ROUTINE W REFLEX MICROSCOPIC
Bilirubin Urine: NEGATIVE
Glucose, UA: NEGATIVE mg/dL
Hgb urine dipstick: NEGATIVE
Ketones, ur: NEGATIVE mg/dL
Nitrite: NEGATIVE
Protein, ur: NEGATIVE mg/dL
Specific Gravity, Urine: >1.03 — ABNORMAL HIGH (ref 1.005–1.030)
pH: 5.5 (ref 5.0–8.0)

## 2021-03-29 LAB — PROTIME-INR
INR: 1.1 (ref 0.8–1.2)
Prothrombin Time: 14.5 seconds (ref 11.4–15.2)

## 2021-03-29 LAB — CBC
HCT: 44 % (ref 39.0–52.0)
Hemoglobin: 14.7 g/dL (ref 13.0–17.0)
MCH: 30.5 pg (ref 26.0–34.0)
MCHC: 33.4 g/dL (ref 30.0–36.0)
MCV: 91.3 fL (ref 80.0–100.0)
Platelets: 145 10*3/uL — ABNORMAL LOW (ref 150–400)
RBC: 4.82 MIL/uL (ref 4.22–5.81)
RDW: 13.5 % (ref 11.5–15.5)
WBC: 7.1 10*3/uL (ref 4.0–10.5)
nRBC: 0 % (ref 0.0–0.2)

## 2021-03-29 LAB — BLOOD GAS, ARTERIAL
Acid-base deficit: 0.7 mmol/L (ref 0.0–2.0)
Bicarbonate: 23.1 mmol/L (ref 20.0–28.0)
Drawn by: 602861
FIO2: 21
O2 Saturation: 97.9 %
Patient temperature: 37
pCO2 arterial: 36 mmHg (ref 32.0–48.0)
pH, Arterial: 7.423 (ref 7.350–7.450)
pO2, Arterial: 103 mmHg (ref 83.0–108.0)

## 2021-03-29 LAB — TYPE AND SCREEN
ABO/RH(D): A NEG
Antibody Screen: NEGATIVE

## 2021-03-29 LAB — SURGICAL PCR SCREEN
MRSA, PCR: NEGATIVE
Staphylococcus aureus: NEGATIVE

## 2021-03-29 LAB — SARS CORONAVIRUS 2 (TAT 6-24 HRS): SARS Coronavirus 2: NEGATIVE

## 2021-03-29 NOTE — Progress Notes (Signed)
PCP - Dr. Gerarda Fraction Cardiologist - Dr. Angelena Form  PPM/ICD - n/a Device Orders -  Rep Notified -   Chest x-ray - 03/29/21 EKG - 03/29/21 Stress Test - 08/27/2016 ECHO - 01/23/2021 Cardiac Cath - 02/13/2021  Sleep Study - patient denies CPAP -   Fasting Blood Sugar - n/a Checks Blood Sugar _____ times a day  Blood Thinner Instructions: Last dose of eliquis was on 03/27/2021 Aspirin Instructions: n/a  ERAS Protcol - NPO after midnight per order PRE-SURGERY Ensure or G2-   COVID TEST- collected at PAT appointment   Anesthesia review:  abnormal EKG  Patient denies shortness of breath, fever, cough and chest pain at PAT appointment   All instructions explained to the patient, with a verbal understanding of the material. Patient agrees to go over the instructions while at home for a better understanding. Patient also instructed to self quarantine after being tested for COVID-19. The opportunity to ask questions was provided.

## 2021-03-29 NOTE — Therapy (Signed)
[place], [place], [address] Phone: [phone]   Fax:  [phone]  Physical Therapy Evaluation  Patient Details  Name: Nicholas Sutton MRN: [id number] Date of Birth: 06/21/1946 Referring Provider (PT): Eileen Stanford, [place]   Encounter Date: 03/29/2021   PT End of Session - 03/29/21 0935     Visit Number 1    Authorization Type MCR    PT Start Time 0932    PT Stop Time 0959    PT Time Calculation (min) 27 min    Equipment Utilized During Treatment Gait belt    Activity Tolerance Patient tolerated treatment well    Behavior During Therapy WFL for tasks assessed/performed             Past Medical History:  Diagnosis Date   Anxiety    Atrial fibrillation ([place])    Diarrhea    Diverticulitis    Erectile dysfunction    Fistula, 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 by Dr Excell Seltzer   Headache(784.0)    x 3 - 4 per year   Nonischemic cardiomyopathy ([place])    Severe aortic stenosis     Past Surgical History:  Procedure Laterality Date   APPENDECTOMY     COLON RESECTION     COLOSTOMY REVISION N/A 07/06/2013   Procedure: COLON RESECTION SIGMOID;  Surgeon: Edward Jolly, MD;  Location: [facility];  Service: General;  Laterality: N/A;   FISTULOTOMY N/A 07/06/2013   Procedure: repair of colovesicle fistula/ FISTULOTOMY;  Surgeon: Edward Jolly, MD;  Location: [facility];  Service: General;  Laterality: N/A;   GUM SURGERY     HERNIA REPAIR     INGUINAL HERNIA REPAIR Left 10/10/2015   Procedure: LEFT INGUINAL HERNIA REPAIR ;  Surgeon: Donnie Mesa, MD;  Location: [place];  Service: General;  Laterality: Left;   INSERTION OF MESH Left 10/10/2015   Procedure: INSERTION OF MESH;  Surgeon: Donnie Mesa, MD;  Location: [place];  Service: General;  Laterality: Left;   RIGHT/LEFT HEART CATH AND CORONARY ANGIOGRAPHY N/A 10/09/2016   Procedure: Right/Left Heart  Cath and Coronary Angiography;  Surgeon: Nelva Bush, MD;  Location: [place] [facility];  Service: Cardiovascular;  Laterality: N/A;   RIGHT/LEFT HEART CATH AND CORONARY ANGIOGRAPHY N/A 02/13/2021   Procedure: RIGHT/LEFT HEART CATH AND CORONARY ANGIOGRAPHY;  Surgeon: Burnell Blanks, MD;  Location: [place] [facility];  Service: Cardiovascular;  Laterality: N/A;    There were no vitals filed for this visit.    Subjective Assessment - 03/29/21 0933     Subjective Patient reports having a fatigue issue has been going on for several years with recent worsening over the past month. He has A fib and has been getting echocardiogram annually. He reports the fatigue is starting to worsen for him. Based on his last echo "his heart valve has gotten slightly worse." He also reports shortness of breath with exertion. He denies dizziness or chest pain.    Currently in Pain? No/denies                [facility] PT Assessment - 03/29/21 0001       Assessment   Medical Diagnosis I35.0 (ICD-10-CM) - Severe aortic stenosis    Referring Provider (PT) Eileen Stanford, PA-C    Onset Date/Surgical Date --   a couple years; recent worsening about a month ago   Hand Dominance Right    Next MD  Visit visit today      Precautions   Precautions None      Restrictions   Weight Bearing Restrictions No      Balance Screen   Has the patient fallen in the past 6 months No      [place] residence    Living Arrangements Alone    Type of [place]      Prior Function   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     5 Meter Walk Test- trial 3 5 sec.    5 meter walk test average 5 sec    4 Stage Balance Test tolerated for:  10 sec.    4 Stage Balance Test Position 4    Sit To Stand Test- trial 1 11.4 sec.    BP (mmHg) 125/93    HR (bpm) 90    02 Sat (%RA) 98 %    Modified Borg Scale for Dyspnea 0.5- Very, very slight shortness of breath    Perceived Rate of Exertion (Borg) 7- Very, very light    BP (mmHg) 133/91    HR (bpm) 137    02 Sat (%RA) 97 %    Modified Borg Scale for Dyspnea 3- Moderate shortness of breath or breathing difficulty    Perceived Rate of Exertion (Borg) 12-    Aerobic Endurance Distance Walked 1425    Endurance additional comments 17.5% disability compared to age related norms                      Objective measurements completed on examination: See above findings.                             Plan - 03/29/21 1003     PT Frequency One time visit    Consulted and Agree with Plan of Care Patient             Clinical Impression Statement: Pt is a 75 yo male presenting to OP PT for evaluation prior to possible TAVR surgery due to severe aortic stenosis. Pt reports onset of fatigue and shortness of breath for a couple years with recent worsening over the past month. Symptoms are  limiting ability to complete more strenuous household activities. Pt presents with WNL ROM and strength and 0/10 musculoskeletal pain.  Pt ambulated a total of 1425 feet in 6 minute walk requiring no rest breaks. He reported 3/10 SOB on modified scale for dyspena and 12/20 RPE on Borg's perceived exertion and 0/10 pain scale at the end of the walk. During the 6 minute walk test, patient's HR increased to 137 BPM and O2  saturation decreased to 97%. Based on the Short  Physical Performance Battery, patient has a frailty rating of 11/12 with </= 5/12 considered frail.  Visit Diagnosis: Difficulty in walking, not elsewhere classified     Problem List Patient Active Problem List   Diagnosis Date Noted   Severe aortic stenosis    Erectile dysfunction 11/03/2016   Coronary artery disease involving native coronary artery of native heart without angina pectoris 11/03/2016   Moderate aortic stenosis 11/03/2016   NICM (nonischemic cardiomyopathy) ([facility]) 10/09/2016   Abnormal stress test 10/09/2016   Diverticulitis of large intestine 07/06/2013   Atrial fibrillation ([place]) 07/04/2013   Preoperative cardiovascular examination 07/04/2013   Fistula, intestinovesical 05/09/2013   DIVERTICULITIS, COLON 09/03/2009  Gwendolyn Grant, PT, DPT, ATC 03/29/21 10:08 AM  [facility] [address] [place], [place], [address] Phone: [phone]   Fax:  [phone]  Name: NAHZIR SCHULT MRN: [id number] Date of Birth: 04/01/1946

## 2021-04-01 ENCOUNTER — Encounter (HOSPITAL_COMMUNITY): Payer: Self-pay | Admitting: Cardiovascular Disease

## 2021-04-01 MED ORDER — DEXMEDETOMIDINE HCL IN NACL 400 MCG/100ML IV SOLN
0.1000 ug/kg/h | INTRAVENOUS | Status: AC
  Administered 2021-04-02: 74.6 ug via INTRAVENOUS
  Administered 2021-04-02: 1 ug/kg/h via INTRAVENOUS
  Filled 2021-04-01 (×2): qty 100

## 2021-04-01 MED ORDER — CEFAZOLIN SODIUM-DEXTROSE 2-4 GM/100ML-% IV SOLN
2.0000 g | INTRAVENOUS | Status: AC
  Administered 2021-04-02: 2 g via INTRAVENOUS
  Filled 2021-04-01 (×3): qty 100

## 2021-04-01 MED ORDER — MAGNESIUM SULFATE 50 % IJ SOLN
40.0000 meq | INTRAMUSCULAR | Status: DC
  Filled 2021-04-01: qty 9.85

## 2021-04-01 MED ORDER — POTASSIUM CHLORIDE 2 MEQ/ML IV SOLN
80.0000 meq | INTRAVENOUS | Status: DC
  Filled 2021-04-01: qty 40

## 2021-04-01 MED ORDER — NOREPINEPHRINE 4 MG/250ML-% IV SOLN
0.0000 ug/min | INTRAVENOUS | Status: AC
  Administered 2021-04-02: 2 ug/min via INTRAVENOUS
  Filled 2021-04-01: qty 250

## 2021-04-01 MED ORDER — SODIUM CHLORIDE 0.9 % IV SOLN
INTRAVENOUS | Status: DC
  Filled 2021-04-01: qty 30

## 2021-04-01 NOTE — H&P (Signed)
[address]       [address],[place] [address]             [phone]      Cardiothoracic Surgery Admission History and Physical   PCP is Redmond School, MD Referring Provider is Lauree Chandler, MD Primary Cardiologist is Lauree Chandler, MD   Reason for admission:  Severe aortic stenosis   HPI:   The patient is a 75 year old gentleman with a history of persistent atrial fibrillation on Eliquis, nonischemic cardiomyopathy, remote tobacco abuse, and aortic stenosis who was referred for evaluation for a TAVR.  He had an echocardiogram in January 2019 showing an ejection fraction of 35 to 40% with moderate aortic stenosis.  Cardiac catheterization at that time showed mild coronary disease with normal ejection fraction.  A follow-up echocardiogram in August 2020 showed a mean gradient of 28.5 mmHg and a valve area of 0.77 cm and a dimensionless index was 0.24.  He remained asymptomatic at that time.  He had a repeat echocardiogram in August 2021 showing a mean gradient of 28.7 mmHg across aortic valve with a peak gradient of 50 mmHg.  Aortic valve area was 0.7 cm with a dimensionless index of 0.26.  Left ventricular ejection fraction was 60 to 65%.  He remained asymptomatic.  His most recent echocardiogram on 01/23/2021 showed an increase in the mean gradient to 35 mmHg with a peak gradient of 56 mmHg.  Aortic valve area was 0.65 cm with a dimensionless index of 0.25.  He now has progressive exertional fatigue and shortness of breath over the past 3 to 6 months.  He denies any chest pain or pressure.  He has had no dizziness or syncope.  He denies peripheral edema.  He has had no orthopnea.   He is retired.  He reports having bilateral inguinal hernias that require repair and has been seen by Dr. Georgette Dover.       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 by Dr Excell Seltzer   Headache(784.0)      x 3 - 4 per year   Nonischemic cardiomyopathy ([place])     Severe aortic stenosis             Past Surgical History:  Procedure Laterality Date   APPENDECTOMY       COLON RESECTION       COLOSTOMY REVISION N/A 07/06/2013    Procedure: COLON RESECTION SIGMOID;  Surgeon: Edward Jolly, MD;  Location: [facility];  Service: General;  Laterality: N/A;   FISTULOTOMY N/A 07/06/2013    Procedure: repair of colovesicle fistula/ FISTULOTOMY;  Surgeon: Edward Jolly, MD;  Location: [facility];  Service: General;  Laterality: N/A;   GUM SURGERY       HERNIA REPAIR       INGUINAL HERNIA REPAIR Left 10/10/2015    Procedure: LEFT INGUINAL HERNIA REPAIR ;  Surgeon: Donnie Mesa, MD;  Location: [facility];  Service: General;  Laterality: Left;   INSERTION OF MESH Left 10/10/2015    Procedure: INSERTION OF MESH;  Surgeon: Donnie Mesa, MD;  Location: [facility];  Service: General;  Laterality: Left;   RIGHT/LEFT HEART CATH AND CORONARY ANGIOGRAPHY N/A 10/09/2016    Procedure: Right/Left  Heart Cath and Coronary Angiography;  Surgeon: Nelva Bush, MD;  Location: [facility];  Service: Cardiovascular;  Laterality: N/A;   RIGHT/LEFT HEART CATH AND CORONARY ANGIOGRAPHY N/A 02/13/2021    Procedure: RIGHT/LEFT HEART CATH AND CORONARY ANGIOGRAPHY;  Surgeon: Burnell Blanks, MD;  Location: [facility];  Service: Cardiovascular;  Laterality: N/A;           Family History  Problem Relation Age of Onset   Stroke Mother 65   Heart attack Father 20   Hypertension Unknown        Social History         Socioeconomic History   Marital status: Divorced      Spouse name: Not on file   Number of children: Not on file   Years of education: Not on file   Highest education level: Not on file  Occupational History   Not on file  Tobacco Use   Smoking status: Former       Packs/day: 1.50      Years: 30.00      Pack years: 45.00      Types: Cigarettes      Quit date: 06/03/2008      Years since quitting: 12.8   Smokeless tobacco: Never  Vaping Use   Vaping Use: Never used  Substance and Sexual Activity   Alcohol use: No      Comment: rare   Drug use: No   Sexual activity: Never      Birth control/protection: None  Other Topics Concern   Not on file  Social History Narrative   Not on file    Social Determinants of Health    Financial Resource Strain: Not on file  Food Insecurity: Not on file  Transportation Needs: Not on file  Physical Activity: Not on file  Stress: Not on file  Social Connections: Not on file  Intimate Partner Violence: Not on file             Prior to Admission medications   Medication Sig Start Date End Date Taking? Authorizing Provider  acetaminophen (TYLENOL) 500 MG tablet Take 1,000 mg by mouth every 6 (six) hours as needed for headache.     Yes [provider]  Bioflavonoid Products (ESTER-C) 500-550 MG TABS Take 1 tablet by mouth in the morning.     Yes [provider]  carvedilol (COREG) 3.125 MG tablet Take 1 tablet (3.125 mg total) by mouth 2 (two) times daily. 12/19/20   Yes Burnell Blanks, MD  cholecalciferol (VITAMIN D) 25 MCG (1000 UNIT) tablet Take 1,000 Units by mouth in the morning and at bedtime.     Yes [provider]  ELIQUIS 5 MG TABS tablet TAKE 1 TABLET(5 MG) BY MOUTH TWICE DAILY 12/19/20   Yes Burnell Blanks, MD  loratadine (CLARITIN) 10 MG tablet Take 10 mg by mouth daily as needed for allergies.     Yes [provider]  losartan (COZAAR) 25 MG tablet TAKE 1 TABLET(25 MG) BY MOUTH DAILY 09/20/20   Yes Burnell Blanks, MD  Multiple Vitamins-Minerals (PRESERVISION AREDS 2 PO) Take 1 tablet by mouth in the morning and at bedtime.     Yes [provider]  Omega-3 Fatty Acids (FISH OIL) 1000 MG CAPS Take 1,000 mg by mouth in the morning and  at bedtime.     Yes [provider]  tadalafil (CIALIS) 5 MG tablet Take 5 mg by mouth daily as needed  for erectile dysfunction. 02/03/19   Yes [provider]  vitamin C (ASCORBIC ACID) 500 MG tablet Take 500 mg by mouth every evening.     Yes [provider]            Current Outpatient Medications  Medication Sig Dispense Refill   acetaminophen (TYLENOL) 500 MG tablet Take 1,000 mg by mouth every 6 (six) hours as needed for headache.       Bioflavonoid Products (ESTER-C) 500-550 MG TABS Take 1 tablet by mouth in the morning.       carvedilol (COREG) 3.125 MG tablet Take 1 tablet (3.125 mg total) by mouth 2 (two) times daily. 180 tablet 2   cholecalciferol (VITAMIN D) 25 MCG (1000 UNIT) tablet Take 1,000 Units by mouth in the morning and at bedtime.       ELIQUIS 5 MG TABS tablet TAKE 1 TABLET(5 MG) BY MOUTH TWICE DAILY 180 tablet 1   loratadine (CLARITIN) 10 MG tablet Take 10 mg by mouth daily as needed for allergies.       losartan (COZAAR) 25 MG tablet TAKE 1 TABLET(25 MG) BY MOUTH DAILY 90 tablet 3   Multiple Vitamins-Minerals (PRESERVISION AREDS 2 PO) Take 1 tablet by mouth in the morning and at bedtime.       Omega-3 Fatty Acids (FISH OIL) 1000 MG CAPS Take 1,000 mg by mouth in the morning and at bedtime.       tadalafil (CIALIS) 5 MG tablet Take 5 mg by mouth daily as needed for erectile dysfunction.       vitamin C (ASCORBIC ACID) 500 MG tablet Take 500 mg by mouth every evening.        No current facility-administered medications for this visit.           Allergies  Allergen Reactions   Dust Mite Extract Other (See Comments)      Sneezing   Pollen Extract Other (See Comments)      Sneezing          Review of Systems:               General:                      normal appetite, + decreased energy, no weight gain, no weight loss, no fever             Cardiac:                       no chest pain with exertion, no chest pain at rest, +SOB with  moderate exertion, no resting SOB, no PND, no orthopnea, no palpitations, + arrhythmia, + atrial fibrillation, no LE edema, no dizzy spells, no syncope             Respiratory:                 + exertional shortness of breath, no home oxygen, no productive cough, no dry cough, no bronchitis, no wheezing, no hemoptysis, no asthma, no pain with inspiration or cough, no sleep apnea, no CPAP at night             GI:                               no difficulty swallowing, no reflux,no frequent heartburn, no hiatal hernia, no abdominal pain, no constipation, no diarrhea, no hematochezia, no  hematemesis, no melena             GU:                              no dysuria,  no frequency, no urinary tract infection, no hematuria, no enlarged prostate, no kidney stones, no kidney disease             Vascular:                     no pain suggestive of claudication, no pain in feet, no leg cramps, no varicose veins, no DVT, no non-healing foot ulcer             Neuro:                         no stroke, no TIA's, no seizures, no headaches, no temporary blindness one eye,  no slurred speech, no peripheral neuropathy, no chronic pain, no instability of gait, no memory/cognitive dysfunction             Musculoskeletal:         no arthritis, no joint swelling, no myalgias, no difficulty walking, no mobility              Skin:                            no rash, no itching, no skin infections, no pressure sores or ulcerations             Psych:                         no anxiety, no depression, no nervousness, no unusual recent stress             Eyes:                           no blurry vision, no floaters, no recent vision changes, + wears glasses             ENT:                            mp hearing loss, mp loose or painful teeth, mp dentures, last saw dentist this year             Hematologic:               no easy bruising, no abnormal bleeding, no clotting disorder, no frequent epistaxis             Endocrine:                    no diabetes, does not check CBG's at home                            Physical Exam:               BP 123/87   Pulse 84   Resp 20   Ht 6' (1.829 m)   Wt 165 lb (74.8 kg)   SpO2 95%   BMI 22.38 kg/m              General:  well-appearing             HEENT:                       Unremarkable, NCAT, PERLA, EOMI             Neck:                           no JVD, no bruits, no adenopathy              Chest:                          clear to auscultation, symmetrical breath sounds, no wheezes, no rhonchi              CV:                              RRR, 3/6 systolic murmur RSB             Abdomen:                    soft, non-tender, no masses              Extremities:                 warm, well-perfused, pulses palpable at ankle, no lower extremity edema             Rectal/GU                   Deferred             Neuro:                         Grossly non-focal and symmetrical throughout             Skin:                            Clean and dry, no rashes, no breakdown   Diagnostic Tests:   Patient Name:   CHANS EWERT Date of Exam: 01/23/2021  Medical Rec #:  [id number]        Height:       72.0 in  Accession #:    [id number]       Weight:       164.6 lb  Date of Birth:  04-19-1946       BSA:          1.961 m  Patient Age:    55 years         BP:           99/67 mmHg  Patient Gender: M                HR:           80 bpm.  Exam Location:  [facility]   Procedure: 2D Echo, Cardiac Doppler and Color Doppler   Indications:    I35.0 (ICD-10-CM) - Severe aortic stenosis     History:        Patient has prior history of Echocardiogram examinations,  most                  recent 08/16/2020. Aortic Valve Disease, Arrythmias:Atrial  Fibrillation; Signs/Symptoms:Murmur. NICM (nonischemic                  cardiomyopathy)(From Hx).     Sonographer:    Alvino Chapel RCS  Referring Phys: [place]     1.  Left ventricular ejection fraction, by estimation, is 60 to 65%. The  left ventricle has normal function. The left ventricle has no regional  wall motion abnormalities. Left ventricular diastolic parameters are  indeterminate.   2. Right ventricular systolic function is normal. The right ventricular  size is normal. There is normal pulmonary artery systolic pressure.   3. Left atrial size was mildly dilated.   4. Right atrial size was moderately dilated.   5. The mitral valve is abnormal. No evidence of mitral valve  regurgitation. No evidence of mitral stenosis.   6. Since TTE done 08/16/20 mean gradient increased from 28.7 to 35.3 mmHg  and peak from 49.5 to 56 mmHg            DVI 0.25 and AVA 0.65 cm2 consistent wtih severe AS Fused right and  left cusps. The aortic valve is calcified. Aortic valve regurgitation is  mild. Severe aortic valve stenosis.   7. The inferior vena cava is normal in size with greater than 50%  respiratory variability, suggesting right atrial pressure of 3 mmHg.   FINDINGS   Left Ventricle: Left ventricular ejection fraction, by estimation, is 60  to 65%. The left ventricle has normal function. The left ventricle has no  regional wall motion abnormalities. The left ventricular internal cavity  size was normal in size. There is   no left ventricular hypertrophy. Left ventricular diastolic parameters  are indeterminate.   Right Ventricle: The right ventricular size is normal. No increase in  right ventricular wall thickness. Right ventricular systolic function is  normal. There is normal pulmonary artery systolic pressure. The tricuspid  regurgitant velocity is 2.65 m/s, and   with an assumed right atrial pressure of 3 mmHg, the estimated right  ventricular systolic pressure is 99991111 mmHg.   Left Atrium: Left atrial size was mildly dilated.   Right Atrium: Right atrial size was moderately dilated.   Pericardium: There is no evidence of pericardial  effusion.   Mitral Valve: The mitral valve is abnormal. There is mild thickening of  the mitral valve leaflet(s). There is mild calcification of the mitral  valve leaflet(s). Mild mitral annular calcification. No evidence of mitral  valve regurgitation. No evidence of  mitral valve stenosis.   Tricuspid Valve: The tricuspid valve is normal in structure. Tricuspid  valve regurgitation is mild . No evidence of tricuspid stenosis.   Aortic Valve: Since TTE done 08/16/20 mean gradient increased from 28.7 to  35.3 mmHg and peak from 49.5 to 56 mmHg   DVI 0.25 and AVA 0.65 cm2 consistent wtih severe AS Fused right and left  cusps. The aortic valve is calcified. Aortic valve regurgitation is mild.  Aortic regurgitation PHT measures 379 msec. Severe aortic stenosis is  present. Aortic valve mean gradient  measures 35.3 mmHg. Aortic valve peak gradient measures 56.1 mmHg. Aortic  valve area, by VTI measures 0.64 cm.   Pulmonic Valve: The pulmonic valve was normal in structure. Pulmonic valve  regurgitation is not visualized. No evidence of pulmonic stenosis.   Aorta: The aortic root is normal in size and structure.   Venous: The inferior vena cava is normal in size with greater than 50%  respiratory variability,  suggesting right atrial pressure of 3 mmHg.   IAS/Shunts: No atrial level shunt detected by color flow Doppler.      LEFT VENTRICLE  PLAX 2D  LVIDd:         4.00 cm  LVIDs:         2.00 cm  LV PW:         0.90 cm  LV IVS:        0.90 cm  LVOT diam:     1.80 cm  LV SV:         56  LV SV Index:   28  LVOT Area:     2.54 cm      RIGHT VENTRICLE  RV S prime:     13.10 cm/s  TAPSE (M-mode): 2.2 cm   LEFT ATRIUM             Index       RIGHT ATRIUM           Index  LA diam:        3.90 cm 1.99 cm/m  RA Area:     22.40 cm  LA Vol (A2C):   83.0 ml 42.32 ml/m RA Volume:   61.40 ml  31.31 ml/m  LA Vol (A4C):   66.7 ml 34.01 ml/m  LA Biplane Vol: 77.2 ml 39.36 ml/m    AORTIC VALVE  AV Area (Vmax):    0.65 cm  AV Area (Vmean):   0.62 cm  AV Area (VTI):     0.64 cm  AV Vmax:           374.33 cm/s  AV Vmean:          278.000 cm/s  AV VTI:            0.862 m  AV Peak Grad:      56.1 mmHg  AV Mean Grad:      35.3 mmHg  LVOT Vmax:         95.95 cm/s  LVOT Vmean:        67.950 cm/s  LVOT VTI:          0.218 m  LVOT/AV VTI ratio: 0.25  AI PHT:            379 msec     AORTA  Ao Root diam: 3.40 cm   MITRAL VALVE              TRICUSPID VALVE  MV Area (PHT): 3.38 cm   TR Peak grad:   28.1 mmHg  MV Decel Time: 225 msec   TR Vmax:        265.00 cm/s  MV E velocity: 1.10 cm/s                            SHUNTS                            Systemic VTI:  0.22 m                            Systemic Diam: 1.80 cm   Jenkins Rouge MD  Electronically signed by Jenkins Rouge MD  Signature Date/Time: 01/23/2021/4:03:16 PM       Physicians   Panel Physicians Referring Physician Case Authorizing Physician  Burnell Blanks, MD (Primary)        Procedures  RIGHT/LEFT HEART CATH AND CORONARY ANGIOGRAPHY    Conclusion       Ost 1st Diag to 1st Diag lesion is 20% stenosed.   Dist LAD lesion is 20% stenosed.   Ost Cx to Mid Cx lesion is 20% stenosed.   Mid LAD lesion is 20% stenosed.   Mild non-obstructive CAD Severe aortic stenosis by echo. Cath data: peak to peak gradient 31 mmHg, mean gradient 30.4 mmHg).    Recommendations: Will continue workup for TAVR.    Indications   Nonrheumatic aortic valve stenosis [I35.0 (ICD-10-CM)]    Procedural Details   Technical Details Indication: Aortic stenosis, workup for TAVR  Procedure: The risks, benefits, complications, treatment options, and expected outcomes were discussed with the patient. The patient and/or family concurred with the proposed plan, giving informed consent. The patient was brought to the cath lab after IV hydration was given. The patient was sedated with Versed and Fentanyl. The IV  catheter present in the right antecubital vein was changed for a 5 [place] sheath. Right heart catheterization performed with a balloon tipped catheter. The right wrist was prepped and draped in a sterile fashion. 1% lidocaine was used for local anesthesia. Using the modified Seldinger access technique, a 5 French sheath was placed in the right radial artery. 3 mg Verapamil was given through the sheath. 4000 units IV heparin was given. Standard diagnostic catheters were used to perform selective coronary angiography. I crossed the aortic valve with the JR4 catheter and a J wire. The sheath was removed from the right radial artery and a Terumo hemostasis band was applied at the arteriotomy site on the right wrist.      Estimated blood loss <50 mL.   During this procedure medications were administered to achieve and maintain moderate conscious sedation while the patient's heart rate, blood pressure, and oxygen saturation were continuously monitored and I was present face-to-face 100% of this time.    Medications (Filter: Administrations occurring from 0935 to 1037 on 02/13/21)  important  Continuous medications are totaled by the amount administered until 02/13/21 1037.    fentaNYL (SUBLIMAZE) injection (mcg) Total dose:  50 mcg Date/Time Rate/Dose/Volume Action    02/13/21 0948 25 mcg Given    1010 25 mcg Given      midazolam (VERSED) injection (mg) Total dose:  2 mg Date/Time Rate/Dose/Volume Action    02/13/21 0949 1 mg Given    1010 1 mg Given      lidocaine (PF) (XYLOCAINE) 1 % injection (mL) Total volume:  5 mL Date/Time Rate/Dose/Volume Action    02/13/21 1005 5 mL Given      Radial Cocktail/Verapamil only (mL) Total volume:  10 mL Date/Time Rate/Dose/Volume Action    02/13/21 1011 10 mL Given      heparin sodium (porcine) injection (Units) Total dose:  4,000 Units Date/Time Rate/Dose/Volume Action    02/13/21 1016 4,000 Units Given      Heparin (Porcine) in NaCl 1000-0.9  UT/500ML-% SOLN (mL) Total volume:  1,000 mL Date/Time Rate/Dose/Volume Action    02/13/21 1032 500 mL Given    1032 500 mL Given      iohexol (OMNIPAQUE) 350 MG/ML injection (mL) Total volume:  40 mL Date/Time Rate/Dose/Volume Action    02/13/21 1034 40 mL Given      Sedation Time   Sedation Time Physician-1: 35 minutes 21 seconds Contrast   Medication Name Total Dose  iohexol (OMNIPAQUE) 350 MG/ML injection 40 mL    Radiation/Fluoro   Fluoro time:  5 (min) DAP: 6.7 (Gycm2) Cumulative Air Kerma: 130.8 (mGy) Coronary Findings   Diagnostic Dominance: Left Left Main  Vessel is large. Vessel is angiographically normal.  Left Anterior Descending  Mid LAD lesion is 20% stenosed.  Dist LAD lesion is 20% stenosed.  First Diagonal Branch  Vessel is moderate in size.  Ost 1st Diag to 1st Diag lesion is 20% stenosed. The lesion is eccentric. The lesion is mildly calcified.  Left Circumflex  Vessel is large.  Ost Cx to Mid Cx lesion is 20% stenosed.  First Obtuse Marginal Branch  Vessel is small in size.  Second Obtuse Marginal Branch  Vessel is large in size.  Third Obtuse Marginal Branch  Vessel is small in size.  Fourth Obtuse Marginal Branch  Vessel is small in size.  Right Coronary Artery  Vessel is small. Vessel is angiographically normal.  Intervention   No interventions have been documented. Coronary Diagrams   Diagnostic Dominance: Left Intervention   Implants      No implant documentation for this case.    Syngo Images    Show images for CARDIAC CATHETERIZATION Images on Long Term Storage    Show images for Weida, Lattie Clarida "Wes" Link to Procedure Log   Procedure Log    Hemo Data   Flowsheet Row Most Recent Value  Fick Cardiac Output 4.7 L/min  Fick Cardiac Output Index 2.39 (L/min)/BSA  Aortic Mean Gradient 30.4 mmHg  Aortic Peak Gradient 31 mmHg  Aortic Valve Area 1.49  Aortic Value Area Index 0.76 cm2/BSA  RA A Wave 2 mmHg  RA V Wave  2 mmHg  RA Mean 1 mmHg  RV Systolic Pressure 17 mmHg  RV Diastolic Pressure 0 mmHg  RV EDP 1 mmHg  PA Systolic Pressure 20 mmHg  PA Diastolic Pressure 0 mmHg  PA Mean 13 mmHg  PW A Wave 7 mmHg  PW V Wave 8 mmHg  PW Mean 5 mmHg  AO Systolic Pressure 80 mmHg  AO Diastolic Pressure 54 mmHg  AO Mean 67 mmHg  LV Systolic Pressure 123456 mmHg  LV Diastolic Pressure 2 mmHg  LV EDP 5 mmHg  AOp Systolic Pressure 88 mmHg  AOp Diastolic Pressure 58 mmHg  AOp Mean Pressure 73 mmHg  LVp Systolic Pressure 123456 mmHg  LVp Diastolic Pressure 1 mmHg  LVp EDP Pressure 5 mmHg  QP/QS 1  TPVR Index 5.43 HRUI  TSVR Index 28.01 HRUI  PVR SVR Ratio 0.12  TPVR/TSVR Ratio 0.19      ADDENDUM REPORT: 02/21/2021 13:18   CLINICAL DATA:  Severe Aortic Stenosis.   EXAM: Cardiac TAVR CT   TECHNIQUE: A non-contrast, gated CT scan was obtained with axial slices of 3 mm through the heart for aortic valve calcium scoring. A 120 kV retrospective, gated, contrast cardiac scan was obtained. Gantry rotation speed was 250 msecs and collimation was 0.6 mm. Nitroglycerin was not given. The 3D data set was reconstructed in 5% intervals of the 0-95% of the R-R cycle. Systolic and diastolic phases were analyzed on a dedicated workstation using MPR, MIP, and VRT modes. The patient received 100 cc of contrast.   FINDINGS: Image quality: Excellent.   Noise artifact is: Limited.   Valve Morphology: The aortic valve is tricuspid with diffuse, severe calcifications. There is bulky calcification of the [place] noted. The leaflets are severely restricted in systole consistent with severe aortic stenosis.   Aortic Valve Calcium score: 1953   Aortic annular dimension:   Phase assessed: 290 ms  Annular area: 405 mm2   Annular perimeter: 72.6 mm   Max diameter: 26.2 mm   Min diameter: 19.8 mm   Annular and subannular calcification: None.   Optimal coplanar projection: LAO 8 CAU 8   Coronary Artery Height  above Annulus:   Left Main: 14.1 mm   Right Coronary: 15.9 mm   Sinus of Valsalva Measurements:   Non-coronary: 33 mm   Right-coronary: 33 mm   Left-coronary: 34 mm   Sinus of Valsalva Height:   Non-coronary: 25.6 mm   Right-coronary: 21.6 mm   Left-coronary: 21.3 mm   Sinotubular Junction: 31 mm   Ascending Thoracic Aorta: 34 mm   Coronary Arteries: Normal coronary origin. Left dominance. The study was performed without use of NTG and is insufficient for plaque evaluation. Please refer to recent cardiac catheterization for coronary assessment. Mild coronary calcifications noted.   Cardiac Morphology:   Right Atrium: Right atrial size is dilated.   Right Ventricle: The right ventricular cavity is within normal limits.   Left Atrium: Left atrial size is dilated. There is a filling defect in the LAA which is consistent with mixing artifact.   Left Ventricle: The ventricular cavity size is within normal limits. There are no stigmata of prior infarction. There is no abnormal filling defect.   Pulmonary arteries: Normal in size without proximal filling defect.   Pulmonary veins: Normal pulmonary venous drainage.   Pericardium: Normal thickness with no significant effusion or calcium present.   Mitral Valve: The mitral valve is normal structure without significant calcification.   Extra-cardiac findings: See attached radiology report for non-cardiac structures.   IMPRESSION: 1. Tricuspid aortic valve with severe calcifications. Bulky calcification of the [place] noted.   2. Annular measurements appropriate for 23 mm S3 (405 mm2). Measurements made at 290 ms due to motion artifact.   3. No significant annular or subannular calcifications.   4. Sufficient coronary to annulus distance.   5. Optimal Fluoroscopic Angle for Delivery: LAO 8 CAU 8   6. There is a filling defect in the LAA which is consistent with mixing artifact.   [place] T. Audie Box, MD      Electronically Signed   By: Eleonore Chiquito   On: 02/21/2021 13:18    Addended by Geralynn Rile, MD on 02/21/2021  1:20 PM    Study Result   Narrative & Impression  EXAM: OVER-READ INTERPRETATION  CT CHEST   The following report is an over-read performed by radiologist Dr. Vinnie Langton of [facility], [place] on 02/19/2021. This over-read does not include interpretation of cardiac or coronary anatomy or pathology. The coronary calcium score/coronary CTA interpretation by the cardiologist is attached.   COMPARISON:  None.   FINDINGS: Extracardiac findings will be described separately under dictation for contemporaneously obtained CTA chest, abdomen and pelvis.   IMPRESSION: Please see separate dictation for contemporaneously obtained CTA chest, abdomen and pelvis dated 02/19/2021 for full description of relevant extracardiac findings.   Electronically Signed: By: Vinnie Langton M.D. On: 02/19/2021 10:27      CLINICAL DATA:  75 year old male with history of severe aortic stenosis. Preprocedural study prior to potential transcatheter aortic valve replacement (TAVR) procedure.   EXAM: CT ANGIOGRAPHY CHEST, ABDOMEN AND PELVIS   TECHNIQUE: Multidetector CT imaging through the chest, abdomen and pelvis was performed using the standard protocol during bolus administration of intravenous contrast. Multiplanar reconstructed images and MIPs were obtained and reviewed to evaluate the vascular anatomy.   CONTRAST:  165m OMNIPAQUE IOHEXOL  350 MG/ML SOLN   COMPARISON:  No prior chest CT. CT the abdomen and pelvis 05/09/2013.   FINDINGS: CTA CHEST FINDINGS   Cardiovascular: Heart size is mildly enlarged with right atrial and right ventricular dilatation. There is no significant pericardial fluid, thickening or pericardial calcification. There is aortic atherosclerosis, as well as atherosclerosis of the great vessels of the mediastinum and the coronary  arteries, including calcified atherosclerotic plaque in the left main, left anterior descending, left circumflex and right coronary arteries. Severe thickening and calcification of the aortic valve.   Mediastinum/Lymph Nodes: No pathologically enlarged mediastinal or hilar lymph nodes. Esophagus is unremarkable in appearance. No axillary lymphadenopathy.   Lungs/Pleura: No suspicious appearing pulmonary nodules or masses are noted. No acute consolidative airspace disease. No pleural effusions.   Musculoskeletal/Soft Tissues: At T11 there is a new but nonacute compression fracture of the superior endplate with [id number] loss of anterior vertebral body height. Mild compression deformity also noted in the superior endplate of T5 with D34-534 loss of anterior vertebral body height. There are no aggressive appearing lytic or blastic lesions noted in the visualized portions of the skeleton.   CTA ABDOMEN AND PELVIS FINDINGS   Hepatobiliary: Multilobular low-attenuation lesion predominantly in segment 2 of the liver (axial image 100 of series 5) measuring 2.0 x 1.4 cm, similar to the prior examination, compatible with a hepatic cyst. No other suspicious hepatic lesions. No intra or extrahepatic biliary ductal dilatation. Gallbladder is normal in appearance.   Pancreas: No pancreatic mass. No pancreatic ductal dilatation. No pancreatic or peripancreatic fluid collections or inflammatory changes.   Spleen: Unremarkable.   Adrenals/Urinary Tract: Bilateral kidneys and bilateral adrenal glands are normal in appearance. No hydroureteronephrosis. Urinary bladder is nearly decompressed, but otherwise unremarkable in appearance.   Stomach/Bowel: The appearance of the stomach is normal. No pathologic dilatation of small bowel or colon. Status post partial colectomy with a suture line near the rectosigmoid junction. The appendix is not confidently identified and may be surgically absent. Regardless,  there are no inflammatory changes noted adjacent to the cecum to suggest the presence of an acute appendicitis at this time.   Vascular/Lymphatic: Fusiform aneurysmal dilatation of the infrarenal abdominal aorta which measures up to 3.7 x 3.0 cm. There is also fusiform ectasia of the distal infrarenal abdominal aorta immediately above the level of the bifurcation measuring up to 2.3 x 2.6 cm. Vascular findings and measurements pertinent to potential TAVR procedure, as detailed below. No lymphadenopathy noted in the abdomen or pelvis.   Reproductive: Prostate gland and seminal vesicles are unremarkable in appearance.   Other: No significant volume of ascites.  No pneumoperitoneum.   Musculoskeletal: There are no aggressive appearing lytic or blastic lesions noted in the visualized portions of the skeleton.   VASCULAR MEASUREMENTS PERTINENT TO TAVR:   AORTA:   Minimal Aortic Diameter-15 x 13 mm   Severity of Aortic Calcification-severe   RIGHT PELVIS:   Right Common Iliac Artery -   Minimal Diameter-6.6 x 6.4 mm   Tortuosity-mild   Calcification-moderate   Right External Iliac Artery -   Minimal Diameter-4.9 x 4.7 mm   Tortuosity - mild   Calcification-minimal   Right Common Femoral Artery -   Minimal Diameter-5.8 x 5.4 mm   Tortuosity - mild   Calcification-mild   LEFT PELVIS:   Left Common Iliac Artery -   Minimal Diameter-6.5 x 5.5 mm   Tortuosity - mild   Calcification-moderate to severe   Left External Iliac Artery -  Minimal Diameter-5.5 x 5.5 mm   Tortuosity - mild   Calcification-none   Left Common Femoral Artery -   Minimal Diameter-4.7 x 6.0 mm   Tortuosity - mild   Calcification-mild   Review of the MIP images confirms the above findings.   IMPRESSION: 1. Vascular findings and measurements pertinent to potential TAVR procedure, as detailed above. 2. Severe thickening calcification of the aortic valve, compatible with  reported clinical history of severe aortic stenosis. 3. Cardiomegaly with right atrial and right ventricular dilatation. 4. Aortic atherosclerosis, in addition to left main and 3 vessel coronary artery disease. There is also fusiform aneurysmal dilatation of the infrarenal abdominal aorta which measures up to 3.7 x 3.0 cm (mean diameter of 3.35 cm). Recommend follow-up ultrasound every 3 years. This recommendation follows ACR consensus guidelines: White Paper of the ACR Incidental Findings Committee II on Vascular Findings. J Am Coll Radiol 2013; 10:789-794. 5. Additional incidental findings, as above     Electronically Signed   By: Vinnie Langton M.D.   On: 02/19/2021 12:46     STS Risk Score:  Procedure: AVR + CAB Risk of Mortality: 1.570% Renal Failure: 1.255% Permanent Stroke: 1.334% Prolonged Ventilation: 4.791% DSW Infection: 0.070% Reoperation: 3.226% Morbidity or Mortality: 7.776% Short Length of Stay: 46.588% Long Length of Stay: 3.705%   Impression:   This 75 year old gentleman has stage D, severe, symptomatic aortic stenosis with New York Heart Association class II symptoms of exertional fatigue and shortness of breath consistent with chronic diastolic congestive heart failure.  I have personally reviewed his 2D echocardiogram, cardiac catheterization, and CTA studies.  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  There is moderate aortic insufficiency with a pressure half-time of 379 ms.  Left ventricular ejection fraction is 60 to 65%.  Cardiac catheterization shows mild obstructive coronary disease.  The mean gradient by cath was 30.4 mmHg and a peak to peak gradient of 31 mmHg.  I think aortic valve replacement is indicated in this patient for  relief of his symptoms and to prevent progressive left ventricular deterioration.  Given his age I think that transcatheter aortic valve replacement is the best treatment for him.  His gated cardiac CTA shows anatomy suitable for transcatheter aortic valve replacement using a 23 mm SAPIEN 3 valve.  His abdominal and pelvic CTA shows adequate pelvic vascular anatomy to allow transfemoral insertion.   The patient was counseled at length regarding treatment alternatives for management of severe symptomatic aortic stenosis. The risks and benefits of surgical intervention has been discussed in detail. Long-term prognosis with medical therapy was discussed. Alternative approaches such as conventional surgical aortic valve replacement, transcatheter aortic valve replacement, and palliative medical therapy were compared and contrasted at length. This discussion was placed in the context of the patient's own specific clinical presentation and past medical history. All of his questions have been addressed.    Following the decision to proceed with transcatheter aortic valve replacement, a discussion was held regarding what types of management strategies would be attempted intraoperatively in the event of life-threatening complications, including whether or not the patient would be considered a candidate for the use of cardiopulmonary bypass and/or conversion to open sternotomy for attempted surgical intervention.  I think he is a candidate for emergent sternotomy to  manage any intraoperative complications.The patient is aware of the fact that transient use of cardiopulmonary bypass may be necessary. The patient has been advised of a variety of complications that might develop including but not limited to risks of death, stroke, paravalvular leak, aortic dissection or other major vascular complications, aortic annulus rupture, device embolization, cardiac rupture or perforation, mitral regurgitation, acute myocardial  infarction, arrhythmia, heart block or bradycardia requiring permanent pacemaker placement, congestive heart failure, respiratory failure, renal failure, pneumonia, infection, other late complications related to structural valve deterioration or migration, or other complications that might ultimately cause a temporary or permanent loss of functional independence or other long term morbidity. The patient provides full informed consent for the procedure as described and all questions were answered.       Plan:   Transfemoral transcatheter aortic valve replacement using a SAPIEN 3 valve.

## 2021-04-02 ENCOUNTER — Inpatient Hospital Stay (HOSPITAL_COMMUNITY): Payer: Medicare Other | Admitting: Vascular Surgery

## 2021-04-02 ENCOUNTER — Other Ambulatory Visit: Payer: Self-pay | Admitting: Cardiology

## 2021-04-02 ENCOUNTER — Other Ambulatory Visit: Payer: Self-pay

## 2021-04-02 ENCOUNTER — Encounter (HOSPITAL_COMMUNITY): Payer: Self-pay | Admitting: Cardiovascular Disease

## 2021-04-02 ENCOUNTER — Encounter (HOSPITAL_COMMUNITY)
Admission: RE | Disposition: A | Discharge status: Home / Self Care | Payer: Self-pay | Source: Home / Self Care | Attending: Cardiovascular Disease

## 2021-04-02 ENCOUNTER — Inpatient Hospital Stay (HOSPITAL_COMMUNITY): Payer: Medicare Other | Admitting: Certified Registered Nurse Anesthetist

## 2021-04-02 ENCOUNTER — Ambulatory Visit (HOSPITAL_COMMUNITY)
Admission: RE | Admit: 2021-04-02 | Discharge: 2021-04-02 | Disposition: A | Discharge status: Home / Self Care | Payer: Medicare Other | Source: Ambulatory Visit | Attending: Cardiovascular Disease | Admitting: Cardiovascular Disease

## 2021-04-02 ENCOUNTER — Inpatient Hospital Stay (HOSPITAL_COMMUNITY)
Admission: RE | Admit: 2021-04-02 | Discharge: 2021-04-03 | DRG: 267 | Disposition: A | Discharge status: Home / Self Care | Payer: Medicare Other | Attending: Cardiovascular Disease | Admitting: Cardiovascular Disease

## 2021-04-02 DIAGNOSIS — Z952 Presence of prosthetic heart valve: Secondary | ICD-10-CM

## 2021-04-02 DIAGNOSIS — I352 Nonrheumatic aortic (valve) stenosis with insufficiency: Secondary | ICD-10-CM | POA: Diagnosis not present

## 2021-04-02 DIAGNOSIS — K4020 Bilateral inguinal hernia, without obstruction or gangrene, not specified as recurrent: Secondary | ICD-10-CM | POA: Diagnosis not present

## 2021-04-02 DIAGNOSIS — I429 Cardiomyopathy, unspecified: Secondary | ICD-10-CM | POA: Insufficient documentation

## 2021-04-02 DIAGNOSIS — I700 Atherosclerosis of aorta: Secondary | ICD-10-CM | POA: Diagnosis present

## 2021-04-02 DIAGNOSIS — Z7901 Long term (current) use of anticoagulants: Secondary | ICD-10-CM | POA: Diagnosis not present

## 2021-04-02 DIAGNOSIS — I447 Left bundle-branch block, unspecified: Secondary | ICD-10-CM

## 2021-04-02 DIAGNOSIS — Z91048 Other nonmedicinal substance allergy status: Secondary | ICD-10-CM | POA: Diagnosis not present

## 2021-04-02 DIAGNOSIS — I4819 Other persistent atrial fibrillation: Secondary | ICD-10-CM | POA: Diagnosis present

## 2021-04-02 DIAGNOSIS — Z8249 Family history of ischemic heart disease and other diseases of the circulatory system: Secondary | ICD-10-CM

## 2021-04-02 DIAGNOSIS — Z006 Encounter for examination for normal comparison and control in clinical research program: Secondary | ICD-10-CM

## 2021-04-02 DIAGNOSIS — F419 Anxiety disorder, unspecified: Secondary | ICD-10-CM | POA: Diagnosis not present

## 2021-04-02 DIAGNOSIS — R000 Tachycardia, unspecified: Secondary | ICD-10-CM | POA: Diagnosis present

## 2021-04-02 DIAGNOSIS — I428 Other cardiomyopathies: Secondary | ICD-10-CM | POA: Diagnosis present

## 2021-04-02 DIAGNOSIS — Z79899 Other long term (current) drug therapy: Secondary | ICD-10-CM

## 2021-04-02 DIAGNOSIS — Z87891 Personal history of nicotine dependence: Secondary | ICD-10-CM

## 2021-04-02 DIAGNOSIS — I2510 Atherosclerotic heart disease of native coronary artery without angina pectoris: Secondary | ICD-10-CM | POA: Diagnosis present

## 2021-04-02 DIAGNOSIS — I350 Nonrheumatic aortic (valve) stenosis: Secondary | ICD-10-CM

## 2021-04-02 DIAGNOSIS — I4891 Unspecified atrial fibrillation: Secondary | ICD-10-CM | POA: Diagnosis not present

## 2021-04-02 HISTORY — PX: TRANSCATHETER AORTIC VALVE REPLACEMENT, TRANSFEMORAL: SHX6400

## 2021-04-02 HISTORY — DX: Left bundle-branch block, unspecified: I44.7

## 2021-04-02 HISTORY — DX: Presence of prosthetic heart valve: Z95.2

## 2021-04-02 HISTORY — PX: CARDIAC VALVE REPLACEMENT: SHX585

## 2021-04-02 HISTORY — PX: TEE WITHOUT CARDIOVERSION: SHX5443

## 2021-04-02 LAB — POCT I-STAT, CHEM 8
BUN: 17 mg/dL (ref 8–23)
BUN: 18 mg/dL (ref 8–23)
BUN: 18 mg/dL (ref 8–23)
Calcium, Ion: 1.25 mmol/L (ref 1.15–1.40)
Calcium, Ion: 1.28 mmol/L (ref 1.15–1.40)
Calcium, Ion: 1.19 mmol/L (ref 1.15–1.40)
Chloride: 102 mmol/L (ref 98–111)
Chloride: 103 mmol/L (ref 98–111)
Chloride: 103 mmol/L (ref 98–111)
Creatinine, Ser: 0.7 mg/dL (ref 0.61–1.24)
Creatinine, Ser: 0.7 mg/dL (ref 0.61–1.24)
Creatinine, Ser: 0.7 mg/dL (ref 0.61–1.24)
Glucose, Bld: 143 mg/dL — ABNORMAL HIGH (ref 70–99)
Glucose, Bld: 151 mg/dL — ABNORMAL HIGH (ref 70–99)
Glucose, Bld: 141 mg/dL — ABNORMAL HIGH (ref 70–99)
HCT: 37 % — ABNORMAL LOW (ref 39.0–52.0)
HCT: 38 % — ABNORMAL LOW (ref 39.0–52.0)
HCT: 37 % — ABNORMAL LOW (ref 39.0–52.0)
Hemoglobin: 12.6 g/dL — ABNORMAL LOW (ref 13.0–17.0)
Hemoglobin: 12.9 g/dL — ABNORMAL LOW (ref 13.0–17.0)
Hemoglobin: 12.6 g/dL — ABNORMAL LOW (ref 13.0–17.0)
Potassium: 4.3 mmol/L (ref 3.5–5.1)
Potassium: 4.5 mmol/L (ref 3.5–5.1)
Potassium: 4.4 mmol/L (ref 3.5–5.1)
Sodium: 138 mmol/L (ref 135–145)
Sodium: 139 mmol/L (ref 135–145)
Sodium: 138 mmol/L (ref 135–145)
TCO2: 26 mmol/L (ref 22–32)
TCO2: 26 mmol/L (ref 22–32)
TCO2: 27 mmol/L (ref 22–32)

## 2021-04-02 LAB — POCT I-STAT 7, (LYTES, BLD GAS, ICA,H+H)
Acid-Base Excess: 0 mmol/L (ref 0.0–2.0)
Bicarbonate: 26.9 mmol/L (ref 20.0–28.0)
Calcium, Ion: 1.26 mmol/L (ref 1.15–1.40)
HCT: 39 % (ref 39.0–52.0)
Hemoglobin: 13.3 g/dL (ref 13.0–17.0)
O2 Saturation: 99 %
Potassium: 4.4 mmol/L (ref 3.5–5.1)
Sodium: 138 mmol/L (ref 135–145)
TCO2: 28 mmol/L (ref 22–32)
pCO2 arterial: 51.7 mmHg — ABNORMAL HIGH (ref 32.0–48.0)
pH, Arterial: 7.325 — ABNORMAL LOW (ref 7.350–7.450)
pO2, Arterial: 142 mmHg — ABNORMAL HIGH (ref 83.0–108.0)

## 2021-04-02 LAB — POCT ACTIVATED CLOTTING TIME
Activated Clotting Time: 138 seconds
Activated Clotting Time: 306 seconds
Activated Clotting Time: 121 seconds

## 2021-04-02 LAB — ECHOCARDIOGRAM LIMITED
AR max vel: 2.21 cm2
AV Area VTI: 3 cm2
AV Area mean vel: 2.52 cm2
AV Mean grad: 3 mmHg
AV Peak grad: 6.4 mmHg
Ao pk vel: 1.26 m/s

## 2021-04-02 SURGERY — IMPLANTATION, AORTIC VALVE, TRANSCATHETER, FEMORAL APPROACH
Anesthesia: Monitor Anesthesia Care

## 2021-04-02 MED ORDER — CHLORHEXIDINE GLUCONATE 4 % EX LIQD: 30.0000 mL | CUTANEOUS | Status: DC

## 2021-04-02 MED ORDER — SODIUM CHLORIDE 0.9 % IV SOLN: INTRAVENOUS | Status: DC

## 2021-04-02 MED ORDER — CHLORHEXIDINE GLUCONATE 4 % EX LIQD: 60.0000 mL | Freq: Once | CUTANEOUS | Status: DC

## 2021-04-02 MED ORDER — ONDANSETRON HCL 4 MG/2ML IJ SOLN: 4.0000 mg | Freq: Four times a day (QID) | INTRAMUSCULAR | Status: DC | PRN

## 2021-04-02 MED ORDER — PHENYLEPHRINE HCL-NACL 20-0.9 MG/250ML-% IV SOLN: 0.0000 ug/min | INTRAVENOUS | Status: DC

## 2021-04-02 MED ORDER — TRAMADOL HCL 50 MG PO TABS: 50.0000 mg | ORAL_TABLET | ORAL | Status: DC | PRN

## 2021-04-02 MED ORDER — CLOPIDOGREL BISULFATE 75 MG PO TABS
75.0000 mg | ORAL_TABLET | Freq: Every day | ORAL | Status: DC
  Administered 2021-04-03: 75 mg via ORAL
  Filled 2021-04-02: qty 1

## 2021-04-02 MED ORDER — SODIUM CHLORIDE 0.9% FLUSH: 3.0000 mL | INTRAVENOUS | Status: DC | PRN

## 2021-04-02 MED ORDER — HEPARIN (PORCINE) IN NACL 1000-0.9 UT/500ML-% IV SOLN
INTRAVENOUS | Status: DC | PRN
  Administered 2021-04-02 (×3): 500 mL

## 2021-04-02 MED ORDER — MORPHINE SULFATE (PF) 2 MG/ML IV SOLN: 1.0000 mg | INTRAVENOUS | Status: DC | PRN

## 2021-04-02 MED ORDER — PROTAMINE SULFATE 10 MG/ML IV SOLN
INTRAVENOUS | Status: DC | PRN
  Administered 2021-04-02: 120 mg via INTRAVENOUS

## 2021-04-02 MED ORDER — SODIUM CHLORIDE 0.9% FLUSH
3.0000 mL | Freq: Two times a day (BID) | INTRAVENOUS | Status: DC
  Administered 2021-04-03: 3 mL via INTRAVENOUS

## 2021-04-02 MED ORDER — ASPIRIN 81 MG PO CHEW
81.0000 mg | CHEWABLE_TABLET | Freq: Every day | ORAL | Status: DC
  Administered 2021-04-03: 81 mg via ORAL
  Filled 2021-04-02: qty 1

## 2021-04-02 MED ORDER — ACETAMINOPHEN 650 MG RE SUPP: 650.0000 mg | Freq: Four times a day (QID) | RECTAL | Status: DC | PRN

## 2021-04-02 MED ORDER — LIDOCAINE HCL (PF) 1 % IJ SOLN
INTRAMUSCULAR | Status: DC | PRN
  Administered 2021-04-02 (×2): 10 mL

## 2021-04-02 MED ORDER — HEPARIN SODIUM (PORCINE) 1000 UNIT/ML IJ SOLN
INTRAMUSCULAR | Status: DC | PRN
  Administered 2021-04-02: 12000 [IU] via INTRAVENOUS

## 2021-04-02 MED ORDER — HEPARIN (PORCINE) IN NACL 1000-0.9 UT/500ML-% IV SOLN
INTRAVENOUS | Status: AC
  Filled 2021-04-02: qty 500

## 2021-04-02 MED ORDER — SODIUM CHLORIDE 0.9 % IV SOLN: INTRAVENOUS | Status: AC

## 2021-04-02 MED ORDER — NITROGLYCERIN IN D5W 200-5 MCG/ML-% IV SOLN: 0.0000 ug/min | INTRAVENOUS | Status: DC

## 2021-04-02 MED ORDER — ONDANSETRON HCL 4 MG/2ML IJ SOLN
INTRAMUSCULAR | Status: DC | PRN
  Administered 2021-04-02: 4 mg via INTRAVENOUS

## 2021-04-02 MED ORDER — PROPOFOL 500 MG/50ML IV EMUL
INTRAVENOUS | Status: DC | PRN
  Administered 2021-04-02: 10 ug/kg/min via INTRAVENOUS

## 2021-04-02 MED ORDER — MIDAZOLAM HCL 5 MG/5ML IJ SOLN
INTRAMUSCULAR | Status: DC | PRN
  Administered 2021-04-02: 2 mg via INTRAVENOUS

## 2021-04-02 MED ORDER — SODIUM CHLORIDE 0.9 % IV SOLN
250.0000 mL | INTRAVENOUS | Status: DC | PRN
Start: 2021-04-02 — End: 2021-04-03

## 2021-04-02 MED ORDER — ACETAMINOPHEN 325 MG PO TABS
650.0000 mg | ORAL_TABLET | Freq: Four times a day (QID) | ORAL | Status: DC | PRN
  Administered 2021-04-02 – 2021-04-03 (×2): 650 mg via ORAL
  Filled 2021-04-02 (×2): qty 2

## 2021-04-02 MED ORDER — CHLORHEXIDINE GLUCONATE 0.12 % MT SOLN
15.0000 mL | Freq: Once | OROMUCOSAL | Status: AC
  Administered 2021-04-02: 15 mL via OROMUCOSAL
  Filled 2021-04-02 (×2): qty 15

## 2021-04-02 MED ORDER — LACTATED RINGERS IV SOLN: INTRAVENOUS | Status: DC

## 2021-04-02 MED ORDER — CEFAZOLIN SODIUM-DEXTROSE 2-4 GM/100ML-% IV SOLN
2.0000 g | Freq: Three times a day (TID) | INTRAVENOUS | Status: AC
  Administered 2021-04-02 (×2): 2 g via INTRAVENOUS
  Filled 2021-04-02 (×2): qty 100

## 2021-04-02 MED ORDER — OXYCODONE HCL 5 MG PO TABS: 5.0000 mg | ORAL_TABLET | ORAL | Status: DC | PRN

## 2021-04-02 MED ORDER — IOHEXOL 350 MG/ML SOLN
INTRAVENOUS | Status: DC | PRN
  Administered 2021-04-02: 40 mL

## 2021-04-02 MED ORDER — SODIUM CHLORIDE 0.9 % IV SOLN
250.0000 mL | INTRAVENOUS | Status: DC
Start: 2021-04-02 — End: 2021-04-03

## 2021-04-02 MED ORDER — LIDOCAINE HCL (PF) 1 % IJ SOLN
INTRAMUSCULAR | Status: AC
  Filled 2021-04-02: qty 30

## 2021-04-02 MED ORDER — FENTANYL CITRATE (PF) 100 MCG/2ML IJ SOLN
INTRAMUSCULAR | Status: DC | PRN
  Administered 2021-04-02: 25 ug via INTRAVENOUS

## 2021-04-02 SURGICAL SUPPLY — 31 items
BAG SNAP BAND KOVER 36X36 (MISCELLANEOUS) ×6 IMPLANT
CABLE ADAPT PACING TEMP 12FT (ADAPTER) ×1 IMPLANT
CATH 23 ULTRA DELIVERY (CATHETERS) ×1 IMPLANT
CATH DIAG 6FR PIGTAIL ANGLED (CATHETERS) ×2 IMPLANT
CATH INFINITI 6F AL2 (CATHETERS) ×1 IMPLANT
CATH S G BIP PACING (CATHETERS) ×1 IMPLANT
CLOSURE MYNX CONTROL 6F/7F (Vascular Products) ×1 IMPLANT
CLOSURE PERCLOSE PROSTYLE (VASCULAR PRODUCTS) ×2 IMPLANT
CRIMPER (MISCELLANEOUS) ×1 IMPLANT
DEVICE INFLATION ATRION QL2530 (MISCELLANEOUS) ×1 IMPLANT
ELECT DEFIB PAD ADLT CADENCE (PAD) ×1 IMPLANT
GUIDEWIRE SAFE TJ AMPLATZ EXST (WIRE) ×1 IMPLANT
KIT HEART LEFT (KITS) ×3 IMPLANT
KIT MICROPUNCTURE NIT STIFF (SHEATH) ×1 IMPLANT
PACK CARDIAC CATHETERIZATION (CUSTOM PROCEDURE TRAY) ×3 IMPLANT
SHEATH BRITE TIP 7FR 35CM (SHEATH) ×1 IMPLANT
SHEATH EDWARDS INTRO SET 14X36 (SHEATH) ×1 IMPLANT
SHEATH PINNACLE 6F 10CM (SHEATH) ×1 IMPLANT
SHEATH PINNACLE 8F 10CM (SHEATH) ×1 IMPLANT
SHEATH PROBE COVER 6X72 (BAG) ×1 IMPLANT
SLEEVE REPOSITIONING LENGTH 30 (MISCELLANEOUS) ×1 IMPLANT
STOPCOCK MORSE 400PSI 3WAY (MISCELLANEOUS) ×6 IMPLANT
SYR MEDRAD MARK V 150ML (SYRINGE) ×1 IMPLANT
TRANSDUCER W/STOPCOCK (MISCELLANEOUS) ×6 IMPLANT
TUBING CONTRAST HIGH PRESS 48 (TUBING) ×1 IMPLANT
VALVE 23 ULTRA SAPIEN KIT (Valve) ×1 IMPLANT
WIRE AMPLATZ SS-J .035X180CM (WIRE) ×1 IMPLANT
WIRE EMERALD 3MM-J .035X150CM (WIRE) ×1 IMPLANT
WIRE EMERALD 3MM-J .035X260CM (WIRE) ×1 IMPLANT
WIRE EMERALD ST .035X260CM (WIRE) ×1 IMPLANT
WIRE J 3MM .035X145CM (WIRE) ×1 IMPLANT

## 2021-04-02 NOTE — CV Procedure (Signed)
[facility]  TAVR OPERATIVE NOTE   Date of Procedure:  04/02/2021  Preoperative Diagnosis: Severe Aortic Stenosis   Postoperative Diagnosis: Same   Procedure:   Transcatheter Aortic Valve Replacement - Transfemoral Approach  Edwards Sapien 3 THV (size 23 mm, model # [id number], serial # [id number])   Co-Surgeons:  Lauree Chandler, MD and Gaye Pollack, MD   Anesthesiologist:  Smith Robert  Echocardiographer:  Croitoru  Pre-operative Echo Findings: Severe aortic stenosis Normal left ventricular systolic function  Post-operative Echo Findings: No paravalvular leak Normal left ventricular systolic function  BRIEF CLINICAL NOTE AND INDICATIONS FOR SURGERY  75 yo male with history of non-ischemic cardiomyopathy, former tobacco abuse, persistent atrial fibrillation, aortic stenosis, mild aortic insufficiency and CAD who is here today for TAVR. Echo in January 2018 with LVEF=35-40%. Cardiac cath March 2018 with mild CAD and normal LVEF. He has persistent atrial fibrillation and is on Eliquis and Coreg. Echo August 2021 with LVEF=55-60%, moderately severe AS with mean gradient 28.5 mmHg, peak gradient 50.6 mmHg, AVA 0.77 cm2 and dimensionless index 0.24. mild to moderate AI. I saw him in the office in August 2021 and he was asymptomatic. Echo 08/16/20 with LVEF=60-65%. Mild to moderate MR. Moderately severe aortic stenosis with mean gradient 28.28mmHg, peak gradient 49.5 mmHg, AVA 0.7 cm2, dimensionless index 0.26. No change in LV dimensions. Repeat echo 01/23/21 with LVEF=60-65%. His aortic stenosis has worsened. Now with mean gradient 35.3 mmHg, peak gradient 56 mmHg, AVA 0.65 cm2, dimensionless index 0.25. He tells me that he has had progressive fatigue and dyspnea on exertion. No chest pain, dizziness, near syncope or syncope. Cardiac cath with mild non-obstructive CAD. CT scans show favorable anatomy for transfemoral access for TAVR.   During the course of the patient's  preoperative work up they have been evaluated comprehensively by a multidisciplinary team of specialists coordinated through the [facility] in the [facility].  They have been demonstrated to suffer from symptomatic severe aortic stenosis as noted above. The patient has been counseled extensively as to the relative risks and benefits of all options for the treatment of severe aortic stenosis including long term medical therapy, conventional surgery for aortic valve replacement, and transcatheter aortic valve replacement.  The patient has been independently evaluated by Dr. Cyndia Bent with CT surgery and they are felt to be at high risk for conventional surgical aortic valve replacement. The surgeon indicated the patient would be a poor candidate for conventional surgery. Based upon review of all of the patient's preoperative diagnostic tests they are felt to be candidate for transcatheter aortic valve replacement using the transfemoral approach as an alternative to high risk conventional surgery.    Following the decision to proceed with transcatheter aortic valve replacement, a discussion has been held regarding what types of management strategies would be attempted intraoperatively in the event of life-threatening complications, including whether or not the patient would be considered a candidate for the use of cardiopulmonary bypass and/or conversion to open sternotomy for attempted surgical intervention.  The patient has been advised of a variety of complications that might develop peculiar to this approach including but not limited to risks of death, stroke, paravalvular leak, aortic dissection or other major vascular complications, aortic annulus rupture, device embolization, cardiac rupture or perforation, acute myocardial infarction, arrhythmia, heart block or bradycardia requiring permanent pacemaker placement, congestive heart failure, respiratory failure,  renal failure, pneumonia, infection, other late complications related to structural valve deterioration or migration, or  other complications that might ultimately cause a temporary or permanent loss of functional independence or other long term morbidity.  The patient provides full informed consent for the procedure as described and all questions were answered preoperatively.    DETAILS OF THE OPERATIVE PROCEDURE  PREPARATION:   The patient is brought to the operating room on the above mentioned date and central monitoring was established by the anesthesia team including placement of a radial arterial line. The patient is placed in the supine position on the operating table.  Intravenous antibiotics are administered. Conscious sedation is used.   Baseline transthoracic echocardiogram was performed. The patient's chest, abdomen, both groins, and both lower extremities are prepared and draped in a sterile manner. A time out procedure is performed.   PERIPHERAL ACCESS:   Using the modified Seldinger technique, femoral arterial and venous access were obtained with placement of a 6 Fr sheath in the artery and a 7 Fr sheath in the vein on the left side using u/s guidance.  A pigtail diagnostic catheter was passed through the femoral arterial sheath under fluoroscopic guidance into the aortic root.  A temporary transvenous pacemaker catheter was passed through the femoral venous sheath under fluoroscopic guidance into the right ventricle.  The pacemaker was tested to ensure stable lead placement and pacemaker capture. Aortic root angiography was performed in order to determine the optimal angiographic angle for valve deployment.  TRANSFEMORAL ACCESS:  A micropuncture kit was used to gain access to the right femoral artery using u/s guidance. Position confirmed with angiography. Pre-closure with double ProGlide closure devices. The patient was heparinized systemically and ACT verified > 250 seconds.    A  14 Fr transfemoral E-sheath was introduced into the right femoral artery after progressively dilating over an Amplatz superstiff wire. An AL-2 catheter was used to direct a straight-tip exchange length wire across the native aortic valve into the left ventricle. This was exchanged out for a pigtail catheter and position was confirmed in the LV apex. Simultaneous LV and Ao pressures were recorded.  The pigtail catheter was then exchanged for an Amplatz Extra-stiff wire in the LV apex.   TRANSCATHETER HEART VALVE DEPLOYMENT:  An Edwards Sapien 3 THV (size 23 mm) was prepared and crimped per manufacturer's guidelines, and the proper orientation of the valve is confirmed on the [employer] delivery system. The valve was advanced through the introducer sheath using normal technique until in an appropriate position in the abdominal aorta beyond the sheath tip. The balloon was then retracted and using the fine-tuning wheel was centered on the valve. The valve was then advanced across the aortic arch using appropriate flexion of the catheter. The valve was carefully positioned across the aortic valve annulus. The Commander catheter was retracted using normal technique. Once final position of the valve has been confirmed by angiographic assessment, the valve is deployed while temporarily holding ventilation and during rapid ventricular pacing to maintain systolic blood pressure < 50 mmHg and pulse pressure < 10 mmHg. The balloon inflation is held for >3 seconds after reaching full deployment volume. Once the balloon has fully deflated the balloon is retracted into the ascending aorta and valve function is assessed using TTE. There is felt to be no paravalvular leak and no central aortic insufficiency.  The patient's hemodynamic recovery following valve deployment is good.  The deployment balloon and guidewire are both removed. Echo demostrated acceptable post-procedural gradients, stable mitral valve function, and  no AI.   PROCEDURE COMPLETION:  The sheath  was then removed and closure devices were completed. Protamine was administered once femoral arterial repair was complete. The temporary pacemaker, pigtail catheters and femoral sheaths were removed with a Mynx closure device placed in the artery and manual pressure used for venous hemostasis.    The patient tolerated the procedure well and is transported to the surgical intensive care in stable condition. There were no immediate intraoperative complications. All sponge instrument and needle counts are verified correct at completion of the operation.   No blood products were administered during the operation.  The patient received a total of 40 mL of intravenous contrast during the procedure.  Lauree Chandler MD 04/02/2021 10:49 AM

## 2021-04-02 NOTE — Progress Notes (Signed)
  [place] VALVE TEAM  Patient doing well s/p TAVR. He is hemodynamically stable. Groin sites stable. ECG with afib and new LBBB. There is no high grade block. There was one  short pause, ~1.8 seconds. Continue to monitor. Arterial line discontinued and transferred to 4E. Plan for early ambulation after bedrest completed and hopeful discharge over the next 24-48 hours.   Angelena Form PA-C  [facility]  Pager [phone]

## 2021-04-02 NOTE — Progress Notes (Signed)
Mobility Specialist Progress Note    04/02/21 1508  Mobility  Activity Ambulated in hall  Level of Assistance Modified independent, requires aide device or extra time  Assistive Device Front wheel walker  Distance Ambulated (ft) 442 ft  Mobility Ambulated with assistance in hallway  Mobility Response Tolerated well  Mobility performed by Mobility specialist   Pre-Mobility: 65 HR, 110/92 BP, 100% SpO2 Post-Mobility: 69 HR, 107/88 BP, 99% SpO2  Pt received in bed and willing to ambulate. Pt c/o being a little light headed upon sitting up but otherwise asx throughout ambulation. Had successful void when returned and sat up in bed with call bell in reach and family members present in room.   Hildred Alamin Mobility Specialist  Mobility Specialist Phone: [phone]

## 2021-04-02 NOTE — Transfer of Care (Signed)
Immediate Anesthesia Transfer of Care Note  Patient: Nicholas Sutton  Procedure(s) Performed: TRANSCATHETER AORTIC VALVE REPLACEMENT, TRANSFEMORAL TRANSESOPHAGEAL ECHOCARDIOGRAM (TEE)  Patient Location: Cath Lab  Anesthesia Type:MAC  Level of Consciousness: drowsy  Airway & Oxygen Therapy: Patient Spontanous Breathing and Patient connected to face mask oxygen  Post-op Assessment: Report given to RN and Post -op Vital signs reviewed and stable  Post vital signs: Reviewed and stable  Last Vitals:  Vitals Value Taken Time  BP 95/57 04/02/21 1050  Temp    Pulse 81 04/02/21 1049  Resp 14 04/02/21 1051  SpO2 99 % 04/02/21 1050  Vitals shown include unvalidated device data.  Last Pain:  Vitals:   04/02/21 0748  TempSrc:   PainSc: 0-No pain         Complications: No notable events documented.

## 2021-04-02 NOTE — Anesthesia Preprocedure Evaluation (Addendum)
Anesthesia Evaluation  Patient identified by MRN, date of birth, ID band Patient awake    Reviewed: Allergy & Precautions, NPO status , Patient's Chart, lab work & pertinent test results  Airway Mallampati: II  TM Distance: >3 FB Neck ROM: Full    Dental  (+) Teeth Intact, Dental Advisory Given   Pulmonary former smoker,    breath sounds clear to auscultation       Cardiovascular + CAD  + dysrhythmias Atrial Fibrillation + Valvular Problems/Murmurs AS  Rhythm:Regular Rate:Normal + Systolic murmurs Echo: 1. Left ventricular ejection fraction, by estimation, is 60 to 65%. The  left ventricle has normal function. The left ventricle has no regional  wall motion abnormalities. Left ventricular diastolic parameters are  indeterminate.  2. Right ventricular systolic function is normal. The right ventricular  size is normal. There is normal pulmonary artery systolic pressure.  3. Left atrial size was mildly dilated.  4. Right atrial size was moderately dilated.  5. The mitral valve is abnormal. No evidence of mitral valve  regurgitation. No evidence of mitral stenosis.  6. Since TTE done 08/16/20 mean gradient increased from 28.7 to 35.3 mmHg  and peak from 49.5 to 56 mmHg    Neuro/Psych  Headaches, Anxiety    GI/Hepatic negative GI ROS, Neg liver ROS,   Endo/Other  negative endocrine ROS  Renal/GU negative Renal ROS     Musculoskeletal negative musculoskeletal ROS (+)   Abdominal Normal abdominal exam  (+)   Peds  Hematology negative hematology ROS (+)   Anesthesia Other Findings   Reproductive/Obstetrics                           Anesthesia Physical Anesthesia Plan  ASA: 4  Anesthesia Plan: MAC   Post-op Pain Management:    Induction: Intravenous  PONV Risk Score and Plan: 0 and Propofol infusion and Ondansetron  Airway Management Planned: Natural Airway and Simple Face  Mask  Additional Equipment: Arterial line  Intra-op Plan:   Post-operative Plan:   Informed Consent: I have reviewed the patients History and Physical, chart, labs and discussed the procedure including the risks, benefits and alternatives for the proposed anesthesia with the patient or authorized representative who has indicated his/her understanding and acceptance.     Dental advisory given  Plan Discussed with: CRNA  Anesthesia Plan Comments:        Anesthesia Quick Evaluation

## 2021-04-02 NOTE — Anesthesia Postprocedure Evaluation (Signed)
Anesthesia Post Note  Patient: Nicholas Sutton  Procedure(s) Performed: TRANSCATHETER AORTIC VALVE REPLACEMENT, TRANSFEMORAL TRANSESOPHAGEAL ECHOCARDIOGRAM (TEE)     Patient location during evaluation: PACU Anesthesia Type: MAC Level of consciousness: awake and alert Pain management: pain level controlled Vital Signs Assessment: post-procedure vital signs reviewed and stable Respiratory status: spontaneous breathing, nonlabored ventilation, respiratory function stable and patient connected to nasal cannula oxygen Cardiovascular status: stable and blood pressure returned to baseline Postop Assessment: no apparent nausea or vomiting Anesthetic complications: no   No notable events documented.  Last Vitals:  Vitals:   04/02/21 1245 04/02/21 1300  BP: 112/76 105/79  Pulse: (!) 59 72  Resp: 14 17  Temp:    SpO2: 96% 100%    Last Pain:  Vitals:   04/02/21 1218  TempSrc:   PainSc: 0-No pain                 Effie Berkshire

## 2021-04-02 NOTE — Progress Notes (Signed)
LOCATION: right RADIAL a-line removed  A-line removed at 1143, manual pressure applied for approximately 10 minutes, gauze with tegaderm placed  SITE UPON ARRIVAL: LEVEL 0  SITE AFTER BAND REMOVAL: LEVEL 0  CIRCULATION SENSATION AND MOVEMENT: +2 bilateral radial pulses  Post removal instruction given

## 2021-04-02 NOTE — Op Note (Signed)
[facility]   MULTIDISCIPLINARY HEART VALVE TEAM   TAVR OPERATIVE NOTE   Date of Procedure:  04/02/2021  Preoperative Diagnosis: Severe Aortic Stenosis   Postoperative Diagnosis: Same   Procedure:   Transcatheter Aortic Valve Replacement - Percutaneous Right Transfemoral Approach  Edwards Sapien 3 Ultra THV (size 23 mm, model # 9750TFX, serial # [id number])   Co-Surgeons:  Gaye Pollack, MD and Lauree Chandler, MD   Anesthesiologist:  Suella Broad, MD  Echocardiographer:  Jerilynn Mages. Croitoru, MD  Pre-operative Echo Findings: Severe aortic stenosis Normal left ventricular systolic function  Post-operative Echo Findings: No paravalvular leak Normal left ventricular systolic function   BRIEF CLINICAL NOTE AND INDICATIONS FOR SURGERY  This 75 year old gentleman has stage D, severe, symptomatic aortic stenosis with New York Heart Association class II symptoms of exertional fatigue and shortness of breath consistent with chronic diastolic congestive heart failure.  I have personally reviewed his 2D echocardiogram, cardiac catheterization, and CTA studies.  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  There is moderate aortic insufficiency with a pressure half-time of 379 ms.  Left ventricular ejection fraction is 60 to 65%.  Cardiac catheterization shows mild obstructive coronary disease.  The mean gradient by cath was 30.4 mmHg and a peak to peak gradient of 31 mmHg.  I think aortic valve replacement is indicated in this patient for relief of his symptoms and to prevent progressive left ventricular deterioration.  Given his age I think that transcatheter aortic valve replacement is the best treatment for him.  His gated cardiac CTA shows anatomy suitable for  transcatheter aortic valve replacement using a 23 mm SAPIEN 3 valve.  His abdominal and pelvic CTA shows adequate pelvic vascular anatomy to allow transfemoral insertion.   The patient was counseled at length regarding treatment alternatives for management of severe symptomatic aortic stenosis. The risks and benefits of surgical intervention has been discussed in detail. Long-term prognosis with medical therapy was discussed. Alternative approaches such as conventional surgical aortic valve replacement, transcatheter aortic valve replacement, and palliative medical therapy were compared and contrasted at length. This discussion was placed in the context of the patient's own specific clinical presentation and past medical history. All of his questions have been addressed.    Following the decision to proceed with transcatheter aortic valve replacement, a discussion was held regarding what types of management strategies would be attempted intraoperatively in the event of life-threatening complications, including whether or not the patient would be considered a candidate for the use of cardiopulmonary bypass and/or conversion to open sternotomy for attempted surgical intervention.  I think he is a candidate for emergent sternotomy to manage any intraoperative complications.The patient is aware of the fact that transient use of cardiopulmonary bypass may be necessary. The patient has been advised of a variety of complications that might develop including but not limited to risks of death, stroke, paravalvular leak, aortic dissection or other major vascular complications, aortic annulus rupture, device embolization, cardiac rupture or perforation, mitral regurgitation, acute myocardial infarction, arrhythmia, heart block or bradycardia requiring permanent pacemaker placement, congestive heart failure, respiratory failure, renal failure, pneumonia, infection, other late complications related to structural valve  deterioration or migration, or other complications that might ultimately cause a temporary or permanent loss of functional independence or  other long term morbidity. The patient provides full informed consent for the procedure as described and all questions were answered.     DETAILS OF THE OPERATIVE PROCEDURE  PREPARATION:    The patient was brought to the operating room on the above mentioned date and appropriate monitoring was established by the anesthesia team. The patient was placed in the supine position on the operating table.  Intravenous antibiotics were administered. The patient was monitored closely throughout the procedure under conscious sedation.   Baseline transthoracic echocardiogram was performed. The patient's abdomen and both groins were prepped and draped in a sterile manner. A time out procedure was performed.   PERIPHERAL ACCESS:    Using the modified Seldinger technique, femoral arterial and venous access was obtained with placement of 6 Fr sheaths on the left side.  A pigtail diagnostic catheter was passed through the left arterial sheath under fluoroscopic guidance into the aortic root.  A temporary transvenous pacemaker catheter was passed through the left femoral venous sheath under fluoroscopic guidance into the right ventricle.  The pacemaker was tested to ensure stable lead placement and pacemaker capture. Aortic root angiography was performed in order to determine the optimal angiographic angle for valve deployment.   TRANSFEMORAL ACCESS:   Percutaneous transfemoral access and sheath placement was performed using ultrasound guidance.  The right common femoral artery was cannulated using a micropuncture needle and appropriate location was verified using hand injection angiogram.  A pair of Abbott Perclose percutaneous closure devices were placed and a 6 French sheath replaced into the femoral artery.  The patient was heparinized systemically and ACT verified > 250  seconds.    A 14 Fr transfemoral E-sheath was introduced into the right common femoral artery after progressively dilating over an Amplatz superstiff wire. An AL-2 catheter was used to direct a straight-tip exchange length wire across the native aortic valve into the left ventricle. This was exchanged out for a pigtail catheter and position was confirmed in the LV apex. Simultaneous LV and Ao pressures were recorded.  The pigtail catheter was exchanged for an Amplatz Extra-stiff wire in the LV apex.   BALLOON AORTIC VALVULOPLASTY:   Not performed   TRANSCATHETER HEART VALVE DEPLOYMENT:   An Edwards Sapien 3 Ultra transcatheter heart valve (size 23 mm) was prepared and crimped per manufacturer's guidelines, and the proper orientation of the valve is confirmed on the [employer] delivery system. The valve was advanced through the introducer sheath using normal technique until in an appropriate position in the abdominal aorta beyond the sheath tip. The balloon was then retracted and using the fine-tuning wheel was centered on the valve. The valve was then advanced across the aortic arch using appropriate flexion of the catheter. The valve was carefully positioned across the aortic valve annulus. The Commander catheter was retracted using normal technique. Once final position of the valve has been confirmed by angiographic assessment, the valve is deployed while temporarily holding ventilation and during rapid ventricular pacing to maintain systolic blood pressure < 50 mmHg and pulse pressure < 10 mmHg. The balloon inflation is held for >3 seconds after reaching full deployment volume. Once the balloon has fully deflated the balloon is retracted into the ascending aorta and valve function is assessed using echocardiography. There is felt to be no paravalvular leak and no central aortic insufficiency.  The patient's hemodynamic recovery following valve deployment is good.  The deployment balloon and  guidewire are both removed.    PROCEDURE COMPLETION:   The  sheath was removed and femoral artery closure performed.  Protamine was administered once femoral arterial repair was complete. The temporary pacemaker, pigtail catheters and femoral sheaths were removed with manual pressure used for hemostasis.  A Mynx femoral closure device was utilized following removal of the diagnostic sheath in the left femoral artery.  The patient tolerated the procedure well and is transported to the cath lab recovery area in stable condition. There were no immediate intraoperative complications. All sponge instrument and needle counts are verified correct at completion of the operation.   No blood products were administered during the operation.  The patient received a total of 40 mL of intravenous contrast during the procedure.   Gaye Pollack, MD 04/02/2021

## 2021-04-02 NOTE — Anesthesia Procedure Notes (Signed)
Arterial Line Insertion Start/End9/13/2022 8:50 AM Performed by: Carolan Clines, CRNA, CRNA  Patient location: Pre-op. Preanesthetic checklist: patient identified, IV checked, site marked, risks and benefits discussed, surgical consent, monitors and equipment checked, pre-op evaluation, timeout performed and anesthesia consent Lidocaine 1% used for infiltration Right, radial was placed Catheter size: 20 G Hand hygiene performed  and maximum sterile barriers used   Attempts: 1 Procedure performed without using ultrasound guided technique. Following insertion, dressing applied and Biopatch. Post procedure assessment: normal and unchanged  Patient tolerated the procedure well with no immediate complications.

## 2021-04-02 NOTE — Discharge Summary (Addendum)
[facility] TE[facility]  Discharge Summary    Patient ID: Nicholas Sutton MRN: [id number]; DOB: Jun 11, 1946  Admit date: 04/02/2021 Discharge date: 04/03/2021  Primary Care Provider: Redmond School, MD  Primary Cardiologist: Lauree Chandler, MD & Dr. Arvid Right, MD   Discharge Diagnoses    Principal Problem:   S/P TAVR (transcatheter aortic valve replacement) Active Problems:   Persistent atrial fibrillation (HCC)   NICM (nonischemic cardiomyopathy) ([place])   Severe aortic stenosis   New onset left bundle branch block (LBBB)  Allergies Allergies  Allergen Reactions   Dust Mite Extract Other (See Comments)    Sneezing   Pollen Extract Other (See Comments)    Sneezing   Diagnostic Studies/Procedures    04/02/21: TAVR OPERATIVE NOTE     Date of Procedure:                04/02/2021   Preoperative Diagnosis:      Severe Aortic Stenosis    Postoperative Diagnosis:    Same    Procedure:        Transcatheter Aortic Valve Replacement - Percutaneous Right Transfemoral Approach             Edwards Sapien 3 Ultra THV (size 23 mm, model # 9750TFX, serial # [id number])              Co-Surgeons:                        Gaye Pollack, MD and Lauree Chandler, MD     Anesthesiologist:                  Suella Broad, MD   Echocardiographer:              Bertrum Sol, MD   Pre-operative Echo Findings: Severe aortic stenosis Normal left ventricular systolic function   Post-operative Echo Findings: No paravalvular leak Normal left ventricular systolic function  --------------------------------  Echo 04/03/21: completed 04/03/21 but pending formal read at the time of discharge   History of Present Illness     Nicholas Sutton is a 75 y.o. male with a history of non-ischemic cardiomyopathy with LV normalization, former tobacco abuse, persistent atrial fibrillation on AC with Eliquis, severe aortic stenosis now s/p TAVR with  Edwards Sapien 3 Ultra 23 mm, mild aortic insufficiency and non-obstructive CAD who presented to [facility] on 04/02/21 for planned TAVR.   Mr. Deragon has been followed by Dr. Saunders Revel. Echo 07/2016 showed an LVEF at 35-40% with subsequent LHC showing mild CAD and normal LVEF. He has persistent atrial fibrillation anticoagulated with Eliquis and Coreg. Repeat echocardiogram from 02/2020 showed an LVEF at 55-60%, moderately severe AS with mean gradient 28.5 mmHg, peak gradient 50.6 mmHg, AVA 0.77 cm2 and dimensionless index 0.24 and mild to moderate AI. [facility] was contacted for pre-operative clearance prior to hernia repair. At that time he reported a change in symptoms with increased fatigue. His follow up appointment was moved forward and he was seen by Dr. Angelena Form. Repeat echo was performed which unfortunately showed an LVEF at 60-65% however his aortic stenosis had worsened with a mean gradient 35.3 mmHg, peak gradient 56 mmHg, AVA 0.65 cm2, dimensionless index 0.25.    The patient has been evaluated by the multidisciplinary valve team and felt to have severe, symptomatic aortic stenosis and to be a suitable candidate for TAVR, which was set up for 04/02/21.    Hospital Course  1. Severe AS: s/p successful TAVR with a 23 mm Edwards Sapien 3 Ultra THV via the TF approach on 04/02/21. The patient tolerated the procedure well with no intra or post procedural complications. Post TAVR EKG showed atrial fibrillation with new LBBB and no high grade block. There was one short pause ~1.8 second pause. Plan is to place ZIO patch prior to discharge. He will resume home Eliquis today and was started on Asprin '81mg'$ . He was tachycardic on day of discharge therefore his carvedilol was restarted.   3. New LBBB: Pt developed new LBBB in the post procedural setting. He has been asymptomatic. Will place ZIO prior to hospital discharge and follow results in the OP setting.   4. Tachycardia/Atrial fibrillation: Tachycardic with HR's  in the 100-120's however carvedilol held post TAVR. Will restart today at PTA dose. He is on Eliquis for anticoagulation which we will resume.   5. 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 Recommend follow-up US every 3 years. Plan to follow with primary cardiologist.    Consultants: None    The patient was seen and examined by Dr. Angelena Form who feels that the patient is stable and ready for discharge today, 04/03/21. Follow up appointments and echocardiogram have been made. She has been seen by cardiac rehab.   General: Well developed, well nourished, NAD Neck: Negative for carotid bruits. No JVD Lungs:Clear to ausculation bilaterally. Cardiovascular: Irregularly irregular.  No murmur Extremities: No edema. Radial and DT pulses 2+ bilaterally. Groin sites with no hematoma.  Neuro: Alert and oriented. No focal deficits. No facial asymmetry. MAE spontaneously. Psych: Responds to questions appropriately with normal affect.   _____________  Discharge Vitals Blood pressure 129/90, pulse 100, temperature 98.1 F (36.7 C), temperature source Oral, resp. rate 19, height 6' (1.829 m), weight 73.5 kg, SpO2 95 %.  Filed Weights   04/02/21 0737 04/03/21 0616  Weight: 73.5 kg 73.5 kg   Labs & Radiologic Studies    CBC Recent Labs    04/02/21 1054 04/03/21 0211  WBC  --  10.0  HGB 12.6* 13.1  HCT 37.0* 38.7*  MCV  --  89.8  PLT  --  99991111*   Basic Metabolic Panel Recent Labs    04/02/21 1054 04/03/21 0211  NA 138 136  K 4.4 4.1  CL 103 100  CO2  --  25  GLUCOSE 141* 102*  BUN 18 12  CREATININE 0.70 0.83  CALCIUM  --  8.8*   Liver Function Tests No results for input(s): AST, ALT, ALKPHOS, BILITOT, PROT, ALBUMIN in the last 72 hours. No results for input(s): LIPASE, AMYLASE in the last 72 hours. Cardiac Enzymes No results for input(s): CKTOTAL, CKMB, CKMBINDEX, TROPONINI in the last 72 hours. BNP Invalid input(s):  POCBNP D-Dimer No results for input(s): DDIMER in the last 72 hours. Hemoglobin A1C No results for input(s): HGBA1C in the last 72 hours. Fasting Lipid Panel No results for input(s): CHOL, HDL, LDLCALC, TRIG, CHOLHDL, LDLDIRECT in the last 72 hours. Thyroid Function Tests No results for input(s): TSH, T4TOTAL, T3FREE, THYROIDAB in the last 72 hours.  Invalid input(s): FREET3 _____________  DG Chest 2 View  Result Date: 04/01/2021 CLINICAL DATA:  Preadmit TAVR. EXAM: CHEST - 2 VIEW COMPARISON:  08/27/2017 FINDINGS: Heart and mediastinal contours are within normal limits. No focal opacities or effusions. No acute bony abnormality. IMPRESSION: No active cardiopulmonary disease. Electronically Signed   By: Rolm Baptise M.D.  On: 04/01/2021 08:25   ECHOCARDIOGRAM LIMITED  Result Date: 04/02/2021    ECHOCARDIOGRAM LIMITED REPORT   Patient Name:   SANDFORD BADGLEY Date of Exam: 04/02/2021 Medical Rec #:  [id number]        Height:       72.0 in Accession #:    [id number]       Weight:       162.0 lb Date of Birth:  27-Dec-1945       BSA:          1.948 m Patient Age:    75 years         BP:           131/83 mmHg Patient Gender: M                HR:           79 bpm. Exam Location:  Inpatient Procedure: Limited Echo, Cardiac Doppler and Color Doppler Indications:     Aortic stenosis  History:         Patient has prior history of Echocardiogram examinations, most                  recent 01/23/2021. Cardiomyopathy, Aortic Valve Disease and                  Moderate aortic stenosis; Arrythmias:Atrial Fibrillation.  Sonographer:     Dustin Flock RDCS Referring Phys:  [id number] Burnell Blanks Diagnosing Phys: Sanda Klein MD                   PREOPERATIVE FINDINGS:                   Normal left ventricular systolic function and wall motion, EF                  50%.                  Trileaflet aortic valve with severe calcific stenosis.                  Aortic valve area 0.62 cm (0.32 cm/m indexed for  BSA), mean                  gradient 22 mmHg, Vmax 3.18 m/s, dimensionless index 0.19.                  Mild aortic insufficiency.                  Mild mitral insufficiency.                  No tricuspid insufficiency.                  No pericardial effusion.                   POSTOPERATIVE FINDINGS:                   Normal left ventricular systolic function and normal wall                  motion, EF 55%.                  Well seated Edwards S3U TAVR stent valve without perivalvular                  leak.  Aortic valve area 3.0 cm (1.54 cm2/m2 BSA), mean gradient 2                  mmHg, Vmax 1.26 m/s, dimensionless index 0.70, acceleration                  time 64 ms.                  No aortic insufficiency.                  Mild mitral insufficiency.                  No tricuspid insufficiency.                   No pericardial effusion.                   Gradients are likely underestimated on both pre and post                  procedure images, due to difficulty with Doppler beam                  alignment. IMPRESSIONS  1. Left ventricular ejection fraction, by estimation, is 50 to 55%. The left ventricle has low normal function. The left ventricle demonstrates global hypokinesis.  2. Right ventricular systolic function is normal. The right ventricular size is normal.  3. The mitral valve is normal in structure. Mild mitral valve regurgitation.  4. The aortic valve is tricuspid. There is severe calcifcation of the aortic valve. There is severe thickening of the aortic valve. Aortic valve regurgitation is not visualized. Severe aortic valve stenosis. Procedure Date: 04/02/2021. FINDINGS  Left Ventricle: Left ventricular ejection fraction, by estimation, is 50 to 55%. The left ventricle has low normal function. The left ventricle demonstrates global hypokinesis. Right Ventricle: The right ventricular size is normal. No increase in right ventricular wall thickness. Right ventricular systolic  function is normal. Mitral Valve: The mitral valve is normal in structure. Mild mitral annular calcification. Mild mitral valve regurgitation. Tricuspid Valve: The tricuspid valve is grossly normal. Tricuspid valve regurgitation is not demonstrated. Aortic Valve: The aortic valve is tricuspid. There is severe calcifcation of the aortic valve. There is severe thickening of the aortic valve. Aortic valve regurgitation is not visualized. Severe aortic stenosis is present. Aortic valve mean gradient measures 3.0 mmHg. Aortic valve peak gradient measures 6.4 mmHg. Aortic valve area, by VTI measures 3.00 cm. There is a 23 mm Edwards Sapien prosthetic, stented (TAVR) valve present in the aortic position. Aorta: The aortic root is normal in size and structure. LEFT VENTRICLE PLAX 2D LVOT diam:     2.00 cm LV SV:         66 LV SV Index:   34 LVOT Area:     3.14 cm  AORTIC VALVE AV Area (Vmax):    2.21 cm AV Area (Vmean):   2.52 cm AV Area (VTI):     3.00 cm AV Vmax:           126.00 cm/s AV Vmean:          74.100 cm/s AV VTI:            0.221 m AV Peak Grad:      6.4 mmHg AV Mean Grad:      3.0 mmHg LVOT Vmax:         88.70 cm/s LVOT Vmean:  59.500 cm/s LVOT VTI:          0.211 m LVOT/AV VTI ratio: 0.95  SHUNTS Systemic VTI:  0.21 m Systemic Diam: 2.00 cm Sanda Klein MD Electronically signed by Sanda Klein MD Signature Date/Time: 04/02/2021/11:26:52 AM    Final    Structural Heart Procedure  Result Date: 04/02/2021 See surgical note for result.   Disposition   Pt is being discharged home today in good condition.  Follow-up Plans & Appointments    Follow-up Information     Eileen Stanford, PA-C. Go on 04/11/2021.   Specialties: Cardiology, Radiology Why: at 3:30. Please arrive 10 minutes prior to your appointment Contact information: [address] [place] [address] [phone]                Discharge Medications   Allergies as of 04/03/2021        Reactions   Dust Mite Extract Other (See Comments)   Sneezing   Pollen Extract Other (See Comments)   Sneezing        Medication List     TAKE these medications    acetaminophen 500 MG tablet Commonly known as: TYLENOL Take 1,000 mg by mouth every 6 (six) hours as needed for headache.   aspirin 81 MG chewable tablet Chew 1 tablet (81 mg total) by mouth daily. Start taking on: April 04, 2021   carvedilol 3.125 MG tablet Commonly known as: COREG Take 1 tablet (3.125 mg total) by mouth 2 (two) times daily.   cholecalciferol 25 MCG (1000 UNIT) tablet Commonly known as: VITAMIN D Take 1,000 Units by mouth in the morning and at bedtime.   Eliquis 5 MG Tabs tablet Generic drug: apixaban TAKE 1 TABLET(5 MG) BY MOUTH TWICE DAILY   Ester-C 500-550 MG Tabs Take 1 tablet by mouth in the morning.   Fish Oil 1000 MG Caps Take 1,000 mg by mouth in the morning and at bedtime.   loratadine 10 MG tablet Commonly known as: CLARITIN Take 10 mg by mouth daily as needed for allergies.   losartan 25 MG tablet Commonly known as: COZAAR TAKE 1 TABLET(25 MG) BY MOUTH DAILY   PRESERVISION AREDS 2 PO Take 1 tablet by mouth in the morning and at bedtime.   tadalafil 5 MG tablet Commonly known as: CIALIS Take 5 mg by mouth daily as needed for erectile dysfunction.   vitamin C 500 MG tablet Commonly known as: ASCORBIC ACID Take 500 mg by mouth every evening.        Outstanding Labs/Studies   None   Duration of Discharge Encounter   Greater than 30 minutes including physician time.  Signed, Kathyrn Drown, NP 04/03/2021, 10:21 AM [phone]   I have personally seen and examined this patient. I agree with the assessment and plan as outlined above.  Doing well one day post TAVR. Groins stable. Labs reviewed. HR elevated off of beta blocker. Resume Coreg today. New LBBB. Zio for 14 days. Discharge home.   Lauree Chandler 04/03/2021 12:40 PM

## 2021-04-02 NOTE — Plan of Care (Signed)
  Problem: Health Behavior/Discharge Planning: Goal: Ability to manage health-related needs will improve Outcome: Progressing   Problem: Clinical Measurements: Goal: Will remain free from infection Outcome: Progressing Goal: Diagnostic test results will improve Outcome: Progressing   

## 2021-04-02 NOTE — Anesthesia Procedure Notes (Signed)
Date/Time: 04/02/2021 9:16 AM Performed by: Carolan Clines, CRNA Pre-anesthesia Checklist: Patient identified, Emergency Drugs available, Suction available and Patient being monitored Patient Re-evaluated:Patient Re-evaluated prior to induction Oxygen Delivery Method: Simple face mask Dental Injury: Teeth and Oropharynx as per pre-operative assessment

## 2021-04-02 NOTE — Discharge Instructions (Signed)
ACTIVITY AND EXERCISE °• Daily activity and exercise are an important part of your recovery. People recover at different rates depending on their general health and type of valve procedure. °• Most people recovering from TAVR feel better relatively quickly  °• No lifting, pushing, pulling more than 10 pounds (examples to avoid: groceries, vacuuming, gardening, golfing): °            - For one week with a procedure through the groin. °            - For six weeks for procedures through the chest wall or neck. °NOTE: You will typically see one of our providers 7-14 days after your procedure to discuss WHEN TO RESUME the above activities.  °  °  °DRIVING °• Do not drive until you are seen for follow up and cleared by a provider. Generally, we ask patient to not drive for 1 week after their procedure. °• If you have been told by your doctor in the past that you may not drive, you must talk with him/her before you begin driving again. °  °DRESSING °• Groin site: you may leave the clear dressing over the site for up to one week or until it falls off. °  °HYGIENE °• If you had a femoral (leg) procedure, you may take a shower when you return home. After the shower, pat the site dry. Do NOT use powder, oils or lotions in your groin area until the site has completely healed. °• If you had a chest procedure, you may shower when you return home unless specifically instructed not to by your discharging practitioner. °            - DO NOT scrub incision; pat dry with a towel. °            - DO NOT apply any lotions, oils, powders to the incision. °            - No tub baths / swimming for at least 2 weeks. °• If you notice any fevers, chills, increased pain, swelling, bleeding or pus, please contact your doctor. °  °ADDITIONAL INFORMATION °• If you are going to have an upcoming dental procedure, please contact our office as you will require antibiotics ahead of time to prevent infection on your heart valve.  ° ° °If you have any  questions or concerns you can call the structural heart phone during normal business hours 8am-4pm. If you have an urgent need after hours or weekends please call [phone] to talk to the on call provider for general cardiology. If you have an emergency that requires immediate attention, please call 911.  ° ° °After TAVR Checklist ° °Check  Test Description  ° Follow up appointment in 1-2 weeks  You will see our structural heart physician assistant, Katie Thompson. Your incision sites will be checked and you will be cleared to drive and resume all normal activities if you are doing well.    ° 1 month echo and follow up  You will have an echo to check on your new heart valve and be seen back in the office by Katie Thompson. Many times the echo is not read by your appointment time, but Katie will call you later that day or the following day to report your results.  ° Follow up with your primary cardiologist You will need to be seen by your primary cardiologist in the following 3-6 months after your 1 month appointment in the valve   clinic. Often times your Plavix or Aspirin will be discontinued during this time, but this is decided on a case by case basis.   ° 1 year echo and follow up You will have another echo to check on your heart valve after 1 year and be seen back in the office by Katie Thompson. This your last structural heart visit.  ° Bacterial endocarditis prophylaxis  You will have to take antibiotics for the rest of your life before all dental procedures (even teeth cleanings) to protect your heart valve. Antibiotics are also required before some surgeries. Please check with your cardiologist before scheduling any surgeries. Also, please make sure to tell us if you have a penicillin allergy as you will require an alternative antibiotic.   ° ° °

## 2021-04-02 NOTE — Progress Notes (Signed)
Pt arrived to 4e from cath lab. Pt oriented to room and staff. Vitals obtained. CHG bath done. Bilateral groin sites level 0. Pt educated on bedrest until 2:30pm.

## 2021-04-02 NOTE — Progress Notes (Signed)
  Echocardiogram 2D Echocardiogram has been performed.  Nicholas Sutton 04/02/2021, 10:36 AM

## 2021-04-02 NOTE — Interval H&P Note (Signed)
History and Physical Interval Note:  04/02/2021 8:17 AM  Nicholas Sutton  has presented today for surgery, with the diagnosis of severe aortic stenosis.  The various methods of treatment have been discussed with the patient and family. After consideration of risks, benefits and other options for treatment, the patient has consented to  Procedure(s): TRANSCATHETER AORTIC VALVE REPLACEMENT, TRANSFEMORAL (N/A) TRANSESOPHAGEAL ECHOCARDIOGRAM (TEE) (N/A) as a surgical intervention.  The patient's history has been reviewed, patient examined, no change in status, stable for surgery.  I have reviewed the patient's chart and labs.  Questions were answered to the patient's satisfaction.     Gaye Pollack

## 2021-04-03 ENCOUNTER — Inpatient Hospital Stay (HOSPITAL_COMMUNITY): Payer: Medicare Other

## 2021-04-03 ENCOUNTER — Telehealth: Payer: Self-pay | Admitting: Cardiovascular Disease

## 2021-04-03 ENCOUNTER — Other Ambulatory Visit: Payer: Self-pay | Admitting: Cardiology

## 2021-04-03 DIAGNOSIS — I4819 Other persistent atrial fibrillation: Secondary | ICD-10-CM

## 2021-04-03 DIAGNOSIS — I447 Left bundle-branch block, unspecified: Secondary | ICD-10-CM

## 2021-04-03 DIAGNOSIS — Z952 Presence of prosthetic heart valve: Secondary | ICD-10-CM

## 2021-04-03 DIAGNOSIS — I350 Nonrheumatic aortic (valve) stenosis: Principal | ICD-10-CM

## 2021-04-03 LAB — BASIC METABOLIC PANEL
Anion gap: 11 (ref 5–15)
BUN: 12 mg/dL (ref 8–23)
CO2: 25 mmol/L (ref 22–32)
Calcium: 8.8 mg/dL — ABNORMAL LOW (ref 8.9–10.3)
Chloride: 100 mmol/L (ref 98–111)
Creatinine, Ser: 0.83 mg/dL (ref 0.61–1.24)
GFR, Estimated: >60 mL/min (ref >60)
Glucose, Bld: 102 mg/dL — ABNORMAL HIGH (ref 70–99)
Potassium: 4.1 mmol/L (ref 3.5–5.1)
Sodium: 136 mmol/L (ref 135–145)

## 2021-04-03 LAB — CBC
HCT: 38.7 % — ABNORMAL LOW (ref 39.0–52.0)
Hemoglobin: 13.1 g/dL (ref 13.0–17.0)
MCH: 30.4 pg (ref 26.0–34.0)
MCHC: 33.9 g/dL (ref 30.0–36.0)
MCV: 89.8 fL (ref 80.0–100.0)
Platelets: 101 10*3/uL — ABNORMAL LOW (ref 150–400)
RBC: 4.31 MIL/uL (ref 4.22–5.81)
RDW: 13.5 % (ref 11.5–15.5)
WBC: 10 10*3/uL (ref 4.0–10.5)
nRBC: 0 % (ref 0.0–0.2)

## 2021-04-03 LAB — ECHOCARDIOGRAM COMPLETE
AR max vel: 1.03 cm2
AV Area VTI: 0.96 cm2
AV Area mean vel: 1.02 cm2
AV Mean grad: 16 mmHg
AV Peak grad: 27 mmHg
Ao pk vel: 2.6 m/s
Area-P 1/2: 3.61 cm2
Height: 72 in
S' Lateral: 3.3 cm
Weight: 2592.61 oz

## 2021-04-03 MED ORDER — ASPIRIN 81 MG PO CHEW: 81.0000 mg | CHEWABLE_TABLET | Freq: Every day | ORAL | Status: DC

## 2021-04-03 MED ORDER — CARVEDILOL 3.125 MG PO TABS
3.1250 mg | ORAL_TABLET | Freq: Two times a day (BID) | ORAL | Status: DC
  Administered 2021-04-03: 3.125 mg via ORAL
  Filled 2021-04-03: qty 1

## 2021-04-03 NOTE — Telephone Encounter (Signed)
Nicholas Sutton from [facility] calling with abnormal ekg results.

## 2021-04-03 NOTE — Progress Notes (Signed)
Notified on call cardiology about patient's HR elevating 120-140's. HR does not sustain. Patient denies CP/SOB.   Awaiting call back?? Will continue to monitor

## 2021-04-03 NOTE — Progress Notes (Signed)
Mobility Specialist Progress Note    04/03/21 1005  Mobility  Activity Ambulated in hall  Level of Assistance Independent  Assistive Device None  Distance Ambulated (ft) 510 ft  Mobility Ambulated independently in hallway  Mobility Response Tolerated well  Mobility performed by Mobility specialist  $Mobility charge 1 Mobility   Pre-Mobility: 86 HR, 124/81 BP Post-Mobility: 96 HR, 106/90 BP  Pt agreeable to ambulate. Pt c/o feeling a dull soreness at incision site. Asx throughout walk but left knee buckled when stopped to put mask on. Upon return had successful void in BR and went to bed with call bell in reach.   Nicholas Sutton Mobility Specialist  Mobility Specialist Phone: [phone]

## 2021-04-03 NOTE — Progress Notes (Signed)
Pt discharging home with wife. AVS reviewed with pt and wife. All questions were answered. IVs and telemetry box removed. Pt has all belongings packed. Pt waiting for volunteer to take pt out to main entrance for discharge.

## 2021-04-03 NOTE — Progress Notes (Signed)
Echocardiogram 2D Echocardiogram has been performed.  Oneal Deputy Telisa Ohlsen RDCS 04/03/2021, 12:29 PM

## 2021-04-03 NOTE — Telephone Encounter (Signed)
Fax received. MD McAlhany reviewed and signed, plan to continue to monitor.

## 2021-04-03 NOTE — Progress Notes (Signed)
Paged returned. No new orders at this time

## 2021-04-03 NOTE — Progress Notes (Signed)
Pt just ambulated with MT. Reported HR now stable, maintained 90s during walk. No RW needed. Discussed exercising on his own, restrictions, and CRPII. Pt not interested in CRPII, like his TM.  [phone] Yves Dill CES, ACSM 10:20 AM 04/03/2021

## 2021-04-03 NOTE — Telephone Encounter (Signed)
New onset  04/03/21 at 11:30 am Afib Heart rate range from 77-147 BPM. Lasting for 90 seconds. Awaiting fax.   From hospital note today:  4. Tachycardia/Atrial fibrillation: Tachycardic with HR's in the 100-120's however carvedilol held post TAVR. Will restart today at PTA dose. He is on Eliquis for anticoagulation which we will resume.   Spoke to the patient and he is feeling pretty good today. Note that he is discharging now from TAVR surgery and the monitor was placed prior to discharge. No symptoms related to Afib.

## 2021-04-04 ENCOUNTER — Telehealth: Payer: Self-pay | Admitting: Physician Assistant

## 2021-04-04 NOTE — Telephone Encounter (Signed)
  [facility] VALVE TEAM   Patient contacted regarding discharge from [facility] on 9/14  Patient understands to follow up with provider Nell Range on 9/22 at [facility].  Patient understands discharge instructions? yes Patient understands medications and regimen? yes Patient understands to bring all medications to this visit? yes  Angelena Form PA-C  [facility]

## 2021-04-11 ENCOUNTER — Ambulatory Visit (INDEPENDENT_AMBULATORY_CARE_PROVIDER_SITE_OTHER): Payer: Medicare Other | Admitting: Physician Assistant

## 2021-04-11 ENCOUNTER — Encounter: Payer: Self-pay | Admitting: Physician Assistant

## 2021-04-11 ENCOUNTER — Other Ambulatory Visit: Payer: Self-pay

## 2021-04-11 VITALS — BP 110/56 | HR 107 | Ht 72.0 in | Wt 168.0 lb

## 2021-04-11 DIAGNOSIS — I714 Abdominal aortic aneurysm, without rupture, unspecified: Secondary | ICD-10-CM

## 2021-04-11 DIAGNOSIS — Z952 Presence of prosthetic heart valve: Secondary | ICD-10-CM | POA: Diagnosis not present

## 2021-04-11 DIAGNOSIS — I4819 Other persistent atrial fibrillation: Secondary | ICD-10-CM | POA: Diagnosis not present

## 2021-04-11 DIAGNOSIS — I428 Other cardiomyopathies: Secondary | ICD-10-CM | POA: Diagnosis not present

## 2021-04-11 DIAGNOSIS — I447 Left bundle-branch block, unspecified: Secondary | ICD-10-CM

## 2021-04-11 MED ORDER — AMOXICILLIN 500 MG PO CAPS: 500.0000 mg | ORAL_CAPSULE | Freq: Once | ORAL | 3 refills | Status: AC

## 2021-04-11 NOTE — Progress Notes (Signed)
HEART AND [place]                                     Cardiology Office Note:    Date:  04/11/2021   ID:  Nicholas Sutton, DOB May 17, 1946, MRN [id number]  PCP:  Redmond School, MD  [facility] Cardiologist:  Lauree Chandler, MD  [facility] Electrophysiologist:  None   Referring MD: Redmond School, MD   [facility] s/p TAVR  History of Present Illness:    Nicholas Sutton is a 75 y.o. male with a hx of non-ischemic cardiomyopathy with LV normalization, former tobacco abuse, persistent atrial fibrillation on Eliquis, severe aortic stenosis s/p TAVR (04/02/21) who presented to [facility] on 04/02/21 who presents to clinic for follow up.   [name] has been followed by Dr. Saunders Revel. Echo 07/2016 showed an LVEF at 35-40% with subsequent LHC showing mild CAD and normal LVEF. He has persistent atrial fibrillation anticoagulated with Eliquis and Coreg. Repeat echocardiogram from 02/2020 showed an LVEF at 55-60%, moderately severe AS with mean gradient 28.5 mmHg, peak gradient 50.6 mmHg, AVA 0.77 cm2 and dimensionless index 0.24 and mild to moderate AI. HeartCare was contacted for pre-operative clearance prior to hernia repair. At that time he reported a change in symptoms with increased fatigue. His follow up appointment was moved forward and he was seen by Dr. Angelena Form. Repeat echo was performed which showed an LVEF at 60-65% however his aortic stenosis had worsened with a mean gradient 35.3 mmHg, peak gradient 56 mmHg, AVA 0.65 cm2, dimensionless index 0.25.    He was evaluated by the multidisciplinary valve team and underwent successful TAVR with a 23 mm Edwards Sapien 3 Ultra THV via the TF approach on 04/02/21. Post op echo showed EF 50%, normally functioning TAVR with a mean gradient of 16 mmHg and trivial PVL. He was discharged on home Eliquis with the addition of baby aspirin to be continued x 6 months. Given new LBBB, a Zio At was ordered.   Today the  patient presents to clinic for follow up. Feels a little tired. [name]. No CP or SOB. No LE edema, orthopnea or PND. No dizziness or syncope. No blood in stool or urine. No palpitations. Does feel like he is a little more tired on treadmill. Can't tell a big difference in symptoms since surgery but didn't have a lot of symptoms to start with.     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 Angelena Form & Dr. Cyndia Bent   Severe aortic stenosis     Past Surgical History:  Procedure Laterality Date   APPENDECTOMY     COLON RESECTION     COLOSTOMY REVISION N/A 07/06/2013   Procedure: COLON RESECTION SIGMOID;  Surgeon: Edward Jolly, MD;  Location: [facility];  Service: General;  Laterality: N/A;   FISTULOTOMY N/A 07/06/2013   Procedure: repair of colovesicle fistula/ FISTULOTOMY;  Surgeon: Edward Jolly, MD;  Location: [facility];  Service:  General;  Laterality: N/A;   GUM SURGERY     HERNIA REPAIR     INGUINAL HERNIA REPAIR Left 10/10/2015   Procedure: LEFT INGUINAL HERNIA REPAIR ;  Surgeon: Donnie Mesa, MD;  Location: [place];  Service: General;  Laterality: Left;   INSERTION OF MESH Left 10/10/2015   Procedure: INSERTION OF MESH;  Surgeon: Donnie Mesa, MD;  Location: [place];  Service: General;  Laterality: Left;   RIGHT/LEFT HEART CATH AND CORONARY ANGIOGRAPHY N/A 10/09/2016   Procedure: Right/Left Heart Cath and Coronary Angiography;  Surgeon: Nelva Bush, MD;  Location: [place] CV LAB;  Service: Cardiovascular;  Laterality: N/A;   RIGHT/LEFT HEART CATH AND CORONARY ANGIOGRAPHY N/A 02/13/2021   Procedure:  RIGHT/LEFT HEART CATH AND CORONARY ANGIOGRAPHY;  Surgeon: Burnell Blanks, MD;  Location: [place] CV LAB;  Service: Cardiovascular;  Laterality: N/A;   TEE WITHOUT CARDIOVERSION N/A 04/02/2021   Procedure: TRANSESOPHAGEAL ECHOCARDIOGRAM (TEE);  Surgeon: Burnell Blanks, MD;  Location: [place] CV LAB;  Service: Open Heart Surgery;  Laterality: N/A;   TRANSCATHETER AORTIC VALVE REPLACEMENT, TRANSFEMORAL N/A 04/02/2021   Procedure: TRANSCATHETER AORTIC VALVE REPLACEMENT, TRANSFEMORAL;  Surgeon: Burnell Blanks, MD;  Location: [place] CV LAB;  Service: Open Heart Surgery;  Laterality: N/A;    Current Medications: Current Meds  Medication Sig   acetaminophen (TYLENOL) 500 MG tablet Take 1,000 mg by mouth every 6 (six) hours as needed for headache.   amoxicillin (AMOXIL) 500 MG capsule Take 1 capsule (500 mg total) by mouth once for 1 dose. Take (4) capsules 1 hour before dental work   aspirin 81 MG chewable tablet Chew 1 tablet (81 mg total) by mouth daily.   Bioflavonoid Products (ESTER-C) 500-550 MG TABS Take 1 tablet by mouth in the morning.   carvedilol (COREG) 3.125 MG tablet Take 1 tablet (3.125 mg total) by mouth 2 (two) times daily.   cholecalciferol (VITAMIN D) 25 MCG (1000 UNIT) tablet Take 1,000 Units by mouth in the morning and at bedtime.   ELIQUIS 5 MG TABS tablet TAKE 1 TABLET(5 MG) BY MOUTH TWICE DAILY   loratadine (CLARITIN) 10 MG tablet Take 10 mg by mouth daily as needed for allergies.   losartan (COZAAR) 25 MG tablet TAKE 1 TABLET(25 MG) BY MOUTH DAILY   Multiple Vitamins-Minerals (PRESERVISION AREDS 2 PO) Take 1 tablet by mouth in the morning and at bedtime.   Omega-3 Fatty Acids (FISH OIL) 1000 MG CAPS Take 1,000 mg by mouth in the morning and at bedtime.   tadalafil (CIALIS) 5 MG tablet Take 5 mg by mouth daily as needed for erectile dysfunction.   vitamin C (ASCORBIC ACID) 500 MG tablet Take 500 mg by mouth every evening.     Allergies:    Dust mite extract and Pollen extract   Social History   Socioeconomic History   Marital status: Divorced    Spouse name: Not on file   Number of children: Not on file   Years of education: Not on file   Highest education level: Not on file  Occupational History   Not on file  Tobacco Use   Smoking status: Former    Packs/day: 1.50    Years: 30.00    Pack years: 45.00    Types: Cigarettes    Quit date: 06/03/2008    Years since quitting: 12.8   Smokeless tobacco: Never  Vaping Use   Vaping Use: Never used  Substance and Sexual Activity   Alcohol use: Not  Currently    Comment: rare   Drug use: No   Sexual activity: Never    Birth control/protection: None  Other Topics Concern   Not on file  Social History Narrative   Not on file   Social Determinants of Health   Financial Resource Strain: Not on file  Food Insecurity: Not on file  Transportation Needs: Not on file  Physical Activity: Not on file  Stress: Not on file  Social Connections: Not on file     Family History: The patient's family history includes Heart attack (age of onset: 80) in his father; Hypertension in his unknown relative; Stroke (age of onset: 37) in his mother.  ROS:   Please see the history of present illness.    All other systems reviewed and are negative.  EKGs/Labs/Other Studies Reviewed:    The following studies were reviewed today:   04/02/21: TAVR OPERATIVE NOTE     Date of Procedure:                04/02/2021   Preoperative Diagnosis:      Severe Aortic Stenosis    Postoperative Diagnosis:    Same    Procedure:        Transcatheter Aortic Valve Replacement - Percutaneous Right Transfemoral Approach             Edwards Sapien 3 Ultra THV (size 23 mm, model # 9750TFX, serial # [id number])              Co-Surgeons:                        Gaye Pollack, MD and Lauree Chandler, MD     Anesthesiologist:                  Suella Broad, MD   Echocardiographer:              Bertrum Sol, MD   Pre-operative Echo Findings: Severe aortic stenosis Normal left ventricular systolic function   Post-operative Echo Findings: No paravalvular leak Normal left ventricular systolic function   --------------------------------   Echo 04/03/21: IMPRESSIONS   1. Left ventricular ejection fraction, by estimation, is 50 to 55%. The  left ventricle has low normal function. The left ventricle has no regional  wall motion abnormalities. Left ventricular diastolic function could not  be evaluated.   2. Right ventricular systolic function is normal. The right ventricular  size is mildly enlarged. There is normal pulmonary artery systolic  pressure.   3. Left atrial size was mildly dilated.   4. Right atrial size was moderately dilated.   5. The mitral valve is normal in structure. Trivial mitral valve  regurgitation.   6. The aortic valve has been repaired/replaced. Aortic valve  regurgitation is trivial. There is a Edwards Ultra, stented (TAVR) valve  present in the aortic position. Procedure Date: 04/02/2021. Echo findings  are consistent with a tiny perivalvular leak  of the aortic prosthesis. Aortic valve area, by VTI measures 0.96 cm.  Aortic valve mean gradient measures 16.0 mmHg. Aortic valve Vmax measures  2.60 m/s. Aortic valve acceleration time measures 71 msec. The  dimensionless index and the valve area likely  overestimate the degree of aortic valve obstruction due to inaccurate LVOT  pulsed Doppler sampling (poor beam alignment).   EKG:  EKG is ordered today.  The ekg ordered today demonstrates afib with LBBB, HR 107  Recent Labs: 03/29/2021: ALT 24 04/03/2021: BUN  12; Creatinine, Ser 0.83; Hemoglobin 13.1; Platelets 101; Potassium 4.1; Sodium 136  Recent Lipid Panel    Component Value Date/Time   CHOL 160 01/09/2020 0816   TRIG 55 01/09/2020 0816   HDL 42 01/09/2020 0816   CHOLHDL 3.8 01/09/2020 0816   LDLCALC 107 (H) 01/09/2020 0816     Risk  Assessment/Calculations:    CHA2DS2-VASc Score = 3   This indicates a 3.2% annual risk of stroke. The patient's score is based upon: CHF History: 1 HTN History: 0 Diabetes History: 0 Stroke History: 0 Vascular Disease History: 1 Age Score: 1 Gender Score: 0      Physical Exam:    VS:  BP (!) 110/56   Pulse (!) 107   Ht 6' (1.829 m)   Wt 168 lb (76.2 kg)   SpO2 95%   BMI 22.78 kg/m     Wt Readings from Last 3 Encounters:  04/11/21 168 lb (76.2 kg)  04/03/21 162 lb 0.6 oz (73.5 kg)  03/29/21 164 lb 6.4 oz (74.6 kg)     GEN:  Well nourished, well developed in no acute distress HEENT: Normal NECK: No JVD LYMPHATICS: No lymphadenopathy CARDIAC: RRR, no murmurs, rubs, gallops RESPIRATORY:  Clear to auscultation without rales, wheezing or rhonchi  ABDOMEN: Soft, non-tender, non-distended MUSCULOSKELETAL:  No edema; No deformity  SKIN: Warm and dry NEUROLOGIC:  Alert and oriented x 3 PSYCHIATRIC:  Normal affect   ASSESSMENT:    1. S/P TAVR (transcatheter aortic valve replacement)   2. LBBB (left bundle branch block)   3. Persistent atrial fibrillation ([place])   4. NICM (nonischemic cardiomyopathy) ([place])   5. AAA (abdominal aortic aneurysm) without rupture (HCC)    PLAN:    In order of problems listed above:  Severe AS s/p TAVR: doing great 1 week out from TAVR. Groin sites are stable. ECG with no HAVB. SBE prophylaxis discussed; I have RX'd amoxicillin. Continue on Eliquis and aspirin. He wonders how long he needs to be on aspirin. I told him he could stop aspirin after bottle runs out ( in about 30 days). I will see him back in 1 month for follow up and echo.   New LBBB: wearing Zio with no high risk alerts.   Permanent atrial fibrillation: HR a little elevated today but ECG taken right after walking. HR runs in a good range at home. Continue Eliquis  NICM: POD 1 echo showed EF 50 %   AAA: this was incidentally noted on pre TAVR CT. Infrarenal abdominal aorta  which measures 3.7 x 3.0 cm (mean diameter of 3.35 cm). Recommend follow-up US every 3 years. Plan to follow with primary cardiologist.   Medication Adjustments/Labs and Tests Ordered: Current medicines are reviewed at length with the patient today.  Concerns regarding medicines are outlined above.  Orders Placed This Encounter  Procedures   EKG 12-Lead   Meds ordered this encounter  Medications   amoxicillin (AMOXIL) 500 MG capsule    Sig: Take 1 capsule (500 mg total) by mouth once for 1 dose. Take (4) capsules 1 hour before dental work    Dispense:  4 capsule    Refill:  3    Patient Instructions  Medication Instructions:  The current medical regimen is effective;  continue present plan and medications.  Please take Amoxicillin 500 mg (4) capsules one hour before any dental work.  *If you need a refill on your cardiac medications before your next appointment, please call your pharmacy*  Follow-Up: At  [facility], you and your health needs are our priority.  As part of our continuing mission to provide you with exceptional heart care, we have created designated Provider Care Teams.  These Care Teams include your primary Cardiologist (physician) and Advanced Practice Providers (APPs -  Physician Assistants and Nurse Practitioners) who all work together to provide you with the care you need, when you need it.  We recommend signing up for the patient portal called "MyChart".  Sign up information is provided on this After Visit Summary.  MyChart is used to connect with patients for Virtual Visits (Telemedicine).  Patients are able to view lab/test results, encounter notes, upcoming appointments, etc.  Non-urgent messages can be sent to your provider as well.   To learn more about what you can do with MyChart, go to [email or web address].    Your next appointment:   As scheduled.  Thank you for choosing [facility]!!     Signed, Angelena Form, PA-C  04/11/2021  5:10 PM    [facility]

## 2021-04-11 NOTE — Patient Instructions (Signed)
Medication Instructions:  The current medical regimen is effective;  continue present plan and medications.  Please take Amoxicillin 500 mg (4) capsules one hour before any dental work.  *If you need a refill on your cardiac medications before your next appointment, please call your pharmacy*  Follow-Up: At [facility], you and your health needs are our priority.  As part of our continuing mission to provide you with exceptional heart care, we have created designated Provider Care Teams.  These Care Teams include your primary Cardiologist (physician) and Advanced Practice Providers (APPs -  Physician Assistants and Nurse Practitioners) who all work together to provide you with the care you need, when you need it.  We recommend signing up for the patient portal called "MyChart".  Sign up information is provided on this After Visit Summary.  MyChart is used to connect with patients for Virtual Visits (Telemedicine).  Patients are able to view lab/test results, encounter notes, upcoming appointments, etc.  Non-urgent messages can be sent to your provider as well.   To learn more about what you can do with MyChart, go to [email or web address].    Your next appointment:   As scheduled.  Thank you for choosing [facility]!!

## 2021-04-24 ENCOUNTER — Telehealth: Payer: Self-pay | Admitting: Cardiovascular Disease

## 2021-04-24 NOTE — Telephone Encounter (Signed)
Left message for patient that we heard from [employer] that he does not want to continue to wear the Zio monitor.  Adv that if he is going to stop wearing that he should return it so that we have the all the data collected back in time for his appt on 05/01/21.  There is one call for an alert for new afib.  Monitor was ordered on 04/03/21.

## 2021-04-24 NOTE — Telephone Encounter (Signed)
   Nicholas Sutton with [employer], he said pt advised them that pt doesn't want to wear the 2nd heart monitor to complete the 28 days.

## 2021-05-01 ENCOUNTER — Ambulatory Visit (HOSPITAL_COMMUNITY): Payer: Medicare Other | Attending: Cardiology

## 2021-05-01 ENCOUNTER — Other Ambulatory Visit: Payer: Self-pay

## 2021-05-01 ENCOUNTER — Encounter: Payer: Self-pay | Admitting: Physician Assistant

## 2021-05-01 ENCOUNTER — Ambulatory Visit (INDEPENDENT_AMBULATORY_CARE_PROVIDER_SITE_OTHER): Payer: Medicare Other | Admitting: Physician Assistant

## 2021-05-01 VITALS — BP 98/72 | HR 92 | Ht 72.0 in | Wt 165.0 lb

## 2021-05-01 DIAGNOSIS — I4819 Other persistent atrial fibrillation: Secondary | ICD-10-CM

## 2021-05-01 DIAGNOSIS — Z952 Presence of prosthetic heart valve: Secondary | ICD-10-CM

## 2021-05-01 DIAGNOSIS — R5383 Other fatigue: Secondary | ICD-10-CM

## 2021-05-01 DIAGNOSIS — I447 Left bundle-branch block, unspecified: Secondary | ICD-10-CM | POA: Diagnosis not present

## 2021-05-01 DIAGNOSIS — I2510 Atherosclerotic heart disease of native coronary artery without angina pectoris: Secondary | ICD-10-CM | POA: Diagnosis not present

## 2021-05-01 DIAGNOSIS — I4729 Other ventricular tachycardia: Secondary | ICD-10-CM

## 2021-05-01 DIAGNOSIS — I428 Other cardiomyopathies: Secondary | ICD-10-CM

## 2021-05-01 DIAGNOSIS — I714 Abdominal aortic aneurysm, without rupture, unspecified: Secondary | ICD-10-CM

## 2021-05-01 LAB — ECHOCARDIOGRAM COMPLETE
AV Mean grad: 12 mmHg
AV Peak grad: 21 mmHg
Ao pk vel: 2.29 m/s
S' Lateral: 2.8 cm

## 2021-05-01 NOTE — Progress Notes (Signed)
HEART AND [place]                                     Cardiology Office Note:    Date:  05/01/2021   ID:  Nicholas Sutton, DOB 23-Mar-1946, MRN [id number]  PCP:  Redmond School, MD  [facility] Cardiologist:  Lauree Chandler, MD  [facility] Electrophysiologist:  None   Referring MD: Redmond School, MD   1 month s/p TAVR  History of Present Illness:    Nicholas Sutton is a 75 y.o. male with a hx of non-ischemic cardiomyopathy with LV normalization, former tobacco abuse, persistent atrial fibrillation on Eliquis, severe aortic stenosis s/p TAVR (04/02/21) who presented to [facility] on 04/02/21 who presents to clinic for follow up.   Mr. Housey has been followed by Dr. Saunders Revel. Echo 07/2016 showed an LVEF at 35-40% with subsequent LHC showing mild CAD and normal LVEF. He has persistent atrial fibrillation anticoagulated with Eliquis and Coreg. Repeat echocardiogram from 02/2020 showed an LVEF at 55-60%, moderately severe AS with mean gradient 28.5 mmHg, peak gradient 50.6 mmHg, AVA 0.77 cm2 and dimensionless index 0.24 and mild to moderate AI. HeartCare was contacted for pre-operative clearance prior to hernia repair. At that time he reported a change in symptoms with increased fatigue. His follow up appointment was moved forward and he was seen by Dr. Angelena Form. Repeat echo was performed which showed an LVEF at 60-65% however his aortic stenosis had worsened with a mean gradient 35.3 mmHg, peak gradient 56 mmHg, AVA 0.65 cm2, dimensionless index 0.25.    He was evaluated by the multidisciplinary valve team and underwent successful TAVR with a 23 mm Edwards Sapien 3 Ultra THV via the TF approach on 04/02/21. Post op echo showed EF 50%, normally functioning TAVR with a mean gradient of 16 mmHg and trivial PVL. He was discharged on home Eliquis with the addition of baby aspirin to be continued x 6 months. Given new LBBB, a Zio At was ordered. Zio  showed 1 run of Ventricular Tachycardia occurred lasting 9 beats with a max rate of 164 bpm (avg 139 bpm) as well as persistent atrial fibrillation. Heart rate ranging from 40-174 bpm (avg of 90 bpm).   Today the patient presents to clinic for follow up. Can do 30 minutes on a treadmill without issues but feels short of breath when walking out to the shed and doing work. Takes frequent breaks. Also has fatigue and has to take naps. Disappointed fatigue and shortness of breath aren't better after TAVR. No CP. No LE edema, orthopnea or PND. Occasional dizziness but no syncope. No blood in stool or urine. No palpitations.    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 Angelena Form & Dr. Cyndia Bent   Severe aortic stenosis     Past Surgical History:  Procedure Laterality Date   APPENDECTOMY     COLON RESECTION     COLOSTOMY REVISION N/A 07/06/2013   Procedure: COLON RESECTION SIGMOID;  Surgeon: Edward Jolly, MD;  Location: [facility];  Service: General;  Laterality: N/A;   FISTULOTOMY N/A 07/06/2013   Procedure: repair of colovesicle fistula/ FISTULOTOMY;  Surgeon: Edward Jolly, MD;  Location: [facility];  Service: General;  Laterality: N/A;   GUM SURGERY     HERNIA REPAIR     INGUINAL HERNIA REPAIR Left 10/10/2015   Procedure: LEFT INGUINAL HERNIA REPAIR ;  Surgeon: Donnie Mesa, MD;  Location: [place];  Service: General;  Laterality: Left;   INSERTION OF MESH Left 10/10/2015   Procedure: INSERTION OF MESH;  Surgeon: Donnie Mesa, MD;  Location: [place];  Service: General;  Laterality: Left;   RIGHT/LEFT HEART CATH AND CORONARY ANGIOGRAPHY  N/A 10/09/2016   Procedure: Right/Left Heart Cath and Coronary Angiography;  Surgeon: Nelva Bush, MD;  Location: [place] [facility];  Service: Cardiovascular;  Laterality: N/A;   RIGHT/LEFT HEART CATH AND CORONARY ANGIOGRAPHY N/A 02/13/2021   Procedure: RIGHT/LEFT HEART CATH AND CORONARY ANGIOGRAPHY;  Surgeon: Burnell Blanks, MD;  Location: [place] CV LAB;  Service: Cardiovascular;  Laterality: N/A;   TEE WITHOUT CARDIOVERSION N/A 04/02/2021   Procedure: TRANSESOPHAGEAL ECHOCARDIOGRAM (TEE);  Surgeon: Burnell Blanks, MD;  Location: [place] CV LAB;  Service: Open Heart Surgery;  Laterality: N/A;   TRANSCATHETER AORTIC VALVE REPLACEMENT, TRANSFEMORAL N/A 04/02/2021   Procedure: TRANSCATHETER AORTIC VALVE REPLACEMENT, TRANSFEMORAL;  Surgeon: Burnell Blanks, MD;  Location: [place] CV LAB;  Service: Open Heart Surgery;  Laterality: N/A;    Current Medications: Current Meds  Medication Sig   acetaminophen (TYLENOL) 500 MG tablet Take 1,000 mg by mouth every 6 (six) hours as needed for headache.   amoxicillin (AMOXIL) 500 MG tablet Take 500 mg by mouth as directed. Take 4 tablets by mouth 1 hour prior to dental work   aspirin 81 MG chewable tablet Chew 1 tablet (81 mg total) by mouth daily.   Bioflavonoid Products (ESTER-C) 500-550 MG TABS Take 1 tablet by mouth in the morning.   carvedilol (COREG) 3.125 MG tablet Take 1 tablet (3.125 mg total) by mouth 2 (two) times daily.   cholecalciferol (VITAMIN D) 25 MCG (1000 UNIT) tablet Take 1,000 Units by mouth in the morning and at bedtime.   ELIQUIS 5 MG TABS tablet TAKE 1 TABLET(5 MG) BY MOUTH TWICE DAILY   loratadine (CLARITIN) 10 MG tablet Take 10 mg by mouth daily as needed for allergies.   losartan (COZAAR) 25 MG tablet TAKE 1 TABLET(25 MG) BY MOUTH DAILY   Multiple Vitamins-Minerals (PRESERVISION AREDS 2 PO) Take 1 tablet by mouth in the morning and at bedtime.   Omega-3 Fatty Acids (FISH OIL) 1000 MG CAPS  Take 1,000 mg by mouth in the morning and at bedtime.   tadalafil (CIALIS) 5 MG tablet Take 5 mg by mouth daily as needed for erectile dysfunction.   vitamin C (ASCORBIC ACID) 500 MG tablet Take 500 mg by mouth every evening.     Allergies:   Dust mite extract and Pollen extract   Social History   Socioeconomic History   Marital status: Divorced    Spouse name: Not on file   Number of children: Not on file   Years of education: Not on file   Highest education level: Not on file  Occupational History   Not on file  Tobacco Use   Smoking status: Former    Packs/day: 1.50    Years: 30.00    Pack years: 45.00    Types:  Cigarettes    Quit date: 06/03/2008    Years since quitting: 12.9   Smokeless tobacco: Never  Vaping Use   Vaping Use: Never used  Substance and Sexual Activity   Alcohol use: Not Currently    Comment: rare   Drug use: No   Sexual activity: Never    Birth control/protection: None  Other Topics Concern   Not on file  Social History Narrative   Not on file   Social Determinants of Health   Financial Resource Strain: Not on file  Food Insecurity: Not on file  Transportation Needs: Not on file  Physical Activity: Not on file  Stress: Not on file  Social Connections: Not on file     Family History: The patient's family history includes Heart attack (age of onset: 49) in his father; Hypertension in his unknown relative; Stroke (age of onset: 61) in his mother.  ROS:   Please see the history of present illness.    All other systems reviewed and are negative.  EKGs/Labs/Other Studies Reviewed:    The following studies were reviewed today:   04/02/21: TAVR OPERATIVE NOTE     Date of Procedure:                04/02/2021   Preoperative Diagnosis:      Severe Aortic Stenosis    Postoperative Diagnosis:    Same    Procedure:        Transcatheter Aortic Valve Replacement - Percutaneous Right Transfemoral Approach             Edwards Sapien 3 Ultra  THV (size 23 mm, model # 9750TFX, serial # [id number])              Co-Surgeons:                        Gaye Pollack, MD and Lauree Chandler, MD     Anesthesiologist:                  Suella Broad, MD   Echocardiographer:              Bertrum Sol, MD   Pre-operative Echo Findings: Severe aortic stenosis Normal left ventricular systolic function   Post-operative Echo Findings: No paravalvular leak Normal left ventricular systolic function   --------------------------------   Echo 04/03/21: IMPRESSIONS   1. Left ventricular ejection fraction, by estimation, is 50 to 55%. The  left ventricle has low normal function. The left ventricle has no regional  wall motion abnormalities. Left ventricular diastolic function could not  be evaluated.   2. Right ventricular systolic function is normal. The right ventricular  size is mildly enlarged. There is normal pulmonary artery systolic  pressure.   3. Left atrial size was mildly dilated.   4. Right atrial size was moderately dilated.   5. The mitral valve is normal in structure. Trivial mitral valve  regurgitation.   6. The aortic valve has been repaired/replaced. Aortic valve  regurgitation is trivial. There is a Edwards Ultra, stented (TAVR) valve  present in the aortic position. Procedure Date: 04/02/2021. Echo findings  are consistent with a tiny perivalvular leak  of the aortic prosthesis. Aortic valve area, by VTI measures 0.96 cm.  Aortic valve mean gradient measures 16.0 mmHg. Aortic valve Vmax measures  2.60 m/s. Aortic valve acceleration time measures 71 msec. The  dimensionless index and the valve area likely  overestimate the degree of aortic valve obstruction  due to inaccurate LVOT  pulsed Doppler sampling (poor beam alignment).   _______________________  ZIO AT   Patch Wear Time:  8 days and 5 hours (2022-09-14T11:24:13-0400 to 2022-09-22T17:11:21-0400)   1 run of Ventricular Tachycardia occurred lasting 9  beats with a max rate of 164 bpm (avg 139 bpm).  Persistent Atrial Fibrillation. Heart rate ranging from 40-174 bpm (avg of 90 bpm). Intermittent Bundle Branch Block was present. Isolated PVCs were rare (<1.0%)   No high grade AV block noted  _______________________  Echo 05/01/21 IMPRESSIONS  1. Left ventricular ejection fraction, by estimation, is 50 to 55%. The  left ventricle has low normal function. The left ventricle has no regional  wall motion abnormalities. Left ventricular diastolic function could not  be evaluated.   2. Right ventricular systolic function is normal. The right ventricular  size is normal.   3. Left atrial size was severely dilated.   4. Right atrial size was severely dilated.   5. The mitral valve is normal in structure. Trivial mitral valve  regurgitation. No evidence of mitral stenosis.   6. Tricuspid valve regurgitation is mild to moderate.   7. The aortic valve has been repaired/replaced. Aortic valve  regurgitation is not visualized. Mild aortic valve stenosis. Echo findings  are consistent with normal structure and function of the aortic valve  prosthesis. Aortic valve mean gradient measures   12.0 mmHg. Aortic valve Vmax measures 2.29 m/s.   8. The inferior vena cava is normal in size with greater than 50%  respiratory variability, suggesting right atrial pressure of 3 mmHg.   EKG:  EKG is NOT ordered today.    Recent Labs: 03/29/2021: ALT 24 04/03/2021: BUN 12; Creatinine, Ser 0.83; Hemoglobin 13.1; Platelets 101; Potassium 4.1; Sodium 136  Recent Lipid Panel    Component Value Date/Time   CHOL 160 01/09/2020 0816   TRIG 55 01/09/2020 0816   HDL 42 01/09/2020 0816   CHOLHDL 3.8 01/09/2020 0816   LDLCALC 107 (H) 01/09/2020 0816     Risk Assessment/Calculations:    CHA2DS2-VASc Score = 3   This indicates a 3.2% annual risk of stroke. The patient's score is based upon: CHF History: 1 HTN History: 0 Diabetes History: 0 Stroke History:  0 Vascular Disease History: 1 Age Score: 1 Gender Score: 0      Physical Exam:    VS:  BP 98/72   Pulse 92   Ht 6' (1.829 m)   Wt 165 lb (74.8 kg)   SpO2 98%   BMI 22.38 kg/m     Wt Readings from Last 3 Encounters:  05/01/21 165 lb (74.8 kg)  04/11/21 168 lb (76.2 kg)  04/03/21 162 lb 0.6 oz (73.5 kg)     GEN:  Well nourished, well developed in no acute distress HEENT: Normal NECK: No JVD LYMPHATICS: No lymphadenopathy CARDIAC: RRR, no murmurs, rubs, gallops RESPIRATORY:  Clear to auscultation without rales, wheezing or rhonchi  ABDOMEN: Soft, non-tender, non-distended MUSCULOSKELETAL:  No edema; No deformity  SKIN: Warm and dry NEUROLOGIC:  Alert and oriented x 3 PSYCHIATRIC:  Normal affect   ASSESSMENT:    1. S/P TAVR (transcatheter aortic valve replacement)   2. LBBB (left bundle branch block)   3. Persistent atrial fibrillation ([place])   4. NICM (nonischemic cardiomyopathy) ([place])   5. Abdominal aortic aneurysm (AAA) without rupture, unspecified part   6. NSVT (nonsustained ventricular tachycardia)   7. Other fatigue     PLAN:    In order of problems  listed above:  Severe AS s/p TAVR: echo today shows EF 50%, normally functioning TAVR with a mean gradient of 12 mmHg and no PVL. He has NYHA class II symptoms; mostly of dyspnea and fatigue. SBE prophylaxis discussed; he has amoxicillin. Continue on Eliquis alone. He did take ASA 81 mg x 1 month. I will see him back in 1 year with an echo.   New LBBB: Zio patch did not show any HAVB.    Permanent atrial fibrillation: Zio patch showed an average HR of 90 at home. Continue Coreg 3.125mg  BID and Eliquis. He wonders if dyspnea and fatigue related to this. Has been in afib since 2014 and has severely dilated LA so it would likely be difficult to restore sinus. He also hasn't had these symptoms dating back to that time, so I cannot clearly say they are related.   NICM: EF has historically been as low as 35-40%. Echo  today showed EF 50 %. Continue Losartan and Coreg.    AAA: this was incidentally noted on pre TAVR CT. Infrarenal abdominal aorta which measures 3.7 x 3.0 cm (mean diameter of 3.35 cm). Recommend follow-up US every 3 years. Plan to follow with primary cardiologist.  NSVT: 9 beat run of NSVT on recent Zio. On on Coreg 3.125mg  BID. Cannot titrate BB given soft BPs. Does not want to switch to Toprol xl.  Fatigue and dyspnea: does not regularly see PCP. Will check a TSH. He does not have any evidence of CHF, 02 sats 98%, Hg normal and as above, not clearly related to afib. He will continue working on exercise.   Medication Adjustments/Labs and Tests Ordered: Current medicines are reviewed at length with the patient today.  Concerns regarding medicines are outlined above.  Orders Placed This Encounter  Procedures   TSH    No orders of the defined types were placed in this encounter.   Patient Instructions  Medication Instructions:  Discontinue Aspirin after you finish your current bottle   *If you need a refill on your cardiac medications before your next appointment, please call your pharmacy*   Lab Work: Tsh- Today   If you have labs (blood work) drawn today and your tests are completely normal, you will receive your results only by: [place] (if you have MyChart) OR A paper copy in the mail If you have any lab test that is abnormal or we need to change your treatment, we will call you to review the results.   Testing/Procedures: None ordered    Follow-Up: Follow up as scheduled    Other Instructions None    Signed, Angelena Form, PA-C  05/01/2021 8:01 PM    [facility]

## 2021-05-01 NOTE — Patient Instructions (Signed)
Medication Instructions:  Discontinue Aspirin after you finish your current bottle   *If you need a refill on your cardiac medications before your next appointment, please call your pharmacy*   Lab Work: Tsh- Today   If you have labs (blood work) drawn today and your tests are completely normal, you will receive your results only by: [place] (if you have MyChart) OR A paper copy in the mail If you have any lab test that is abnormal or we need to change your treatment, we will call you to review the results.   Testing/Procedures: None ordered    Follow-Up: Follow up as scheduled    Other Instructions None

## 2021-05-02 LAB — TSH: TSH: 2.42 u[IU]/mL (ref 0.450–4.500)

## 2021-05-09 ENCOUNTER — Telehealth: Payer: Self-pay | Admitting: *Deleted

## 2021-05-09 NOTE — Telephone Encounter (Signed)
   [facility] Pre-operative Risk Assessment    Patient Name: Nicholas Sutton  DOB: [birth year] MRN: [id number]  HEARTCARE STAFF:  - IMPORTANT!!!!!! Under Visit Info/Reason for Call, type in Other and utilize the format Clearance MM/DD/YY or Clearance TBD. Do not use dashes or single digits. - Please review there is not already an duplicate clearance open for this procedure. - If request is for dental extraction, please clarify the # of teeth to be extracted. - If the patient is currently at the dentist's office, call Pre-Op Callback Staff (MA/nurse) to input urgent request.  - If the patient is not currently in the dentist office, please route to the Pre-Op pool.  Request for surgical clearance:  What type of surgery is being performed?  OPEN REPAIR OF RECURRENT RIGHT INGUINAL HERNIA  When is this surgery scheduled?  TBD  What type of clearance is required (medical clearance vs. Pharmacy clearance to hold med vs. Both)?  BOTH  Are there any medications that need to be held prior to surgery and how long?  ASPIRIN & ELIQUIS  Practice name and name of physician performing surgery?  [facility] / DR. Georgette [place]  What is the office phone number?  [phone]   7.   What is the office fax number?  [phone]  8.   Anesthesia type (None, local, MAC, general) ?  GENERAL    Jeanann Lewandowsky 05/09/2021, 10:32 AM  _________________________________________________________________   (provider comments below)

## 2021-05-13 NOTE — Telephone Encounter (Signed)
Patient with diagnosis of afib on Eliquis for anticoagulation.    Procedure: open repair of recurrent right inguinal hernia Date of procedure: TBD  CHA2DS2-VASc Score = 3  This indicates a 3.2% annual risk of stroke. The patient's score is based upon: CHF History: 1 HTN History: 0 Diabetes History: 0 Stroke History: 0 Vascular Disease History: 1 Age Score: 1 Gender Score: 0  CrCl 70mL/min Platelet count 101K  Per office protocol, patient should be able to hold Eliquis for 2 days prior to procedure, however will forward to structural team to confirm since pt recently underwent TAVR on 04/02/21. Will also have them clarify aspirin use - request is asking pt to hold, OV 10/12 mentions pt no longer taking aspirin but it remains on his med list.

## 2021-05-13 NOTE — Telephone Encounter (Signed)
   Name: Nicholas Sutton DOB: 1945-12-14  MRN: [id number]  Primary Cardiologist: Lauree Chandler, MD  Chart reviewed as part of pre-operative protocol coverage.   75 year old male with heart failure with improved ejection fraction, persistent atrial fibrillation, aortic stenosis status post transcatheter aortic valve replacement 04/02/2021, mild nonobstructive coronary artery disease by cardiac catheterization 7/22, LBBB, AAA.  He was last seen in clinic by Nell Range, PA-C 05/01/2021 for follow-up s/p TAVR.  Patient should be on Apixaban alone.  Notes from structural heart clinic indicate aspirin was to be continued for just 30 days post TAVR.  Given recent transcatheter aortic valve replacement, will discuss with Dr. Angelena Form when it would be safe to proceed with hernia repair.  Then, the patient can be contacted for clinical/functional evaluation/update.  Richardson Dopp, PA-C 05/13/2021, 2:13 PM

## 2021-05-14 NOTE — Telephone Encounter (Signed)
   Patient Name: Nicholas Sutton  DOB: 07-Nov-1945 MRN: [id number]  Primary Cardiologist: Lauree Chandler, MD  Chart reviewed as part of pre-operative protocol coverage, requested for hernia surgery. Patient just recently had TAVR 04/02/21 - Dr. Angelena Form recommends to wait 3 months post-TAVR for hernia repair unless it is urgent. Will route to callback team to call surgeon to inquire about urgency. If urgent, please let us know and we will take back to Dr. Angelena Form for review. If not urgent, would keep f/u as scheduled 07/2021 and add preop FYI to appointment notes so MD aware (can see if this can be moved up on structural schedule by a couple weeks so it's at the 3 month mark post TAVR).  Charlie Pitter, PA-C 05/14/2021, 2:31 PM

## 2021-05-14 NOTE — Telephone Encounter (Signed)
Placed call to [place] / Dr. Vonna Kotyk office, spoke to [place], [place].  She will find out from Dr. Georgette Dover about the urgency of the procedure and will let us know.

## 2021-05-16 NOTE — Telephone Encounter (Signed)
S/w triage nurse today for Dr. Georgette Dover, who confirmed Dr. Georgette Dover states procedure not urgent and ok to wait for appt 07/2021 with cardiologist for clearance. I will send to Dr. Angelena Form as Juluis Rainier.

## 2021-06-17 ENCOUNTER — Other Ambulatory Visit: Payer: Self-pay

## 2021-06-17 ENCOUNTER — Other Ambulatory Visit: Payer: Self-pay | Admitting: Cardiovascular Disease

## 2021-06-17 NOTE — Telephone Encounter (Signed)
Eliquis 5mg  refill request received. Patient is 75 years old, weight-74.8kg, Crea-0.83 on 04/03/2021, Diagnosis-Afib, and last seen by Bonney Leitz, PA on 05/01/2021. Dose is appropriate based on dosing criteria. Will send in refill to requested pharmacy.

## 2021-06-17 NOTE — Telephone Encounter (Signed)
Duplicate Eliquis request; original was sent to the requested pharmacy to day at 0749am. Will deny since already sent

## 2021-07-01 ENCOUNTER — Telehealth: Payer: Self-pay | Admitting: Cardiovascular Disease

## 2021-07-01 MED ORDER — APIXABAN 5 MG PO TABS: ORAL_TABLET | ORAL | 1 refills | Status: DC

## 2021-07-01 NOTE — Telephone Encounter (Signed)
Prescription refill request for Eliquis received. Indication: Atrial fib Last office visit: 05/01/21  K. Thompson PA-C Scr: 0.83 on 04/03/21 Age: 75 Weight: 74.8kg  Based on above findings Eliquis 5mg  twice daily is the appropriate dose.  Refill approved.

## 2021-07-01 NOTE — Telephone Encounter (Signed)
*  STAT* If patient is at the pharmacy, call can be transferred to refill team.   1. Which medications need to be refilled? (please list name of each medication and dose if known) ELIQUIS 5 MG TABS tablet  2. Which pharmacy/location (including street and city if local pharmacy) is medication to be sent to? [facility] [place], [address] [facility] [address]  3. Do they need a 30 day or 90 day supply? [place]

## 2021-07-18 ENCOUNTER — Ambulatory Visit: Payer: Medicare Other | Admitting: Cardiovascular Disease

## 2021-07-29 ENCOUNTER — Telehealth: Payer: Self-pay | Admitting: Cardiovascular Disease

## 2021-07-29 NOTE — Telephone Encounter (Signed)
Pt c/o medication issue:  1. Name of Medication: amoxicillin (AMOXIL) 500 MG tablet  2. How are you currently taking this medication (dosage and times per day)? Take 500 mg by mouth as directed. Take 4 tablets by mouth 1 hour prior to dental work  3. Are you having a reaction (difficulty breathing--STAT)? No   4. What is your medication issue? Patient not  sure if he should take all 4 tablets at one time or just 1 before dental appt

## 2021-07-29 NOTE — Telephone Encounter (Signed)
Spoke with the patient and advised him that he should take 4 capsules prior to dental work. Patient verbalized understanding.

## 2021-07-31 ENCOUNTER — Encounter: Payer: Self-pay | Admitting: Cardiovascular Disease

## 2021-07-31 ENCOUNTER — Ambulatory Visit (INDEPENDENT_AMBULATORY_CARE_PROVIDER_SITE_OTHER): Payer: Medicare Other | Admitting: Cardiovascular Disease

## 2021-07-31 ENCOUNTER — Other Ambulatory Visit: Payer: Self-pay

## 2021-07-31 VITALS — BP 114/72 | HR 86 | Ht 72.0 in | Wt 166.6 lb

## 2021-07-31 DIAGNOSIS — Z952 Presence of prosthetic heart valve: Secondary | ICD-10-CM

## 2021-07-31 DIAGNOSIS — I350 Nonrheumatic aortic (valve) stenosis: Secondary | ICD-10-CM

## 2021-07-31 DIAGNOSIS — I4819 Other persistent atrial fibrillation: Secondary | ICD-10-CM | POA: Diagnosis not present

## 2021-07-31 DIAGNOSIS — Z01810 Encounter for preprocedural cardiovascular examination: Secondary | ICD-10-CM | POA: Diagnosis not present

## 2021-07-31 DIAGNOSIS — I2510 Atherosclerotic heart disease of native coronary artery without angina pectoris: Secondary | ICD-10-CM

## 2021-07-31 DIAGNOSIS — E7800 Pure hypercholesterolemia, unspecified: Secondary | ICD-10-CM

## 2021-07-31 NOTE — Patient Instructions (Signed)
Medication Instructions:  Your physician recommends that you continue on your current medications as directed. Please refer to the Current Medication list given to you today.  *If you need a refill on your cardiac medications before your next appointment, please call your pharmacy*   Lab Work: None today  If you have labs (blood work) drawn today and your tests are completely normal, you will receive your results only by: [place] (if you have MyChart) OR A paper copy in the mail If you have any lab test that is abnormal or we need to change your treatment, we will call you to review the results.   Testing/Procedures: None today    Follow-Up: At [facility], you and your health needs are our priority.  As part of our continuing mission to provide you with exceptional heart care, we have created designated Provider Care Teams.  These Care Teams include your primary Cardiologist (physician) and Advanced Practice Providers (APPs -  Physician Assistants and Nurse Practitioners) who all work together to provide you with the care you need, when you need it.  We recommend signing up for the patient portal called "MyChart".  Sign up information is provided on this After Visit Summary.  MyChart is used to connect with patients for Virtual Visits (Telemedicine).  Patients are able to view lab/test results, encounter notes, upcoming appointments, etc.  Non-urgent messages can be sent to your provider as well.   To learn more about what you can do with MyChart, go to [email or web address].    Your next appointment:   1 year  The format for your next appointment:   In person with Dr. Angelena Form    Other Instructions You will follow up one year after your procedure which was in September of 2022, so your follow up will be in September of 2023 and you will see Dr. Angelena Form afterwards.

## 2021-07-31 NOTE — Progress Notes (Signed)
Chief Complaint  Patient presents with   Follow-up    Aortic valve disease    History of Present Illness: 76 yo male with history of non-ischemic cardiomyopathy, former tobacco abuse, persistent atrial fibrillation, severe aortic stenosis s/p TAVR and CAD who is here today for follow up. He was followed for aortic stenosis for several years and then underwent  TAVR with a 23 mm Edwards Sapien 3 THV on 04/02/21.  Cardiac cath July 2022 with mild CAD. Echo 05/01/21 with LVEF=50-55%, normal RV function. Severe dilation both atria. Normally functioning AVR with mean gradient 12 mmHg.   He is here today for follow up. The patient denies any chest pain, dyspnea, palpitations, lower extremity edema, orthopnea, PND, dizziness, near syncope or syncope.    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 Angelena Form & Dr. Cyndia Bent   Severe aortic stenosis     Past Surgical History:  Procedure Laterality Date   APPENDECTOMY     COLON RESECTION     COLOSTOMY REVISION N/A 07/06/2013   Procedure: COLON RESECTION SIGMOID;  Surgeon: Edward Jolly, MD;  Location: [facility];  Service: General;  Laterality: N/A;   FISTULOTOMY N/A 07/06/2013   Procedure: repair of colovesicle fistula/ FISTULOTOMY;  Surgeon: Edward Jolly, MD;  Location: [facility];  Service: General;  Laterality: N/A;   GUM SURGERY     HERNIA REPAIR     INGUINAL HERNIA REPAIR Left 10/10/2015   Procedure: LEFT INGUINAL HERNIA REPAIR ;  Surgeon: Donnie Mesa, MD;  Location: [place];  Service: General;  Laterality: Left;   INSERTION OF MESH  Left 10/10/2015   Procedure: INSERTION OF MESH;  Surgeon: Donnie Mesa, MD;  Location: [place];  Service: General;  Laterality: Left;   RIGHT/LEFT HEART CATH AND CORONARY ANGIOGRAPHY N/A 10/09/2016   Procedure: Right/Left Heart Cath and Coronary Angiography;  Surgeon: Nelva Bush, MD;  Location: [place] [facility];  Service: Cardiovascular;  Laterality: N/A;   RIGHT/LEFT HEART CATH AND CORONARY ANGIOGRAPHY N/A 02/13/2021   Procedure: RIGHT/LEFT HEART CATH AND CORONARY ANGIOGRAPHY;  Surgeon: Burnell Blanks, MD;  Location: [place] [facility];  Service: Cardiovascular;  Laterality: N/A;   TEE WITHOUT CARDIOVERSION N/A 04/02/2021   Procedure: TRANSESOPHAGEAL ECHOCARDIOGRAM (TEE);  Surgeon: Burnell Blanks, MD;  Location: [place] [facility];  Service: Open Heart Surgery;  Laterality: N/A;   TRANSCATHETER AORTIC VALVE REPLACEMENT, TRANSFEMORAL N/A 04/02/2021   Procedure: TRANSCATHETER AORTIC VALVE REPLACEMENT, TRANSFEMORAL;  Surgeon: Burnell Blanks, MD;  Location: [place] [facility];  Service: Open Heart Surgery;  Laterality: N/A;    Current Outpatient Medications  Medication Sig Dispense Refill   acetaminophen (TYLENOL) 500 MG tablet Take 1,000 mg by mouth every 6 (six) hours as needed for headache.     amoxicillin (AMOXIL) 500 MG tablet Take 500 mg by mouth as directed. Take 4 tablets by mouth 1 hour prior to dental work     apixaban (ELIQUIS) 5 MG TABS tablet TAKE 1 TABLET(5 MG) BY MOUTH TWICE DAILY 180 tablet 1   Bioflavonoid  Products (ESTER-C) 500-550 MG TABS Take 1 tablet by mouth in the morning.     carvedilol (COREG) 3.125 MG tablet Take 1 tablet (3.125 mg total) by mouth 2 (two) times daily. 180 tablet 2   cholecalciferol (VITAMIN D) 25 MCG (1000 UNIT) tablet Take 1,000 Units by mouth in the morning and at bedtime.     loratadine (CLARITIN) 10 MG tablet Take 10 mg by mouth daily as needed for allergies.     losartan (COZAAR) 25 MG tablet TAKE 1 TABLET(25 MG) BY MOUTH  DAILY 90 tablet 3   Multiple Vitamins-Minerals (PRESERVISION AREDS 2 PO) Take 1 tablet by mouth in the morning and at bedtime.     Omega-3 Fatty Acids (FISH OIL) 1000 MG CAPS Take 1,000 mg by mouth in the morning and at bedtime.     tadalafil (CIALIS) 5 MG tablet Take 5 mg by mouth daily as needed for erectile dysfunction.     vitamin C (ASCORBIC ACID) 500 MG tablet Take 500 mg by mouth every evening.     No current facility-administered medications for this visit.    Allergies  Allergen Reactions   Dust Mite Extract Other (See Comments)    Sneezing   Pollen Extract Other (See Comments)    Sneezing    Social History   Socioeconomic History   Marital status: Divorced    Spouse name: Not on file   Number of children: Not on file   Years of education: Not on file   Highest education level: Not on file  Occupational History   Not on file  Tobacco Use   Smoking status: Former    Packs/day: 1.50    Years: 30.00    Pack years: 45.00    Types: Cigarettes    Quit date: 06/03/2008    Years since quitting: 13.1   Smokeless tobacco: Never  Vaping Use   Vaping Use: Never used  Substance and Sexual Activity   Alcohol use: Not Currently    Comment: rare   Drug use: No   Sexual activity: Never    Birth control/protection: None  Other Topics Concern   Not on file  Social History Narrative   Not on file   Social Determinants of Health   Financial Resource Strain: Not on file  Food Insecurity: Not on file  Transportation Needs: Not on file  Physical Activity: Not on file  Stress: Not on file  Social Connections: Not on file  Intimate Partner Violence: Not on file    Family History  Problem Relation Age of Onset   Stroke Mother 38   Heart attack Father 59   Hypertension Unknown     Review of Systems:  As stated in the HPI and otherwise negative.   BP 114/72    Pulse 86    Ht 6' (1.829 m)    Wt 166 lb 9.6 oz (75.6 kg)    SpO2 97%    BMI 22.60 kg/m   Physical  Examination:  General: Well developed, well nourished, NAD  HEENT: OP clear, mucus membranes moist  SKIN: warm, dry. No rashes. Neuro: No focal deficits  Musculoskeletal: Muscle strength 5/5 all ext  Psychiatric: Mood and affect normal  Neck: No JVD, no carotid bruits, no thyromegaly, no lymphadenopathy.  Lungs:Clear bilaterally, no wheezes, rhonci, crackles Cardiovascular: Regular rate and rhythm.  No murmurs, gallops or rubs. Abdomen:Soft. Bowel sounds present. Non-tender.  Extremities: No lower extremity edema. Pulses are 2 + in the bilateral DP/PT.  .EKG:  EKG is not ordered today. The ekg ordered today demonstrates   Echo 05/01/21:   1. Left ventricular ejection fraction, by estimation, is 50 to 55%. The  left ventricle has low normal function. The left ventricle has no regional  wall motion abnormalities. Left ventricular diastolic function could not  be evaluated.   2. Right ventricular systolic function is normal. The right ventricular  size is normal.   3. Left atrial size was severely dilated.   4. Right atrial size was severely dilated.   5. The mitral valve is normal in structure. Trivial mitral valve  regurgitation. No evidence of mitral stenosis.   6. Tricuspid valve regurgitation is mild to moderate.   7. The aortic valve has been repaired/replaced. Aortic valve  regurgitation is not visualized. Mild aortic valve stenosis. Echo findings  are consistent with normal structure and function of the aortic valve  prosthesis. Aortic valve mean gradient measures   12.0 mmHg. Aortic valve Vmax measures 2.29 m/s.   8. The inferior vena cava is normal in size with greater than 50%  respiratory variability, suggesting right atrial pressure of 3 mmHg.   Recent Labs: 03/29/2021: ALT 24 04/03/2021: BUN 12; Creatinine, Ser 0.83; Hemoglobin 13.1; Platelets 101; Potassium 4.1; Sodium 136 05/01/2021: TSH 2.420   Lipid Panel    Component Value Date/Time   CHOL 160 01/09/2020  0816   TRIG 55 01/09/2020 0816   HDL 42 01/09/2020 0816   CHOLHDL 3.8 01/09/2020 0816   LDLCALC 107 (H) 01/09/2020 0816     Wt Readings from Last 3 Encounters:  07/31/21 166 lb 9.6 oz (75.6 kg)  05/01/21 165 lb (74.8 kg)  04/11/21 168 lb (76.2 kg)     Other studies Reviewed: Additional studies/ records that were reviewed today include:  Review of the above records demonstrates:   Assessment and Plan:   1. Atrial fibrillation, persistent: Atrial fib today with controlled rate. Continue Coreg, Eliquis.   2. Non-ischemic cardiomyopathy: LV function normal by echo October 2022.   3. Severe Aortic stenosis:  he is s/p TAVR. AVR working well by echo October 2022. No ASA since he is on Eliquis. He will use antibiotics for SBE prophylaxis.   4. Hyperlipidemia: LDL 107. He refuses statin therapy. He has never failed a statin but read about side effects and has refused. We talked about Zetia today. He will think about this.  Could consider Repatha but since he refuses statins not sure if he would qualify without a statin trial.   5. CAD without angina: Mild CAD by cath in 2022. He has no chest pain. Continue beta blocker. He refuses statin therapy.   6. Pre-operative cardiovascular examination: He can proceed with his planned surgical procedure (hernia repair). OK to hold Eliquis 3 days before his procedure.    Current medicines are reviewed at length with the patient today.  The patient does not have concerns regarding medicines.  The following changes have been made:  no change  Labs/ tests ordered today include:   No orders of the defined types were placed in this encounter.    Disposition:   F/U in September in valve clinic.   Signed, Lauree Chandler, MD 07/31/2021 3:14 PM    [facility] [place], [place], [place]  [address] Phone: [phone]; Fax: [phone]

## 2021-08-02 ENCOUNTER — Ambulatory Visit: Payer: Self-pay | Admitting: General Surgery

## 2021-08-02 ENCOUNTER — Telehealth: Payer: Self-pay | Admitting: Cardiovascular Disease

## 2021-08-02 DIAGNOSIS — Z952 Presence of prosthetic heart valve: Secondary | ICD-10-CM | POA: Diagnosis not present

## 2021-08-02 DIAGNOSIS — I2510 Atherosclerotic heart disease of native coronary artery without angina pectoris: Secondary | ICD-10-CM

## 2021-08-02 DIAGNOSIS — K4011 Bilateral inguinal hernia, with gangrene, recurrent: Secondary | ICD-10-CM | POA: Diagnosis not present

## 2021-08-02 NOTE — H&P (View-Only) (Signed)
Chief Complaint: Left and right hernia in groin area       History of Present Illness: Nicholas Sutton is a 76 y.o. male who is seen today as an office consultation at the request of Dr. Pasty Arch for evaluation of Left and right hernia in groin area .   Patient is a 76 year old male who comes in with a recurrent left inguinal hernia and a new right inguinal hernia. Patient does have a history of a previous sigmoid colon resection 2014 by Dr. Excell Seltzer, a open left inguinal hernia pair 1984 and 2016.   Patient states that the left-sided recurrence has been there for approximately a year.  He states that the right-sided hernia has been there over the last several months.  He states he has no pain and would like to have this repaired prior to the increase in size her symptoms.   Patient's had no signs or symptoms of incarceration or strangulation.     Review of Systems: A complete review of systems was obtained from the patient.  I have reviewed this information and discussed as appropriate with the patient.  See HPI as well for other ROS.   Review of Systems  Constitutional: Negative for fever.  HENT: Negative for congestion.   Eyes: Negative for blurred vision.  Respiratory: Negative for cough, shortness of breath and wheezing.   Cardiovascular: Negative for chest pain and palpitations.  Gastrointestinal: Negative for heartburn.  Genitourinary: Negative for dysuria.  Musculoskeletal: Negative for myalgias.  Skin: Negative for rash.  Neurological: Negative for dizziness and headaches.  Psychiatric/Behavioral: Negative for depression and suicidal ideas.  All other systems reviewed and are negative.       Medical History: Past Medical History Past Medical History: Diagnosis Date  Heart valve disease        There is no problem list on file for this patient.     Past Surgical History Past Surgical History: Procedure Laterality Date  LAPAROSCOPIC COLON RESECTION   2014  HERNIA  REPAIR          Allergies Not on File    Current Outpatient Medications on File Prior to Visit Medication Sig Dispense Refill  apixaban (ELIQUIS) 5 mg tablet Eliquis 5 mg tablet      carvediloL (COREG) 3.125 MG tablet carvedilol 3.125 mg tablet      losartan (COZAAR) 25 MG tablet losartan 25 mg tablet      acetaminophen (TYLENOL) 500 MG tablet Take by mouth      amoxicillin (AMOXIL) 500 MG tablet Take by mouth      ascorbic acid, vitamin C, 500 mg Chew Take by mouth      aspirin 325 MG tablet Take by mouth      chlorhexidine (PERIDEX) 0.12 % solution chlorhexidine gluconate 0.12 % mouthwash      cholecalciferol (VITAMIN D3) 1000 unit tablet Take by mouth      docosahexaenoic acid-epa 120-180 mg Cap Take by mouth      HYDROcodone-acetaminophen (NORCO) 5-325 mg tablet hydrocodone 5 mg-acetaminophen 325 mg tablet      loratadine (CLARITIN) 10 mg tablet Take by mouth      tadalafiL (CIALIS) 5 MG tablet tadalafil 5 mg tablet  TAKE 1 TABLET PRIOR TO INTERCOURSE      varicella virus vaccine, recombinant, (SHINGRIX, PF,) IM injection Shingrix (PF) 50 mcg/0.5 mL intramuscular suspension, kit       No current facility-administered medications on file prior to visit.     Family History Family History  Problem Relation Age of Onset  Stroke Mother    Coronary Artery Disease (Blocked arteries around heart) Father        Social History   Tobacco Use Smoking Status Former  Types: Cigarettes Smokeless Tobacco Never     Social History Social History    Socioeconomic History  Marital status: Divorced Tobacco Use  Smoking status: Former     Types: Cigarettes  Smokeless tobacco: Never Substance and Sexual Activity  Alcohol use: Never  Drug use: Never      Objective:     Vitals:   08/02/21 1006 BP: 110/82 Pulse: 87 Temp: 36.4 C (97.5 F) SpO2: 100% Weight: 75 kg (165 lb 6.4 oz) Height: 179.1 cm (5' 10.5")   Body mass index is 23.4 kg/m. Physical  Exam Constitutional:      Appearance: Normal appearance.  HENT:     Head: Normocephalic and atraumatic.     Nose: Nose normal. No congestion.     Mouth/Throat:     Mouth: Mucous membranes are moist.     Pharynx: Oropharynx is clear.  Eyes:     Pupils: Pupils are equal, round, and reactive to light.  Cardiovascular:     Rate and Rhythm: Normal rate and regular rhythm.     Pulses: Normal pulses.     Heart sounds: Normal heart sounds. No murmur heard.   No friction rub. No gallop.  Pulmonary:     Effort: Pulmonary effort is normal. No respiratory distress.     Breath sounds: Normal breath sounds. No stridor. No wheezing, rhonchi or rales.  Abdominal:     General: Abdomen is flat.     Hernia: A hernia is present. Hernia is present in the left inguinal area and right inguinal area.  Musculoskeletal:        General: Normal range of motion.     Cervical back: Normal range of motion.  Skin:    General: Skin is warm and dry.  Neurological:     General: No focal deficit present.     Mental Status: He is alert and oriented to person, place, and time.  Psychiatric:        Mood and Affect: Mood normal.        Thought Content: Thought content normal.          Assessment and Plan: Diagnoses and all orders for this visit:   Bilateral recurrent inguinal hernia with gangrene     Nicholas Sutton is a 76 y.o. male    1.  We will proceed to the OR for a robotic bilateral inguinal hernia repair with mesh. 2. All risks and benefits were discussed with the patient, to generally include infection, bleeding, damage to surrounding structures, acute and chronic nerve pain, and recurrence. Alternatives were offered and described.  All questions were answered and the patient voiced understanding of the procedure and wishes to proceed at this point.             No follow-ups on file.   Ralene Ok, MD, [facility], [facility]

## 2021-08-02 NOTE — H&P (Signed)
Chief Complaint: Left and right hernia in groin area       History of Present Illness: Nicholas Sutton is a 76 y.o. male who is seen today as an office consultation at the request of Dr. Pasty Arch for evaluation of Left and right hernia in groin area .   Patient is a 76 year old male who comes in with a recurrent left inguinal hernia and a new right inguinal hernia. Patient does have a history of a previous sigmoid colon resection 2014 by Dr. Excell Seltzer, a open left inguinal hernia pair 1984 and 2016.   Patient states that the left-sided recurrence has been there for approximately a year.  He states that the right-sided hernia has been there over the last several months.  He states he has no pain and would like to have this repaired prior to the increase in size her symptoms.   Patient's had no signs or symptoms of incarceration or strangulation.     Review of Systems: A complete review of systems was obtained from the patient.  I have reviewed this information and discussed as appropriate with the patient.  See HPI as well for other ROS.   Review of Systems  Constitutional: Negative for fever.  HENT: Negative for congestion.   Eyes: Negative for blurred vision.  Respiratory: Negative for cough, shortness of breath and wheezing.   Cardiovascular: Negative for chest pain and palpitations.  Gastrointestinal: Negative for heartburn.  Genitourinary: Negative for dysuria.  Musculoskeletal: Negative for myalgias.  Skin: Negative for rash.  Neurological: Negative for dizziness and headaches.  Psychiatric/Behavioral: Negative for depression and suicidal ideas.  All other systems reviewed and are negative.       Medical History: Past Medical History Past Medical History: Diagnosis Date  Heart valve disease        There is no problem list on file for this patient.     Past Surgical History Past Surgical History: Procedure Laterality Date  LAPAROSCOPIC COLON RESECTION   2014  HERNIA  REPAIR          Allergies Not on File    Current Outpatient Medications on File Prior to Visit Medication Sig Dispense Refill  apixaban (ELIQUIS) 5 mg tablet Eliquis 5 mg tablet      carvediloL (COREG) 3.125 MG tablet carvedilol 3.125 mg tablet      losartan (COZAAR) 25 MG tablet losartan 25 mg tablet      acetaminophen (TYLENOL) 500 MG tablet Take by mouth      amoxicillin (AMOXIL) 500 MG tablet Take by mouth      ascorbic acid, vitamin C, 500 mg Chew Take by mouth      aspirin 325 MG tablet Take by mouth      chlorhexidine (PERIDEX) 0.12 % solution chlorhexidine gluconate 0.12 % mouthwash      cholecalciferol (VITAMIN D3) 1000 unit tablet Take by mouth      docosahexaenoic acid-epa 120-180 mg Cap Take by mouth      HYDROcodone-acetaminophen (NORCO) 5-325 mg tablet hydrocodone 5 mg-acetaminophen 325 mg tablet      loratadine (CLARITIN) 10 mg tablet Take by mouth      tadalafiL (CIALIS) 5 MG tablet tadalafil 5 mg tablet  TAKE 1 TABLET PRIOR TO INTERCOURSE      varicella virus vaccine, recombinant, (SHINGRIX, PF,) IM injection Shingrix (PF) 50 mcg/0.5 mL intramuscular suspension, kit       No current facility-administered medications on file prior to visit.     Family History Family History  Problem Relation Age of Onset  Stroke Mother    Coronary Artery Disease (Blocked arteries around heart) Father        Social History   Tobacco Use Smoking Status Former  Types: Cigarettes Smokeless Tobacco Never     Social History Social History    Socioeconomic History  Marital status: Divorced Tobacco Use  Smoking status: Former     Types: Cigarettes  Smokeless tobacco: Never Substance and Sexual Activity  Alcohol use: Never  Drug use: Never      Objective:     Vitals:   08/02/21 1006 BP: 110/82 Pulse: 87 Temp: 36.4 C (97.5 F) SpO2: 100% Weight: 75 kg (165 lb 6.4 oz) Height: 179.1 cm (5' 10.5")   Body mass index is 23.4 kg/m. Physical  Exam Constitutional:      Appearance: Normal appearance.  HENT:     Head: Normocephalic and atraumatic.     Nose: Nose normal. No congestion.     Mouth/Throat:     Mouth: Mucous membranes are moist.     Pharynx: Oropharynx is clear.  Eyes:     Pupils: Pupils are equal, round, and reactive to light.  Cardiovascular:     Rate and Rhythm: Normal rate and regular rhythm.     Pulses: Normal pulses.     Heart sounds: Normal heart sounds. No murmur heard.   No friction rub. No gallop.  Pulmonary:     Effort: Pulmonary effort is normal. No respiratory distress.     Breath sounds: Normal breath sounds. No stridor. No wheezing, rhonchi or rales.  Abdominal:     General: Abdomen is flat.     Hernia: A hernia is present. Hernia is present in the left inguinal area and right inguinal area.  Musculoskeletal:        General: Normal range of motion.     Cervical back: Normal range of motion.  Skin:    General: Skin is warm and dry.  Neurological:     General: No focal deficit present.     Mental Status: He is alert and oriented to person, place, and time.  Psychiatric:        Mood and Affect: Mood normal.        Thought Content: Thought content normal.          Assessment and Plan: Diagnoses and all orders for this visit:   Bilateral recurrent inguinal hernia with gangrene     Nicholas Sutton is a 76 y.o. male    1.  We will proceed to the OR for a robotic bilateral inguinal hernia repair with mesh. 2. All risks and benefits were discussed with the patient, to generally include infection, bleeding, damage to surrounding structures, acute and chronic nerve pain, and recurrence. Alternatives were offered and described.  All questions were answered and the patient voiced understanding of the procedure and wishes to proceed at this point.             No follow-ups on file.   Ralene Ok, MD, [facility], [place] [facility]

## 2021-08-02 NOTE — Telephone Encounter (Signed)
Attempted to call the pt but phone rang several times no answer.. will try again later.

## 2021-08-02 NOTE — Telephone Encounter (Signed)
Patient would like a lipid panel order to be placed for him. He would like to have it done a [place].

## 2021-08-06 NOTE — Telephone Encounter (Signed)
Left a message to call back.

## 2021-08-07 NOTE — Telephone Encounter (Signed)
° °  Pt is returning call, he said, if he unable to answer phone to leave him a detailed message

## 2021-08-07 NOTE — Telephone Encounter (Signed)
Spoke with the patient. He reports Dr. Angelena Form has recommended he take zetia and then discussed Repatha/Praluent w him but his lipids are not up to date and he has been working on his diet.  He's ready to have fasting labs and then discuss what is best for him to do based on the lab results.  He will go to [facility] for labs.  Order placed for lipid panel.

## 2021-08-13 ENCOUNTER — Other Ambulatory Visit (HOSPITAL_COMMUNITY)
Admission: RE | Admit: 2021-08-13 | Discharge: 2021-08-13 | Disposition: A | Discharge status: Home / Self Care | Payer: Medicare Other | Source: Ambulatory Visit | Attending: Cardiovascular Disease | Admitting: Cardiovascular Disease

## 2021-08-13 DIAGNOSIS — I2510 Atherosclerotic heart disease of native coronary artery without angina pectoris: Secondary | ICD-10-CM | POA: Diagnosis not present

## 2021-08-13 LAB — LIPID PANEL
Cholesterol: 202 mg/dL — ABNORMAL HIGH (ref 0–200)
HDL: 41 mg/dL (ref >40)
LDL Cholesterol: 152 mg/dL — ABNORMAL HIGH (ref 0–99)
Total CHOL/HDL Ratio: 4.9 RATIO
Triglycerides: 46 mg/dL (ref <150)
VLDL: 9 mg/dL (ref 0–40)

## 2021-08-13 NOTE — Progress Notes (Signed)
Surgical Instructions    Your procedure is scheduled on Tuesday January 31st.  Report to [facility] Main Entrance "A" at 10:30 A.M., then check in with the Admitting office.  Call this number if you have problems the morning of surgery:  [phone]   If you have any questions prior to your surgery date call [phone]: Open Monday-Friday 8am-4pm    Remember:  Do not eat after midnight the night before your surgery  You may drink clear liquids until 9:30 am the morning of your surgery.   Clear liquids allowed are: Water, Non-Citrus Juices (without pulp), Carbonated Beverages, Clear Tea, Black Coffee ONLY (NO MILK, CREAM OR POWDERED CREAMER of any kind), and Gatorade   Enhanced Recovery after Surgery  Enhanced Recovery after Surgery is a protocol used to improve the stress on your body and your recovery after surgery.  Patient Instructions  The day of surgery (if you do NOT have diabetes):  Drink ONE (1) Pre-Surgery Clear Ensure by ___9:30__ am the morning of surgery   This drink was given to you during your hospital  pre-op appointment visit. Nothing else to drink after completing the  Pre-Surgery Clear Ensure.          If you have questions, please contact your surgeons office.    Take these medicines the morning of surgery with A SIP OF WATER carvedilol (COREG) 3.125 MG tablet  IF NEEDED  acetaminophen (TYLENOL) 500 MG tablet Polyvinyl Alcohol-Povidone PF (REFRESH) 1.4-0.6 % SOLN   Follow your surgeon's instructions on when to stop Eliquis.  If no instructions were given by your surgeon then you will need to call the office to get those instructions.     As of today, STOP taking any Aspirin (unless otherwise instructed by your surgeon) Aleve, Naproxen, Ibuprofen, Motrin, Advil, Goody's, BC's, all herbal medications, fish oil, and all vitamins.    Notify your provider: if you are in close contact with someone who has COVID  or if you develop a fever of 100.4 or  greater, sneezing, cough, sore throat, shortness of breath or body aches.           Do not wear jewelry  Do not wear lotions, powders, colognes, or deodorant. Do not shave 48 hours prior to surgery.  Men may shave face and neck. Do not bring valuables to the hospital. DO Not wear nail polish, gel polish, artificial nails, or any other type of covering on natural nails (fingers and toes) If you have artificial nails or gel coating that need to be removed by a nail salon, please have this removed prior to surgery. Artificial nails or gel coating may interfere with anesthesia's ability to adequately monitor your vital signs.             [place] is not responsible for any belongings or valuables.  Do NOT Smoke (Tobacco/Vaping)  24 hours prior to your procedure  If you use a CPAP at night, you may bring your mask for your overnight stay.   Contacts, glasses, hearing aids, dentures or partials may not be worn into surgery, please bring cases for these belongings   For patients admitted to the hospital, discharge time will be determined by your treatment team.   Patients discharged the day of surgery will not be allowed to drive home, and someone needs to stay with them for 24 hours.  NO VISITORS WILL BE ALLOWED IN PRE-OP WHERE PATIENTS ARE PREPPED FOR SURGERY.  ONLY 1 SUPPORT PERSON MAY BE PRESENT  IN THE WAITING ROOM WHILE YOU ARE IN SURGERY.  IF YOU ARE TO BE ADMITTED, ONCE YOU ARE IN YOUR ROOM YOU WILL BE ALLOWED TWO (2) VISITORS. 1 (ONE) VISITOR MAY STAY OVERNIGHT BUT MUST ARRIVE TO THE ROOM BY 8pm.  Minor children may have two parents present. Special consideration for safety and communication needs will be reviewed on a case by case basis.  Special instructions:    Oral Hygiene is also important to reduce your risk of infection.  Remember - BRUSH YOUR TEETH THE MORNING OF SURGERY WITH YOUR REGULAR TOOTHPASTE   [place]- Preparing For Surgery  Before surgery, you can play an  important role. Because skin is not sterile, your skin needs to be as free of germs as possible. You can reduce the number of germs on your skin by washing with CHG (chlorahexidine gluconate) Soap before surgery.  CHG is an antiseptic cleaner which kills germs and bonds with the skin to continue killing germs even after washing.     Please do not use if you have an allergy to CHG or antibacterial soaps. If your skin becomes reddened/irritated stop using the CHG.  Do not shave (including legs and underarms) for at least 48 hours prior to first CHG shower. It is OK to shave your face.  Please follow these instructions carefully.     Shower the NIGHT BEFORE SURGERY and the MORNING OF SURGERY with CHG Soap.   If you chose to wash your hair, wash your hair first as usual with your normal shampoo. After you shampoo, rinse your hair and body thoroughly to remove the shampoo.  Then [employer] and genitals (private parts) with your normal soap and rinse thoroughly to remove soap.  After that Use CHG Soap as you would any other liquid soap. You can apply CHG directly to the skin and wash gently with a scrungie or a clean washcloth.   Apply the CHG Soap to your body ONLY FROM THE NECK DOWN.  Do not use on open wounds or open sores. Avoid contact with your eyes, ears, mouth and genitals (private parts). Wash Face and genitals (private parts)  with your normal soap.   Wash thoroughly, paying special attention to the area where your surgery will be performed.  Thoroughly rinse your body with warm water from the neck down.  DO NOT shower/wash with your normal soap after using and rinsing off the CHG Soap.  Pat yourself dry with a CLEAN TOWEL.  Wear CLEAN PAJAMAS to bed the night before surgery  Place CLEAN SHEETS on your bed the night before your surgery  DO NOT SLEEP WITH PETS.   Day of Surgery:  Take a shower with CHG soap. Wear Clean/Comfortable clothing the morning of surgery Do not apply any  deodorants/lotions.   Remember to brush your teeth WITH YOUR REGULAR TOOTHPASTE.   Please read over the following fact sheets that you were given.

## 2021-08-14 ENCOUNTER — Other Ambulatory Visit: Payer: Self-pay

## 2021-08-14 ENCOUNTER — Encounter (HOSPITAL_COMMUNITY): Payer: Self-pay

## 2021-08-14 ENCOUNTER — Encounter (HOSPITAL_COMMUNITY)
Admission: RE | Admit: 2021-08-14 | Discharge: 2021-08-14 | Disposition: A | Discharge status: Home / Self Care | Payer: Medicare Other | Source: Ambulatory Visit | Attending: General Surgery | Admitting: General Surgery

## 2021-08-14 VITALS — BP 131/83 | HR 82 | Temp 97.7°F | Resp 17 | Ht 72.0 in | Wt 167.8 lb

## 2021-08-14 DIAGNOSIS — Z87891 Personal history of nicotine dependence: Secondary | ICD-10-CM | POA: Insufficient documentation

## 2021-08-14 DIAGNOSIS — Z952 Presence of prosthetic heart valve: Secondary | ICD-10-CM | POA: Diagnosis not present

## 2021-08-14 DIAGNOSIS — Z01812 Encounter for preprocedural laboratory examination: Secondary | ICD-10-CM | POA: Diagnosis not present

## 2021-08-14 DIAGNOSIS — K5792 Diverticulitis of intestine, part unspecified, without perforation or abscess without bleeding: Secondary | ICD-10-CM | POA: Insufficient documentation

## 2021-08-14 DIAGNOSIS — Z7901 Long term (current) use of anticoagulants: Secondary | ICD-10-CM | POA: Diagnosis not present

## 2021-08-14 DIAGNOSIS — I428 Other cardiomyopathies: Secondary | ICD-10-CM | POA: Diagnosis not present

## 2021-08-14 DIAGNOSIS — F419 Anxiety disorder, unspecified: Secondary | ICD-10-CM | POA: Diagnosis not present

## 2021-08-14 DIAGNOSIS — Z79899 Other long term (current) drug therapy: Secondary | ICD-10-CM | POA: Diagnosis not present

## 2021-08-14 DIAGNOSIS — R011 Cardiac murmur, unspecified: Secondary | ICD-10-CM | POA: Insufficient documentation

## 2021-08-14 DIAGNOSIS — I350 Nonrheumatic aortic (valve) stenosis: Secondary | ICD-10-CM | POA: Diagnosis not present

## 2021-08-14 DIAGNOSIS — I4891 Unspecified atrial fibrillation: Secondary | ICD-10-CM | POA: Diagnosis not present

## 2021-08-14 DIAGNOSIS — K4021 Bilateral inguinal hernia, without obstruction or gangrene, recurrent: Secondary | ICD-10-CM | POA: Insufficient documentation

## 2021-08-14 DIAGNOSIS — I447 Left bundle-branch block, unspecified: Secondary | ICD-10-CM | POA: Insufficient documentation

## 2021-08-14 HISTORY — DX: Cardiac arrhythmia, unspecified: I49.9

## 2021-08-14 LAB — CBC
HCT: 43.8 % (ref 39.0–52.0)
Hemoglobin: 14.5 g/dL (ref 13.0–17.0)
MCH: 30 pg (ref 26.0–34.0)
MCHC: 33.1 g/dL (ref 30.0–36.0)
MCV: 90.5 fL (ref 80.0–100.0)
Platelets: 110 10*3/uL — ABNORMAL LOW (ref 150–400)
RBC: 4.84 MIL/uL (ref 4.22–5.81)
RDW: 14.1 % (ref 11.5–15.5)
WBC: 6.6 10*3/uL (ref 4.0–10.5)
nRBC: 0 % (ref 0.0–0.2)

## 2021-08-14 LAB — BASIC METABOLIC PANEL
Anion gap: 7 (ref 5–15)
BUN: 20 mg/dL (ref 8–23)
CO2: 27 mmol/L (ref 22–32)
Calcium: 9.4 mg/dL (ref 8.9–10.3)
Chloride: 100 mmol/L (ref 98–111)
Creatinine, Ser: 0.95 mg/dL (ref 0.61–1.24)
GFR, Estimated: >60 mL/min (ref >60)
Glucose, Bld: 93 mg/dL (ref 70–99)
Potassium: 4.6 mmol/L (ref 3.5–5.1)
Sodium: 134 mmol/L — ABNORMAL LOW (ref 135–145)

## 2021-08-14 NOTE — Progress Notes (Signed)
PCP - Nicholas Sutton Cardiologist - Dr. Craig Staggers. 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 Patient agrees to go over the instructions while at home for a better understanding.  The opportunity to ask questions was provided.

## 2021-08-15 ENCOUNTER — Encounter (HOSPITAL_COMMUNITY): Payer: Self-pay

## 2021-08-15 NOTE — Anesthesia Preprocedure Evaluation (Addendum)
Anesthesia Evaluation  Patient identified by MRN, date of birth, ID band Patient awake    Reviewed: Allergy & Precautions, NPO status , Patient's Chart, lab work & pertinent test results  Airway Mallampati: III  TM Distance: >3 FB Neck ROM: Full    Dental  (+) Teeth Intact, Dental Advisory Given   Pulmonary former smoker,    breath sounds clear to auscultation       Cardiovascular hypertension, Pt. on medications + dysrhythmias + Valvular Problems/Murmurs AS  Rhythm:Regular Rate:Normal  Echo 05/01/21: IMPRESSIONS  1. Left ventricular ejection fraction, by estimation, is 50 to 55%. The  left ventricle has low normal function. The left ventricle has no regional  wall motion abnormalities. Left ventricular diastolic function could not  be evaluated.  2. Right ventricular systolic function is normal. The right ventricular  size is normal.  3. Left atrial size was severely dilated.  4. Right atrial size was severely dilated.  5. The mitral valve is normal in structure. Trivial mitral valve  regurgitation. No evidence of mitral stenosis.  6. Tricuspid valve regurgitation is mild to moderate.  7. The aortic valve has been repaired/replaced. Aortic valve  regurgitation is not visualized. Mild aortic valve stenosis. Echo findings  are consistent with normal structure and function of the aortic valve  prosthesis. Aortic valve mean gradient measures  12.0 mmHg. Aortic valve Vmax measures 2.29 m/s.  8. The inferior vena cava is normal in size with greater than 50%  respiratory variability, suggesting right atrial pressure of 3 mmHg.   - s/p TAVR   Neuro/Psych Anxiety negative neurological ROS     GI/Hepatic negative GI ROS, Neg liver ROS,   Endo/Other  negative endocrine ROS  Renal/GU negative Renal ROS     Musculoskeletal negative musculoskeletal ROS (+)   Abdominal Normal abdominal exam  (+)   Peds   Hematology negative hematology ROS (+)   Anesthesia Other Findings   Reproductive/Obstetrics                           Anesthesia Physical Anesthesia Plan  ASA: 3  Anesthesia Plan: General   Post-op Pain Management:    Induction: Intravenous  PONV Risk Score and Plan: 3 and Ondansetron, Dexamethasone and Treatment may vary due to age or medical condition  Airway Management Planned: Oral ETT  Additional Equipment: None  Intra-op Plan:   Post-operative Plan: Extubation in OR  Informed Consent: I have reviewed the patients History and Physical, chart, labs and discussed the procedure including the risks, benefits and alternatives for the proposed anesthesia with the patient or authorized representative who has indicated his/her understanding and acceptance.     Dental advisory given  Plan Discussed with: CRNA  Anesthesia Plan Comments: (PAT note written 08/15/2021 by Myra Gianotti, PA-C. )      Anesthesia Quick Evaluation

## 2021-08-15 NOTE — Progress Notes (Signed)
Anesthesia Chart Review:  Case: [id number] Date/Time: 08/20/21 1215   Procedure: XI ROBOTIC ASSISTED BILATERAL INGUINAL HERNIA REPAIR WITH MESH (Bilateral)   Anesthesia type: General   Pre-op diagnosis: RECURRENT BILATERAL INGUINAL HERNIA   Location: [facility] OR ROOM 10 / [facility]   Surgeons: Ralene Ok, MD       DISCUSSION: Patient is a 76 year old male scheduled for the above procedure.  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  He was last seen by cardiologist Dr. Angelena Form on 07/31/2021.  A. fib overall rate controlled.  Continue Coreg and Eliquis.  AVR working well by 1022 echo.  He is not on aspirin since he is on Eliquis. To use antibiotics for SBE prophylaxis given TAVR.  No chest pain.  LDL 107, he is considering Zeta as he refused statin.  In regards to surgery, he wrote, " Pre-operative cardiovascular examination: He can proceed with his planned surgical procedure (hernia repair). OK to hold Eliquis 3 days before his procedure." He confirmed that he is aware to hold Eliquis for 3 days prior to surgery.   Anesthesia team to evaluate on the day of surgery.    VS: BP 131/83    Pulse 82    Temp 36.5 C (Oral)    Resp 17    Ht 6' (1.829 m)    Wt 76.1 kg    SpO2 99%    BMI 22.76 kg/m    PROVIDERS: Redmond School, MD is PCP Lauree Chandler, MD is structural heart cardiologist. He was seeing End, Harrell Gave, MD previously, last in 2019.   LABS: Labs reviewed: Acceptable for surgery. PLT 110K, previously 101K 04/03/21 and 145K 03/29/21.  (all labs ordered are listed, but only abnormal results are displayed)  Labs Reviewed  BASIC METABOLIC PANEL - Abnormal; Notable for the following components:      Result Value   Sodium 134 (*)    All other components within normal  limits  CBC - Abnormal; Notable for the following components:   Platelets 110 (*)    All other components within normal limits     IMAGES: CXR 03/29/21: FINDINGS: Heart and mediastinal contours are within normal limits. No focal opacities or effusions. No acute bony abnormality. IMPRESSION: No active cardiopulmonary disease.    EKG: 04/11/21 ([facility]): Afib at 107 bpm. LBBB.   CV: Echo 05/01/21: IMPRESSIONS   1. Left ventricular ejection fraction, by estimation, is 50 to 55%. The  left ventricle has low normal function. The left ventricle has no regional  wall motion abnormalities. Left ventricular diastolic function could not  be evaluated.   2. Right ventricular systolic function is normal. The right ventricular  size is normal.   3. Left atrial size was severely dilated.   4. Right atrial size was severely dilated.   5. The mitral valve is normal in structure. Trivial mitral valve  regurgitation. No evidence of mitral stenosis.   6. Tricuspid valve regurgitation is mild to moderate.   7. The aortic valve has been repaired/replaced. Aortic valve  regurgitation is not visualized. Mild aortic valve stenosis. Echo findings  are consistent with normal structure and function of the aortic valve  prosthesis. Aortic valve mean gradient measures   12.0 mmHg. Aortic valve Vmax measures 2.29 m/s.   8. The inferior vena cava is normal in size with greater than 50%  respiratory  variability, suggesting right atrial pressure of 3 mmHg.    Long term event monitor 04/03/21-04/11/21: Patch Wear Time:  8 days and 5 hours (2022-09-14T11:24:13-0400 to 2022-09-22T17:11:21-0400)   1 run of Ventricular Tachycardia occurred lasting 9 beats with a max rate of 164 bpm (avg 139 bpm).  Persistent Atrial Fibrillation. Heart rate ranging from 40-174 bpm (avg of 90 bpm). Intermittent Bundle Branch Block was present. Isolated PVCs were rare (<1.0%)   No high grade AV block noted   US Carotid  02/19/21: Summary:  Right Carotid: Velocities in the right ICA are consistent with a 1-39%  stenosis.  Left Carotid: Velocities in the left ICA are consistent with a 1-39%  stenosis.  Vertebrals:  Bilateral vertebral arteries demonstrate antegrade flow.  Subclavians: Normal flow hemodynamics were seen in bilateral subclavian arteries.    PRE TAVR RHC/LHC 02/13/21:   Ost 1st Diag to 1st Diag lesion is 20% stenosed.   Dist LAD lesion is 20% stenosed.   Ost Cx to Mid Cx lesion is 20% stenosed.   Mid LAD lesion is 20% stenosed.   Mild non-obstructive CAD Severe aortic stenosis by echo. Cath data: peak to peak gradient 31 mmHg, mean gradient 30.4 mmHg).    Recommendations: Will continue workup for TAVR.    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 Angelena Form & Dr. Cyndia Bent   Severe aortic stenosis     Past Surgical History:  Procedure Laterality Date   APPENDECTOMY     CARDIAC VALVE REPLACEMENT  04/02/2021   COLON RESECTION     COLON SURGERY  07/06/2013   COLOSTOMY REVISION N/A 07/06/2013   Procedure: COLON RESECTION SIGMOID;  Surgeon: Edward Jolly, MD;  Location: [facility];  Service: General;  Laterality: N/A;   FISTULOTOMY N/A 07/06/2013   Procedure: repair of colovesicle fistula/ FISTULOTOMY;  Surgeon: Edward Jolly, MD;  Location: [facility];  Service: General;  Laterality: N/A;   GUM [place] Left 10/10/2015   Procedure: LEFT INGUINAL HERNIA REPAIR ;  Surgeon: Donnie Mesa, MD;  Location: [facility];  Service:  General;  Laterality: Left;   INSERTION OF MESH Left 10/10/2015   Procedure: INSERTION OF MESH;  Surgeon: Donnie Mesa, MD;  Location: [facility];  Service: General;  Laterality: Left;   RIGHT/LEFT HEART CATH AND CORONARY ANGIOGRAPHY N/A 10/09/2016   Procedure: Right/Left Heart Cath and Coronary Angiography;  Surgeon: Nelva Bush, MD;  Location: [facility];  Service: Cardiovascular;  Laterality: N/A;   RIGHT/LEFT HEART CATH AND CORONARY ANGIOGRAPHY N/A 02/13/2021   Procedure: RIGHT/LEFT HEART CATH AND CORONARY ANGIOGRAPHY;  Surgeon: Burnell Blanks, MD;  Location: [facility];  Service: Cardiovascular;  Laterality: N/A;   TEE WITHOUT CARDIOVERSION N/A 04/02/2021   Procedure: TRANSESOPHAGEAL ECHOCARDIOGRAM (TEE);  Surgeon: Burnell Blanks, MD;  Location: [facility];  Service: Open Heart Surgery;  Laterality: N/A;   TRANSCATHETER AORTIC VALVE REPLACEMENT, TRANSFEMORAL N/A 04/02/2021  Procedure: TRANSCATHETER AORTIC VALVE REPLACEMENT, TRANSFEMORAL;  Surgeon: Burnell Blanks, MD;  Location: [facility];  Service: Open Heart Surgery;  Laterality: N/A;    MEDICATIONS:  acetaminophen (TYLENOL) 500 MG tablet   amoxicillin (AMOXIL) 500 MG tablet   apixaban (ELIQUIS) 5 MG TABS tablet   Bioflavonoid Products (ESTER-C) 500-550 MG TABS   carvedilol (COREG) 3.125 MG tablet   cholecalciferol (VITAMIN D) 25 MCG (1000 UNIT) tablet   fexofenadine (ALLEGRA) 180 MG tablet   losartan (COZAAR) 25 MG tablet   Multiple Vitamins-Minerals (PRESERVISION AREDS 2 PO)   Omega-3 Fatty Acids (FISH OIL) 1000 MG CAPS   Polyvinyl Alcohol-Povidone PF (REFRESH) 1.4-0.6 % SOLN   tadalafil (CIALIS) 5 MG tablet   vitamin C (ASCORBIC ACID) 500 MG tablet   No current facility-administered medications for this encounter.    Myra Gianotti, PA-C Surgical Short Stay/Anesthesiology [facility] Phone [phone] [facility] Phone [phone] 08/15/2021 5:12 PM

## 2021-08-20 ENCOUNTER — Ambulatory Visit (HOSPITAL_COMMUNITY): Payer: Medicare Other | Admitting: Vascular Surgery

## 2021-08-20 ENCOUNTER — Ambulatory Visit (HOSPITAL_COMMUNITY)
Admission: RE | Admit: 2021-08-20 | Discharge: 2021-08-20 | Disposition: A | Discharge status: Home / Self Care | Payer: Medicare Other | Source: Ambulatory Visit | Attending: General Surgery | Admitting: General Surgery

## 2021-08-20 ENCOUNTER — Encounter (HOSPITAL_COMMUNITY)
Admission: RE | Disposition: A | Discharge status: Home / Self Care | Payer: Self-pay | Source: Ambulatory Visit | Attending: General Surgery

## 2021-08-20 ENCOUNTER — Ambulatory Visit (HOSPITAL_COMMUNITY): Payer: Medicare Other | Admitting: Certified Registered Nurse Anesthetist

## 2021-08-20 ENCOUNTER — Other Ambulatory Visit: Payer: Self-pay

## 2021-08-20 ENCOUNTER — Encounter (HOSPITAL_COMMUNITY): Payer: Self-pay | Admitting: General Surgery

## 2021-08-20 DIAGNOSIS — I350 Nonrheumatic aortic (valve) stenosis: Secondary | ICD-10-CM | POA: Diagnosis not present

## 2021-08-20 DIAGNOSIS — Z952 Presence of prosthetic heart valve: Secondary | ICD-10-CM | POA: Insufficient documentation

## 2021-08-20 DIAGNOSIS — F419 Anxiety disorder, unspecified: Secondary | ICD-10-CM | POA: Insufficient documentation

## 2021-08-20 DIAGNOSIS — Z79899 Other long term (current) drug therapy: Secondary | ICD-10-CM | POA: Insufficient documentation

## 2021-08-20 DIAGNOSIS — I071 Rheumatic tricuspid insufficiency: Secondary | ICD-10-CM | POA: Diagnosis not present

## 2021-08-20 DIAGNOSIS — K4021 Bilateral inguinal hernia, without obstruction or gangrene, recurrent: Secondary | ICD-10-CM | POA: Diagnosis not present

## 2021-08-20 DIAGNOSIS — Z87891 Personal history of nicotine dependence: Secondary | ICD-10-CM | POA: Insufficient documentation

## 2021-08-20 DIAGNOSIS — I10 Essential (primary) hypertension: Secondary | ICD-10-CM | POA: Insufficient documentation

## 2021-08-20 HISTORY — PX: INSERTION OF MESH: SHX5868

## 2021-08-20 SURGERY — REPAIR, HERNIA, INGUINAL, BILATERAL, ROBOT-ASSISTED
Anesthesia: General | Site: Inguinal | Laterality: Bilateral

## 2021-08-20 MED ORDER — TRAMADOL HCL 50 MG PO TABS: 50.0000 mg | ORAL_TABLET | Freq: Four times a day (QID) | ORAL | 0 refills | Status: AC | PRN

## 2021-08-20 MED ORDER — ONDANSETRON HCL 4 MG/2ML IJ SOLN
INTRAMUSCULAR | Status: AC
  Filled 2021-08-20: qty 2

## 2021-08-20 MED ORDER — OXYCODONE HCL 5 MG PO TABS: 5.0000 mg | ORAL_TABLET | Freq: Once | ORAL | Status: DC | PRN

## 2021-08-20 MED ORDER — ACETAMINOPHEN 160 MG/5ML PO SOLN: 325.0000 mg | ORAL | Status: DC | PRN

## 2021-08-20 MED ORDER — LIDOCAINE 2% (20 MG/ML) 5 ML SYRINGE
INTRAMUSCULAR | Status: AC
  Filled 2021-08-20: qty 5

## 2021-08-20 MED ORDER — ORAL CARE MOUTH RINSE: 15.0000 mL | Freq: Once | OROMUCOSAL | Status: AC

## 2021-08-20 MED ORDER — CEFAZOLIN SODIUM-DEXTROSE 2-4 GM/100ML-% IV SOLN
2.0000 g | INTRAVENOUS | Status: AC
  Administered 2021-08-20: 2 g via INTRAVENOUS
  Filled 2021-08-20: qty 100

## 2021-08-20 MED ORDER — DEXAMETHASONE SODIUM PHOSPHATE 10 MG/ML IJ SOLN
INTRAMUSCULAR | Status: DC | PRN
  Administered 2021-08-20: 5 mg via INTRAVENOUS

## 2021-08-20 MED ORDER — EPHEDRINE SULFATE-NACL 50-0.9 MG/10ML-% IV SOSY
PREFILLED_SYRINGE | INTRAVENOUS | Status: DC | PRN
  Administered 2021-08-20: 5 mg via INTRAVENOUS

## 2021-08-20 MED ORDER — CHLORHEXIDINE GLUCONATE 0.12 % MT SOLN
15.0000 mL | Freq: Once | OROMUCOSAL | Status: AC
  Administered 2021-08-20: 15 mL via OROMUCOSAL
  Filled 2021-08-20: qty 15

## 2021-08-20 MED ORDER — DEXAMETHASONE SODIUM PHOSPHATE 10 MG/ML IJ SOLN
INTRAMUSCULAR | Status: AC
  Filled 2021-08-20: qty 1

## 2021-08-20 MED ORDER — LIDOCAINE 2% (20 MG/ML) 5 ML SYRINGE
INTRAMUSCULAR | Status: DC | PRN
  Administered 2021-08-20: 100 mg via INTRAVENOUS

## 2021-08-20 MED ORDER — PROPOFOL 10 MG/ML IV BOLUS
INTRAVENOUS | Status: DC | PRN
  Administered 2021-08-20: 120 mg via INTRAVENOUS

## 2021-08-20 MED ORDER — FENTANYL CITRATE (PF) 250 MCG/5ML IJ SOLN
INTRAMUSCULAR | Status: AC
  Filled 2021-08-20: qty 5

## 2021-08-20 MED ORDER — BUPIVACAINE HCL 0.25 % IJ SOLN
INTRAMUSCULAR | Status: DC | PRN
  Administered 2021-08-20: 7 mL

## 2021-08-20 MED ORDER — AMISULPRIDE (ANTIEMETIC) 5 MG/2ML IV SOLN
10.0000 mg | Freq: Once | INTRAVENOUS | Status: AC | PRN
  Administered 2021-08-20: 10 mg via INTRAVENOUS

## 2021-08-20 MED ORDER — ONDANSETRON HCL 4 MG/2ML IJ SOLN
INTRAMUSCULAR | Status: DC | PRN
  Administered 2021-08-20: 4 mg via INTRAVENOUS

## 2021-08-20 MED ORDER — SODIUM CHLORIDE 0.9 % IV SOLN
INTRAVENOUS | Status: DC | PRN
  Administered 2021-08-20: 40 mL

## 2021-08-20 MED ORDER — OXYCODONE HCL 5 MG/5ML PO SOLN: 5.0000 mg | Freq: Once | ORAL | Status: DC | PRN

## 2021-08-20 MED ORDER — 0.9 % SODIUM CHLORIDE (POUR BTL) OPTIME
TOPICAL | Status: DC | PRN
  Administered 2021-08-20: 1000 mL

## 2021-08-20 MED ORDER — CHLORHEXIDINE GLUCONATE CLOTH 2 % EX PADS: 6.0000 | MEDICATED_PAD | Freq: Once | CUTANEOUS | Status: DC

## 2021-08-20 MED ORDER — AMISULPRIDE (ANTIEMETIC) 5 MG/2ML IV SOLN
INTRAVENOUS | Status: AC
  Filled 2021-08-20: qty 2

## 2021-08-20 MED ORDER — BUPIVACAINE LIPOSOME 1.3 % IJ SUSP
INTRAMUSCULAR | Status: AC
  Filled 2021-08-20: qty 20

## 2021-08-20 MED ORDER — BUPIVACAINE HCL (PF) 0.25 % IJ SOLN
INTRAMUSCULAR | Status: AC
  Filled 2021-08-20: qty 30

## 2021-08-20 MED ORDER — PHENYLEPHRINE 40 MCG/ML (10ML) SYRINGE FOR IV PUSH (FOR BLOOD PRESSURE SUPPORT)
PREFILLED_SYRINGE | INTRAVENOUS | Status: AC
  Filled 2021-08-20: qty 10

## 2021-08-20 MED ORDER — ACETAMINOPHEN 325 MG PO TABS: 325.0000 mg | ORAL_TABLET | ORAL | Status: DC | PRN

## 2021-08-20 MED ORDER — FENTANYL CITRATE (PF) 100 MCG/2ML IJ SOLN: 25.0000 ug | INTRAMUSCULAR | Status: DC | PRN

## 2021-08-20 MED ORDER — ACETAMINOPHEN 10 MG/ML IV SOLN: 1000.0000 mg | Freq: Once | INTRAVENOUS | Status: DC | PRN

## 2021-08-20 MED ORDER — PROMETHAZINE HCL 25 MG/ML IJ SOLN: 6.2500 mg | INTRAMUSCULAR | Status: DC | PRN

## 2021-08-20 MED ORDER — LACTATED RINGERS IV SOLN: INTRAVENOUS | Status: DC

## 2021-08-20 MED ORDER — ROCURONIUM BROMIDE 10 MG/ML (PF) SYRINGE
PREFILLED_SYRINGE | INTRAVENOUS | Status: DC | PRN
Start: 2021-08-20 — End: 2021-08-20
  Administered 2021-08-20: 10 mg via INTRAVENOUS
  Administered 2021-08-20: 40 mg via INTRAVENOUS

## 2021-08-20 MED ORDER — ROCURONIUM BROMIDE 10 MG/ML (PF) SYRINGE
PREFILLED_SYRINGE | INTRAVENOUS | Status: AC
  Filled 2021-08-20: qty 10

## 2021-08-20 MED ORDER — ACETAMINOPHEN 500 MG PO TABS
1000.0000 mg | ORAL_TABLET | ORAL | Status: AC
  Administered 2021-08-20: 1000 mg via ORAL
  Filled 2021-08-20: qty 2

## 2021-08-20 MED ORDER — FENTANYL CITRATE (PF) 250 MCG/5ML IJ SOLN
INTRAMUSCULAR | Status: DC | PRN
Start: 2021-08-20 — End: 2021-08-20
  Administered 2021-08-20: 25 ug via INTRAVENOUS
  Administered 2021-08-20: 50 ug via INTRAVENOUS
  Administered 2021-08-20: 100 ug via INTRAVENOUS

## 2021-08-20 MED ORDER — PHENYLEPHRINE 40 MCG/ML (10ML) SYRINGE FOR IV PUSH (FOR BLOOD PRESSURE SUPPORT)
PREFILLED_SYRINGE | INTRAVENOUS | Status: DC | PRN
Start: 2021-08-20 — End: 2021-08-20
  Administered 2021-08-20: 80 ug via INTRAVENOUS
  Administered 2021-08-20 (×3): 40 ug via INTRAVENOUS
  Administered 2021-08-20 (×3): 80 ug via INTRAVENOUS

## 2021-08-20 MED ORDER — ENSURE PRE-SURGERY PO LIQD: 296.0000 mL | Freq: Once | ORAL | Status: DC

## 2021-08-20 MED ORDER — PHENYLEPHRINE HCL-NACL 20-0.9 MG/250ML-% IV SOLN
INTRAVENOUS | Status: DC | PRN
Start: 2021-08-20 — End: 2021-08-20
  Administered 2021-08-20: 30 ug/min via INTRAVENOUS

## 2021-08-20 MED ORDER — SUGAMMADEX SODIUM 200 MG/2ML IV SOLN
INTRAVENOUS | Status: DC | PRN
Start: 2021-08-20 — End: 2021-08-20
  Administered 2021-08-20: 150 mg via INTRAVENOUS

## 2021-08-20 SURGICAL SUPPLY — 60 items
ADH SKN CLS APL DERMABOND .7 (GAUZE/BANDAGES/DRESSINGS) ×1
APL PRP STRL LF DISP 70% ISPRP (MISCELLANEOUS) ×1
BAG COUNTER SPONGE SURGICOUNT (BAG) IMPLANT
BAG SPNG CNTER NS LX DISP (BAG)
CHLORAPREP W/TINT 26 (MISCELLANEOUS) ×3 IMPLANT
COVER MAYO STAND STRL (DRAPES) ×3 IMPLANT
COVER SURGICAL LIGHT HANDLE (MISCELLANEOUS) ×3 IMPLANT
COVER TIP SHEARS 8 DVNC (MISCELLANEOUS) ×2 IMPLANT
COVER TIP SHEARS 8MM DA VINCI (MISCELLANEOUS) ×2
DECANTER SPIKE VIAL GLASS SM (MISCELLANEOUS) ×3 IMPLANT
DEFOGGER SCOPE WARMER CLEARIFY (MISCELLANEOUS) ×2 IMPLANT
DERMABOND ADVANCED (GAUZE/BANDAGES/DRESSINGS) ×1
DERMABOND ADVANCED .7 DNX12 (GAUZE/BANDAGES/DRESSINGS) ×2 IMPLANT
DEVICE TROCAR PUNCTURE CLOSURE (ENDOMECHANICALS) ×3 IMPLANT
DRAPE ARM DVNC X/XI (DISPOSABLE) ×8 IMPLANT
DRAPE COLUMN DVNC XI (DISPOSABLE) ×2 IMPLANT
DRAPE CV SPLIT W-CLR ANES SCRN (DRAPES) ×3 IMPLANT
DRAPE DA VINCI XI ARM (DISPOSABLE) ×8
DRAPE DA VINCI XI COLUMN (DISPOSABLE) ×2
DRAPE ORTHO SPLIT 77X108 STRL (DRAPES) ×2
DRAPE SURG ORHT 6 SPLT 77X108 (DRAPES) ×2 IMPLANT
ELECT REM PT RETURN 9FT ADLT (ELECTROSURGICAL) ×2
ELECTRODE REM PT RTRN 9FT ADLT (ELECTROSURGICAL) ×2 IMPLANT
GLOVE SURG SYN 7.5  E (GLOVE) ×6
GLOVE SURG SYN 7.5 E (GLOVE) ×3 IMPLANT
GLOVE SURG SYN 7.5 PF PI (GLOVE) ×6 IMPLANT
GOWN STRL REUS W/ TWL LRG LVL3 (GOWN DISPOSABLE) ×4 IMPLANT
GOWN STRL REUS W/ TWL XL LVL3 (GOWN DISPOSABLE) ×4 IMPLANT
GOWN STRL REUS W/TWL 2XL LVL3 (GOWN DISPOSABLE) ×3 IMPLANT
GOWN STRL REUS W/TWL LRG LVL3 (GOWN DISPOSABLE) ×4
GOWN STRL REUS W/TWL XL LVL3 (GOWN DISPOSABLE) ×4
KIT BASIN OR (CUSTOM PROCEDURE TRAY) ×3 IMPLANT
KIT TURNOVER KIT B (KITS) ×3 IMPLANT
MARKER SKIN DUAL TIP RULER LAB (MISCELLANEOUS) ×3 IMPLANT
MESH PROGRIP LAP SELF FIXATING (Mesh General) ×4 IMPLANT
MESH PROGRIP LAP SLF FIX 16X12 (Mesh General) ×2 IMPLANT
NDL INSUFFLATION 14GA 120MM (NEEDLE) ×2 IMPLANT
NEEDLE HYPO 22GX1.5 SAFETY (NEEDLE) ×3 IMPLANT
NEEDLE INSUFFLATION 14GA 120MM (NEEDLE) ×2 IMPLANT
OBTURATOR OPTICAL STANDARD 8MM (TROCAR)
OBTURATOR OPTICAL STND 8 DVNC (TROCAR)
OBTURATOR OPTICALSTD 8 DVNC (TROCAR) IMPLANT
PAD ARMBOARD 7.5X6 YLW CONV (MISCELLANEOUS) ×6 IMPLANT
SEAL CANN UNIV 5-8 DVNC XI (MISCELLANEOUS) ×4 IMPLANT
SEAL XI 5MM-8MM UNIVERSAL (MISCELLANEOUS) ×4
SET IRRIG TUBING LAPAROSCOPIC (IRRIGATION / IRRIGATOR) IMPLANT
SET TUBE SMOKE EVAC HIGH FLOW (TUBING) ×3 IMPLANT
STOPCOCK 4 WAY LG BORE MALE ST (IV SETS) ×3 IMPLANT
SUT MNCRL AB 4-0 PS2 18 (SUTURE) ×3 IMPLANT
SUT VIC AB 2-0 SH 27 (SUTURE) ×2
SUT VIC AB 2-0 SH 27X BRD (SUTURE) IMPLANT
SUT VIC AB 2-0 SH 27XBRD (SUTURE) IMPLANT
SUT VLOC 180 2-0 6IN GS21 (SUTURE) ×3 IMPLANT
SUT VLOC 180 2-0 9IN GS21 (SUTURE) ×3 IMPLANT
SYR 30ML SLIP (SYRINGE) ×3 IMPLANT
SYR TOOMEY 50ML (SYRINGE) ×3 IMPLANT
TOWEL GREEN STERILE FF (TOWEL DISPOSABLE) ×3 IMPLANT
TRAY FOLEY MTR SLVR 14FR STAT (SET/KITS/TRAYS/PACK) IMPLANT
TRAY FOLEY MTR SLVR 16FR STAT (SET/KITS/TRAYS/PACK) IMPLANT
TRAY LAPAROSCOPIC MC (CUSTOM PROCEDURE TRAY) ×3 IMPLANT

## 2021-08-20 NOTE — Anesthesia Postprocedure Evaluation (Signed)
Anesthesia Post Note  Patient: Jalyn Rosero Hardt  Procedure(s) Performed: XI ROBOTIC ASSISTED BILATERAL INGUINAL HERNIA REPAIR WITH MESH (Bilateral: Inguinal) INSERTION OF MESH (Bilateral: Inguinal)     Patient location during evaluation: PACU Anesthesia Type: General Level of consciousness: awake and alert Pain management: pain level controlled Vital Signs Assessment: post-procedure vital signs reviewed and stable Respiratory status: spontaneous breathing, nonlabored ventilation, respiratory function stable and patient connected to nasal cannula oxygen Cardiovascular status: blood pressure returned to baseline and stable Postop Assessment: no apparent nausea or vomiting Anesthetic complications: no   No notable events documented.  Last Vitals:  Vitals:   08/20/21 1425 08/20/21 1440  BP: 129/79 119/73  Pulse: 74 73  Resp: 16 20  Temp:    SpO2: 100% 100%    Last Pain:  Vitals:   08/20/21 1440  TempSrc:   PainSc: 0-No pain                 Effie Berkshire

## 2021-08-20 NOTE — Discharge Instructions (Signed)
[facility] _______[facility] ? ?INGUINAL HERNIA REPAIR: POST OP INSTRUCTIONS ? ?Always review your discharge instruction sheet given to you by the facility where your surgery was performed. ?IF YOU HAVE DISABILITY OR FAMILY LEAVE FORMS, YOU MUST BRING THEM TO THE OFFICE FOR PROCESSING.   ?DO NOT GIVE THEM TO YOUR DOCTOR. ? ?1. A  prescription for pain medication may be given to you upon discharge.  Take your pain medication as prescribed, if needed.  If narcotic pain medicine is not needed, then you may take acetaminophen (Tylenol) or ibuprofen (Advil) as needed. ?2. Take your usually prescribed medications unless otherwise directed. ?If you need a refill on your pain medication, please contact your pharmacy.  They will contact our office to request authorization. Prescriptions will not be filled after 5 pm or on week-ends. ?3. You should follow a light diet the first 24 hours after arrival home, such as soup and crackers, etc.  Be sure to include lots of fluids daily.  Resume your normal diet the day after surgery. ?4.Most patients will experience some swelling and bruising around the umbilicus or in the groin and scrotum.  Ice packs and reclining will help.  Swelling and bruising can take several days to resolve.  ?6. It is common to experience some constipation if taking pain medication after surgery.  Increasing fluid intake and taking a stool softener (such as Colace) will usually help or prevent this problem from occurring.  A mild laxative (Milk of Magnesia or Miralax) should be taken according to package directions if there are no bowel movements after 48 hours. ?7. Unless discharge instructions indicate otherwise, you may remove your bandages 24-48 hours after surgery, and you may shower at that time.  You may have steri-strips (small skin tapes) in place directly over the incision.  These strips should be left on the skin for 7-10 days.  If your surgeon used skin glue on the incision, you may  shower in 24 hours.  The glue will flake off over the next 2-3 weeks.  Any sutures or staples will be removed at the office during your follow-up visit. ?8. ACTIVITIES:  You may resume regular (light) daily activities beginning the next day--such as daily self-care, walking, climbing stairs--gradually increasing activities as tolerated.  You may have sexual intercourse when it is comfortable.  Refrain from any heavy lifting or straining until approved by your doctor. ? ?a.You may drive when you are no longer taking prescription pain medication, you can comfortably wear a seatbelt, and you can safely maneuver your car and apply brakes. ?b.RETURN TO WORK:   ?_____________________________________________ ? ?9.You should see your doctor in the office for a follow-up appointment approximately 2-3 weeks after your surgery.  Make sure that you call for this appointment within a day or two after you arrive home to insure a convenient appointment time. ?10.OTHER INSTRUCTIONS: _________________________ ?   _____________________________________ ? ?WHEN TO CALL YOUR DOCTOR: ?Fever over 101.0 ?Inability to urinate ?Nausea and/or vomiting ?Extreme swelling or bruising ?Continued bleeding from incision. ?Increased pain, redness, or drainage from the incision ? ?The clinic staff is available to answer your questions during regular business hours.  Please don?t hesitate to call and ask to speak to one of the nurses for clinical concerns.  If you have a medical emergency, go to the nearest emergency room or call 911.  A surgeon from [facility] is always on call at the hospital ? ? ?[address], [address], [place],     [address] ? ? [address] [address], [place], [place]   [address] ?[phone] ? [phone] ? FAX [phone] ?Web site: [email or web address] ? ?

## 2021-08-20 NOTE — Interval H&P Note (Signed)
History and Physical Interval Note:  08/20/2021 12:11 PM  Nicholas Sutton  has presented today for surgery, with the diagnosis of RECURRENT BILATERAL INGUINAL HERNIA.  The various methods of treatment have been discussed with the patient and family. After consideration of risks, benefits and other options for treatment, the patient has consented to  Procedure(s): XI ROBOTIC [place] (Bilateral) as a surgical intervention.  The patient's history has been reviewed, patient examined, no change in status, stable for surgery.  I have reviewed the patient's chart and labs.  Questions were answered to the patient's satisfaction.     Ralene Ok

## 2021-08-20 NOTE — Op Note (Signed)
08/20/2021  1:54 PM  PATIENT:  Nicholas Sutton  76 y.o. male  PRE-OPERATIVE DIAGNOSIS:  RECURRENT BILATERAL INGUINAL HERNIA  POST-OPERATIVE DIAGNOSIS:  RECURRENT BILATERAL INGUINAL HERNIA  PROCEDURE:  Procedure(s): XI ROBOTIC ASSISTED BILATERAL INGUINAL HERNIA REPAIR WITH MESH (Bilateral) TAPP INSERTION OF MESH (Bilateral)  SURGEON:  Surgeon(s) and Role:    Ralene Ok, MD - Primary  ASSISTANTS: Pryor Curia, RNFA   ANESTHESIA:   local and general  EBL:  minimal   BLOOD ADMINISTERED:none  DRAINS: none   LOCAL MEDICATIONS USED:  BUPIVICAINE   SPECIMEN:  No Specimen  DISPOSITION OF SPECIMEN:  N/A  COUNTS:  YES  TOURNIQUET:  * No tourniquets in log *  DICTATION: .Dragon Dictation  The patient was taken back to the operating room. The patient was placed in supine position with bilateral SCDs in place. The patient was prepped and draped in the usual sterile fashion.  After appropriate anitbiotics were confirmed, a time-out was confirmed and all facts were verified.  At this time a Veress needle technique was used to inspect the abdomen approximately 10 cm from the umbilicus in the  Midclavicular line. This time a 12 mm robotic trocar was placed into the abdomen. The camera was placed there was no injury to any intra-abdominal organs. A 33mm umbilical port was placed just superior to the umbilicus. An 8 mm port was placed approximately 10 cm lateral to the umbilicus on the left paramedian side. There was some adhesion in the lower midline that were taken down sharply.  Robot was positioned over the patient and the ports were docked in the usual fashion.  At this time the left-sided peritoneum was taken down from the medial umbilical ligament laterally. The pre-peritoneal space was entered. Dissection was taken down to Cooper's ligament. At this time it was apparent there was a small direct hernia.  At this time I proceeded clean out the rest Cooper's ligament and the  medial to lateral direction.  The direct hernia was dissected away from the transversalis fascia. I proceeded laterally to dissect the spermatic cord. The spermatic cord was circumferentially dissected away from the surrounding musculature and tissue. There was a small to moderate sized indirect hernia.  The hernia sac was dissected back.  The vas deferens was identified and protected all portions of the case. There also appeared to be a femoral hernia with some incarcerated fat.  This was also dissected out.    At this time I proceeded to create a pocket laterally for the mesh. Once the pocket was created the peritoneum was stripped back to approximately the base of the cord. At this time the piece of 16x12cm Progrip mesh was placed into the dissected area. This covered both the direct and indirect and femoral spaces appropriately.   The mesh lay flat from medial to lateral.    At this time I turned my attention to the right side.  The peritoneum was taken down from the medial umbilical ligament laterally.  There was not much peritoneum medially as the patient had a previous lower midline incision from his previous operation.  Dissection was carried from a lateral to medial direction.  The lateral, transversalis muscle was adherent to the peritoneum.  This was transected laterally.  It appeared that the ilioinguinal nerve was encountered.  This was also transected as this was in the plane of dissection and scar tissue.  This was cauterized and transected.  The preperitoneal space was dissected down to Cooper's ligament.  At this time it was apparent there was a moderate sized indirect hernia.  At this time proceeded clean out the rest Cooper's ligament and the medial to lateral direction. A proceeded laterally to dissect the spermatic cord. The spermatic cord was circumferentially dissected away from the surrounding musculature and tissue. The vas deferens was identified and protected all portions of the  case. There was a small indirect hernia at the base of the spermatic cord. This was dissected back.  At this time I proceeded to create a pocket laterally for the mesh. Once the pocket was created the peritoneum was stripped back to approximately the base of the cord. At this time the piece of 16 x 12 ProGrip mesh was in placed into the dissected area. This covered both the direct and indirect spaces appropriately. This also covered  The femoral space. The mesh lay flat from medial to lateral.  At this time 0 V-Loc's were then used to reapproximate the peritoneum.  This was done in a canal fashion.  There was a small tear in the left side of the peritoneum.  This was ligated using a running 2-0 Vicryl.  At this time the robot was undocked. The umbilical port site was reapproximated using a 0 Vicryl via an Endo Close device 1. All ports were removed. The skin was reapproximated and all port sites using 4-0 Monocryl subcuticular fashion.  The patient the procedure well was taken to the recovery    PLAN OF CARE: Discharge to home after PACU  PATIENT DISPOSITION:  PACU - hemodynamically stable.   Delay start of Pharmacological VTE agent (>24hrs) due to surgical blood loss or risk of bleeding: not applicable

## 2021-08-20 NOTE — Anesthesia Procedure Notes (Signed)
Procedure Name: Intubation Date/Time: 08/20/2021 12:28 PM Performed by: Janene Harvey, CRNA Pre-anesthesia Checklist: Patient identified, Emergency Drugs available, Suction available and Patient being monitored Patient Re-evaluated:Patient Re-evaluated prior to induction Oxygen Delivery Method: Circle system utilized Preoxygenation: Pre-oxygenation with 100% oxygen Induction Type: IV induction Ventilation: Mask ventilation without difficulty Laryngoscope Size: Mac and 3 Grade View: Grade I Tube type: Oral Tube size: 7.5 mm Number of attempts: 1 Airway Equipment and Method: Stylet and Oral airway Placement Confirmation: ETT inserted through vocal cords under direct vision, positive ETCO2 and breath sounds checked- equal and bilateral Secured at: 22 cm Tube secured with: Tape Dental Injury: Teeth and Oropharynx as per pre-operative assessment  Comments: Performed by Fernanda Drum

## 2021-08-20 NOTE — Transfer of Care (Signed)
Immediate Anesthesia Transfer of Care Note  Patient: Nicholas Sutton  Procedure(s) Performed: XI ROBOTIC ASSISTED BILATERAL INGUINAL HERNIA REPAIR WITH MESH (Bilateral: Inguinal) INSERTION OF MESH (Bilateral: Inguinal)  Patient Location: PACU  Anesthesia Type:General  Level of Consciousness: drowsy and patient cooperative  Airway & Oxygen Therapy: Patient Spontanous Breathing and Patient connected to face mask oxygen  Post-op Assessment: Report given to RN and Post -op Vital signs reviewed and stable  Post vital signs: Reviewed and stable  Last Vitals:  Vitals Value Taken Time  BP 140/86 08/20/21 1410  Temp    Pulse 73 08/20/21 1412  Resp 13 08/20/21 1412  SpO2 100 % 08/20/21 1412  Vitals shown include unvalidated device data.  Last Pain:  Vitals:   08/20/21 1132  TempSrc:   PainSc: 0-No pain         Complications: No notable events documented.

## 2021-08-21 ENCOUNTER — Encounter (HOSPITAL_COMMUNITY): Payer: Self-pay | Admitting: General Surgery

## 2021-09-02 ENCOUNTER — Other Ambulatory Visit: Payer: Self-pay | Admitting: *Deleted

## 2021-09-02 DIAGNOSIS — I428 Other cardiomyopathies: Secondary | ICD-10-CM

## 2021-09-02 MED ORDER — LOSARTAN POTASSIUM 25 MG PO TABS: ORAL_TABLET | ORAL | 3 refills | Status: DC

## 2021-09-04 ENCOUNTER — Telehealth: Payer: Self-pay | Admitting: Cardiovascular Disease

## 2021-09-04 NOTE — Telephone Encounter (Signed)
° °  STAT if patient feels like he/she is going to faint   Are you dizzy now? No   Do you feel faint or have you passed out? Not sure   Do you have any other symptoms? Sweaty, vomit  Have you checked your HR and BP (record if available)? HR 84  Been off and on for a week or 2, but the last time it happened is the worst one he had, he said the room was sipping made him so sweaty and he threw up, he doesn't think he felt that he's going to past out, only feeling very very dizzy

## 2021-09-04 NOTE — Telephone Encounter (Signed)
Returned call to patient and placed on schedule tomorrow with Dr Harrington Challenger (DOD).

## 2021-09-04 NOTE — Telephone Encounter (Signed)
Pt called in stating that for the last week to 10 days he has been experiencing intermittent dizziness that occurs at rest and with activity. Pt states that yesterday was his worst episode and condones 'the room was spinning, bad spinning, and I had to catch myself" along with associated sweating and one episode of vomiting. Pt denies SOB or CP. States episodes usually last for a few minutes. Pt has hx of persistent A-fib (takes Eliquis and Metoprolol without missed doses) but denies being able to feel if he goes in and out, only states his has always been asymptomatic. Pt had TAVR on 05/01/21 and hernia repair recently on 1/31. Will route message to [facility] for recommendation.

## 2021-09-05 ENCOUNTER — Ambulatory Visit (INDEPENDENT_AMBULATORY_CARE_PROVIDER_SITE_OTHER): Payer: Medicare Other | Admitting: Internal Medicine

## 2021-09-05 ENCOUNTER — Encounter: Payer: Self-pay | Admitting: Internal Medicine

## 2021-09-05 ENCOUNTER — Other Ambulatory Visit: Payer: Self-pay

## 2021-09-05 VITALS — BP 128/78 | HR 86 | Ht 72.0 in | Wt 156.0 lb

## 2021-09-05 DIAGNOSIS — I2510 Atherosclerotic heart disease of native coronary artery without angina pectoris: Secondary | ICD-10-CM

## 2021-09-05 DIAGNOSIS — R42 Dizziness and giddiness: Secondary | ICD-10-CM | POA: Diagnosis not present

## 2021-09-05 MED ORDER — MECLIZINE HCL 25 MG PO TABS: 25.0000 mg | ORAL_TABLET | Freq: Three times a day (TID) | ORAL | 1 refills | Status: DC | PRN

## 2021-09-05 NOTE — Progress Notes (Signed)
No chief complaint on file.   History of Present Illness:  Pt is a 76 yo male with history of non-ischemic cardiomyopathy, former tobacco abuse, persistent atrial fibrillation, severe aortic stenosis s/p TAVR (23 mm Edwards Sapien 3 THV in Sept 2022)   Cardiac cath July 2022 with mild CAD. Echo 05/01/21 with LVEF=50-55%, normal RV function. Severe dilation both atria. Normally functioning AVR with mean gradient 12 mmHg.   Pt seen byC McALhany in Jan 2023   The pt called in yesterdasy   COmplained of dizziness     Felt room spinning  Tues was severe    Broke out into a ssweat  Severe Nauseated.    Had to hold on to bed.  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 Angelena Form & Dr. Cyndia Bent   Severe aortic stenosis     Past Surgical History:  Procedure Laterality Date   APPENDECTOMY     CARDIAC VALVE REPLACEMENT  04/02/2021   COLON RESECTION     COLON SURGERY  07/06/2013   COLOSTOMY REVISION N/A 07/06/2013   Procedure: COLON RESECTION SIGMOID;  Surgeon: Edward Jolly, MD;  Location: [facility];  Service: General;  Laterality: N/A;   FISTULOTOMY N/A 07/06/2013   Procedure: repair of colovesicle fistula/ FISTULOTOMY;  Surgeon: Edward Jolly, MD;  Location: [facility];  Service: General;  Laterality: N/A;   GUM [place] Left 10/10/2015    Procedure: LEFT INGUINAL HERNIA REPAIR ;  Surgeon: Donnie Mesa, MD;  Location: [place];  Service: General;  Laterality: Left;   INSERTION OF MESH Left 10/10/2015   Procedure: INSERTION OF MESH;  Surgeon: Donnie Mesa, MD;  Location: [place];  Service: General;  Laterality: Left;   INSERTION OF MESH Bilateral 08/20/2021   Procedure: INSERTION OF MESH;  Surgeon: Ralene Ok, MD;  Location: [place];  Service: General;  Laterality: Bilateral;   RIGHT/LEFT HEART CATH AND CORONARY ANGIOGRAPHY N/A 10/09/2016   Procedure: Right/Left Heart Cath and Coronary Angiography;  Surgeon: Nelva Bush, MD;  Location: [place] [facility];  Service: Cardiovascular;  Laterality: N/A;   RIGHT/LEFT HEART CATH AND CORONARY ANGIOGRAPHY N/A 02/13/2021   Procedure: RIGHT/LEFT HEART CATH AND CORONARY ANGIOGRAPHY;  Surgeon: Burnell Blanks, MD;  Location: [place] [facility];  Service: Cardiovascular;  Laterality: N/A;   TEE WITHOUT CARDIOVERSION N/A 04/02/2021   Procedure: TRANSESOPHAGEAL ECHOCARDIOGRAM (TEE);  Surgeon: Burnell Blanks, MD;  Location: [place] [facility];  Service: Open Heart Surgery;  Laterality: N/A;   TRANSCATHETER AORTIC VALVE REPLACEMENT, TRANSFEMORAL N/A 04/02/2021   Procedure: TRANSCATHETER AORTIC VALVE REPLACEMENT, TRANSFEMORAL;  Surgeon: Burnell Blanks, MD;  Location: [place] [facility];  Service: Open Heart Surgery;  Laterality: N/A;    Current  Outpatient Medications  Medication Sig Dispense Refill   acetaminophen (TYLENOL) 500 MG tablet Take 1,000 mg by mouth every 6 (six) hours as needed for headache.     amoxicillin (AMOXIL) 500 MG tablet Take 2,000 mg by mouth as directed. Take 4 tablets by mouth 1 hour prior to dental work     apixaban (ELIQUIS) 5 MG TABS tablet TAKE 1 TABLET(5 MG) BY MOUTH TWICE DAILY 180 tablet 1   Bioflavonoid Products (ESTER-C) 500-550 MG TABS Take 1 tablet by mouth every morning.     carvedilol (COREG) 3.125 MG tablet Take 1 tablet (3.125 mg  total) by mouth 2 (two) times daily. 180 tablet 2   cholecalciferol (VITAMIN D) 25 MCG (1000 UNIT) tablet Take 1,000 Units by mouth in the morning and at bedtime.     fexofenadine (ALLEGRA) 180 MG tablet Take 180 mg by mouth daily as needed for allergies or rhinitis.     losartan (COZAAR) 25 MG tablet TAKE 1 TABLET(25 MG) BY MOUTH DAILY 90 tablet 3   Multiple Vitamins-Minerals (PRESERVISION AREDS 2 PO) Take 1 tablet by mouth in the morning and at bedtime.     Omega-3 Fatty Acids (FISH OIL) 1000 MG CAPS Take 1,000 mg by mouth in the morning and at bedtime.     Polyvinyl Alcohol-Povidone PF (REFRESH) 1.4-0.6 % SOLN Place 1 drop into both eyes daily as needed (dry eyes).     tadalafil (CIALIS) 5 MG tablet Take 5 mg by mouth daily as needed for erectile dysfunction.     traMADol (ULTRAM) 50 MG tablet Take 1 tablet (50 mg total) by mouth every 6 (six) hours as needed. 20 tablet 0   vitamin C (ASCORBIC ACID) 500 MG tablet Take 500 mg by mouth every evening.     No current facility-administered medications for this visit.    Allergies  Allergen Reactions   Dust Mite Extract Other (See Comments)    Sneezing   Pollen Extract Other (See Comments)    Sneezing    Social History   Socioeconomic History   Marital status: Significant Other    Spouse name: Not on file   Number of children: Not on file   Years of education: Not on file   Highest education level: Not on file  Occupational History   Not on file  Tobacco Use   Smoking status: Former    Packs/day: 1.50    Years: 30.00    Pack years: 45.00    Types: Cigarettes    Quit date: 06/03/2008    Years since quitting: 13.2   Smokeless tobacco: Never  Vaping Use   Vaping Use: Never used  Substance and Sexual Activity   Alcohol use: Not Currently    Comment: rare   Drug use: No   Sexual activity: Yes    Birth control/protection: None  Other Topics Concern   Not on file  Social History Narrative   Not on file   Social  Determinants of Health   Financial Resource Strain: Not on file  Food Insecurity: Not on file  Transportation Needs: Not on file  Physical Activity: Not on file  Stress: Not on file  Social Connections: Not on file  Intimate Partner Violence: Not on file    Family History  Problem Relation Age of Onset   Stroke Mother 7   Heart attack Father 45   Hypertension Unknown     Review of Systems:  As stated in the HPI and otherwise negative.   BP  128/78    Pulse 86    Ht 6' (1.829 m)    Wt 156 lb (70.8 kg)    SpO2 97%    BMI 21.16 kg/m   Physical Examination:  General: Well developed, well nourished, 76 yo in NAD  HEENT: OP clear, mucus membranes moist  SKIN: warm, dry. No rashes. Neuro:CN II-XII intact   Motor 5/5 throughout   Rapid alternating movement intact   Heel / toe OK  Rhomberg negative  Musculoskeletal: Muscle strength 5/5 all ext  Psychiatric: Mood and affect normal  Neck: No JVD, no carotid bruits, no thyromegaly, no lymphadenopathy.  Lungs:Clear bilaterally, no wheezes or rales   Cardiovascular: Regular rate and rhythm.  No murmurs, gallops or rubs. Abdomen:Soft. Bowel sounds present. Non-tender.  Extremities: No lower extremity edema. Pulses are 2 + in the bilateral DP/PT.  .EKG:  EKG is not ordered today.  Echo 05/01/21:   1. Left ventricular ejection fraction, by estimation, is 50 to 55%. The  left ventricle has low normal function. The left ventricle has no regional  wall motion abnormalities. Left ventricular diastolic function could not  be evaluated.   2. Right ventricular systolic function is normal. The right ventricular  size is normal.   3. Left atrial size was severely dilated.   4. Right atrial size was severely dilated.   5. The mitral valve is normal in structure. Trivial mitral valve  regurgitation. No evidence of mitral stenosis.   6. Tricuspid valve regurgitation is mild to moderate.   7. The aortic valve has been repaired/replaced. Aortic  valve  regurgitation is not visualized. Mild aortic valve stenosis. Echo findings  are consistent with normal structure and function of the aortic valve  prosthesis. Aortic valve mean gradient measures   12.0 mmHg. Aortic valve Vmax measures 2.29 m/s.   8. The inferior vena cava is normal in size with greater than 50%  respiratory variability, suggesting right atrial pressure of 3 mmHg.   Recent Labs: 03/29/2021: ALT 24 05/01/2021: TSH 2.420 08/14/2021: BUN 20; Creatinine, Ser 0.95; Hemoglobin 14.5; Platelets 110; Potassium 4.6; Sodium 134   Lipid Panel    Component Value Date/Time   CHOL 202 (H) 08/13/2021 1019   CHOL 160 01/09/2020 0816   TRIG 46 08/13/2021 1019   HDL 41 08/13/2021 1019   HDL 42 01/09/2020 0816   CHOLHDL 4.9 08/13/2021 1019   VLDL 9 08/13/2021 1019   LDLCALC 152 (H) 08/13/2021 1019   LDLCALC 107 (H) 01/09/2020 0816     Wt Readings from Last 3 Encounters:  09/05/21 156 lb (70.8 kg)  08/20/21 165 lb (74.8 kg)  08/14/21 167 lb 12.8 oz (76.1 kg)     Other studies Reviewed: Additional studies/ records that were reviewed today include:  Review of the above records demonstrates:   Assessment and Plan:   1  Dizziness   Episodes sound more like vertigo   Not orthostatic     No recnet URI   Neuro exam is normal     I would recomm trial of meclizine.    I would also refer to vestibular rehab     1. Atrial fibrillation, persistent: Atrial fib with ontrolled rate. Continue Coreg, Eliquis.   2. Non-ischemic cardiomyopathy: LV function normal by echo October 2022.  Volume status is OK    3. Severe Aortic stenosis:  he is s/p TAVR.  Last echo in Oct 2022   No ASA since he is on Eliquis. He will use antibiotics for SBE  prophylaxis.   4. Hyperlipidemia: LDL 107. He refuses statin therapy. He has never failed a statin but read about side effects and has refused. WIll defer to [place]  ? Repatha ore inclisran in future      5. CAD without angina: Mild CAD by cath in  2022. He has no chest pain. Continue beta blocker. He refuses statin therapy.   Current medicines are reviewed at length with the patient today.  The patient does not have concerns regarding medicines.  The following changes have been made:  no change  Labs/ tests ordered today include:   No orders of the defined types were placed in this encounter.    Disposition:   F/U in September in valve clinic.   Signed, Lauree Chandler, MD 09/05/2021 11:42 AM    [facility] [address], [place], [place]  [address] Phone: [phone]; Fax: [phone]

## 2021-09-05 NOTE — Patient Instructions (Signed)
Medication Instructions:  Start Meclizine 25 mg 1-2 tabs as needed every 6 hours as needed for vertigo.  *If you need a refill on your cardiac medications before your next appointment, please call your pharmacy*   Lab Work: none If you have labs (blood work) drawn today and your tests are completely normal, you will receive your results only by: [place] (if you have MyChart) OR A paper copy in the mail If you have any lab test that is abnormal or we need to change your treatment, we will call you to review the results.   Testing/Procedures: none   Follow-Up: At [facility], you and your health needs are our priority.  As part of our continuing mission to provide you with exceptional heart care, we have created designated Provider Care Teams.  These Care Teams include your primary Cardiologist (physician) and Advanced Practice Providers (APPs -  Physician Assistants and Nurse Practitioners) who all work together to provide you with the care you need, when you need it.  We recommend signing up for the patient portal called "MyChart".  Sign up information is provided on this After Visit Summary.  MyChart is used to connect with patients for Virtual Visits (Telemedicine).  Patients are able to view lab/test results, encounter notes, upcoming appointments, etc.  Non-urgent messages can be sent to your provider as well.   To learn more about what you can do with MyChart, go to [email or web address].    Other Instructions Vestibular rehab

## 2021-09-06 ENCOUNTER — Other Ambulatory Visit: Payer: Self-pay

## 2021-09-06 MED ORDER — MECLIZINE HCL 25 MG PO TABS: 25.0000 mg | ORAL_TABLET | Freq: Three times a day (TID) | ORAL | 1 refills | Status: DC | PRN

## 2021-09-11 DIAGNOSIS — H25813 Combined forms of age-related cataract, bilateral: Secondary | ICD-10-CM | POA: Diagnosis not present

## 2021-09-11 DIAGNOSIS — H35363 Drusen (degenerative) of macula, bilateral: Secondary | ICD-10-CM | POA: Diagnosis not present

## 2021-09-11 DIAGNOSIS — H524 Presbyopia: Secondary | ICD-10-CM | POA: Diagnosis not present

## 2021-09-16 ENCOUNTER — Encounter (HOSPITAL_COMMUNITY): Payer: Self-pay | Admitting: Physical Therapy

## 2021-09-16 ENCOUNTER — Ambulatory Visit (HOSPITAL_COMMUNITY): Payer: Medicare Other | Attending: Internal Medicine | Admitting: Physical Therapy

## 2021-09-16 ENCOUNTER — Other Ambulatory Visit: Payer: Self-pay

## 2021-09-16 DIAGNOSIS — R42 Dizziness and giddiness: Secondary | ICD-10-CM | POA: Diagnosis not present

## 2021-09-16 NOTE — Therapy (Signed)
[place], [place], [address] Phone: [phone]   Fax:  [phone]  Physical Therapy Evaluation  Patient Details  Name: Nicholas Sutton MRN: [id number] Date of Birth: August 28, 1945 Referring Provider (PT): Dorris Carnes   Encounter Date: 09/16/2021   PT End of Session - 09/16/21 1130     Visit Number 1    Number of Visits 2    Date for PT Re-Evaluation 09/27/21    Authorization Type medicare/ BCBS    Progress Note Due on Visit 2    PT Start Time 0915    PT Stop Time 1000    PT Time Calculation (min) 45 min    Activity Tolerance Patient tolerated treatment well    Behavior During Therapy [facility] for tasks assessed/performed             Past Medical History:  Diagnosis Date   Anxiety    Atrial fibrillation ([place])    Diarrhea    Diverticulitis    Dysrhythmia    Erectile dysfunction    Fistula, 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 by Dr Excell Seltzer   Heart murmur    Resolved since valve replacement   Left bundle branch block 04/02/2021   Nonischemic cardiomyopathy ([place])    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 Angelena Form & Dr. Cyndia Bent   Severe aortic stenosis     Past Surgical History:  Procedure Laterality Date   APPENDECTOMY     CARDIAC VALVE REPLACEMENT  04/02/2021   COLON RESECTION     COLON SURGERY  07/06/2013   COLOSTOMY REVISION N/A 07/06/2013   Procedure: COLON RESECTION SIGMOID;  Surgeon: Edward Jolly, MD;  Location: [facility];  Service: General;  Laterality: N/A;   FISTULOTOMY N/A 07/06/2013   Procedure: repair of colovesicle fistula/ FISTULOTOMY;  Surgeon: Edward Jolly, MD;  Location: [facility];  Service: General;  Laterality: N/A;   GUM SURGERY     HERNIA REPAIR     INGUINAL HERNIA REPAIR Left 10/10/2015   Procedure: LEFT INGUINAL HERNIA REPAIR ;  Surgeon:  Donnie Mesa, MD;  Location: [place];  Service: General;  Laterality: Left;   INSERTION OF MESH Left 10/10/2015   Procedure: INSERTION OF MESH;  Surgeon: Donnie Mesa, MD;  Location: [place];  Service: General;  Laterality: Left;   INSERTION OF MESH Bilateral 08/20/2021   Procedure: INSERTION OF MESH;  Surgeon: Ralene Ok, MD;  Location: [place];  Service: General;  Laterality: Bilateral;   RIGHT/LEFT HEART CATH AND CORONARY ANGIOGRAPHY N/A 10/09/2016   Procedure: Right/Left Heart Cath and Coronary Angiography;  Surgeon: Nelva Bush, MD;  Location: [facility] [facility];  Service: Cardiovascular;  Laterality: N/A;   RIGHT/LEFT HEART CATH AND CORONARY ANGIOGRAPHY N/A 02/13/2021   Procedure: RIGHT/LEFT HEART CATH AND CORONARY ANGIOGRAPHY;  Surgeon: Burnell Blanks, MD;  Location: [place] [facility];  Service: Cardiovascular;  Laterality: N/A;   TEE WITHOUT CARDIOVERSION N/A 04/02/2021   Procedure: TRANSESOPHAGEAL ECHOCARDIOGRAM (TEE);  Surgeon: Burnell Blanks, MD;  Location: [place] [facility];  Service: Open Heart Surgery;  Laterality: N/A;   TRANSCATHETER AORTIC VALVE REPLACEMENT, TRANSFEMORAL N/A 04/02/2021   Procedure: TRANSCATHETER AORTIC VALVE REPLACEMENT, TRANSFEMORAL;  Surgeon: Burnell Blanks, MD;  Location: [place] [facility];  Service: Open Heart Surgery;  Laterality: N/A;    There were no vitals filed  for this visit.    Subjective Assessment - 09/16/21 0934     Subjective Nicholas Sutton states that on 2/14 he was sitting at his desk when he experienced a severe case of vertigo that lasted for a minute.  This has not occured since.  He went to his cardiologist as he had a valve replacement on 9/13 but was cleared by his cardiologist and was referred to skilled PT>                [facility] PT Assessment - 09/16/21 0001       Assessment   Medical Diagnosis Vertigo    Referring Provider (PT) Dorris Carnes    Onset Date/Surgical Date 09/03/21    Next MD Visit  not scheduled    Prior Therapy none      Precautions   Precautions None      Balance Screen   Has the patient fallen in the past 6 months No    Has the patient had a decrease in activity level because of a fear of falling?  No    Is the patient reluctant to leave their home because of a fear of falling?  No      Prior Function   Level of Independence Independent      Cognition   Overall Cognitive Status Within Functional Limits for tasks assessed      Functional Tests   Functional tests Single leg stance      Single Leg Stance   Comments Lt 30" therapist stopped; Rt: 30: therapist stopped      Posture/Postural Control   Posture/Postural Control Postural limitations    Postural Limitations Rounded Shoulders;Forward head;Increased thoracic kyphosis                    Vestibular Assessment - 09/16/21 0001       Symptom Behavior   Subjective history of current problem see above    Type of Dizziness  Spinning    Frequency of Dizziness less than once a week    Duration of Dizziness less than a minute    Symptom Nature Spontaneous    Aggravating Factors No known aggravating factors    Relieving Factors Rest    Progression of Symptoms Better    History of similar episodes yes;      Oculomotor Exam   Smooth Pursuits Saccades   B greater to the left   Saccades Intact      Oculomotor Exam-Fixation Suppressed    Head Shaking Nystagmus-Horizontal mild sx      Vestibulo-Ocular Reflex   VOR 2 Head and Object (x 2 viewing) slight      Positional Testing   Dix-Hallpike Dix-Hallpike Right;Dix-Hallpike Left    Sidelying Test Sidelying Right;Sidelying Left    Horizontal Canal Testing Horizontal Canal Right;Horizontal Canal Left      Dix-Hallpike Right   Dix-Hallpike Right Symptoms No nystagmus      Dix-Hallpike Left   Dix-Hallpike Left Symptoms No nystagmus      Sidelying Right   Sidelying Right Symptoms No nystagmus      Sidelying Left   Sidelying Left  Symptoms No nystagmus      Horizontal Canal Right   Horizontal Canal Right Symptoms Normal      Horizontal Canal Left   Horizontal Canal Left Symptoms Normal                Objective measurements completed on examination: See above findings.  [facility] Adult PT Treatment/Exercise - 09/16/21 0001       Exercises   Exercises Neck      Neck Exercises: Seated   Neck Retraction 5 reps    Neck Retraction Limitations scapular retraction x 5    Cervical Rotation Both;5 reps    Postural Training sitting tall x 5             Vestibular Treatment/Exercise - 09/16/21 0001       Vestibular Treatment/Exercise   Vestibular Treatment Provided Gaze    Gaze Exercises X1 Viewing Horizontal;Eye/Head Exercise Horizontal;Eye/Head Exercise Vertical      X1 Viewing Horizontal   Foot Position floor    Reps 5      Eye/Head Exercise Horizontal   Foot Position floor    Reps 5      Eye/Head Exercise Vertical   Foot Position floor    Reps 5                    PT Education - 09/16/21 1130     Education Details HEP    Person(s) Educated Patient    Methods Explanation;Handout    Comprehension Verbalized understanding              PT Short Term Goals - 09/16/21 1135       PT SHORT TERM GOAL #1   Title PT to be I in HEP in order to be able to  state that he has not had any sx in the past 10 days    Time 2    Period Weeks    Status New    Target Date 09/27/21                       Plan - 09/16/21 1131     Clinical Impression Statement Mr. Bushart is a 76 yo who was referred to skilled PT due to having a severe case of vertigo on 2/14; since he has only had occasional mild sx but less than one a week.  Evaluation was negative for BPPV.  He did have some sx with eye motion and head motion and noted forward head posture.  Mr Lecomte was given gaze and postural exercises.  We will see him back in two weeks and see if his sx have resolved from these  exercises.    Personal Factors and Comorbidities Age    Examination-Activity Limitations Locomotion Level    Examination-Participation Restrictions Other    Stability/Clinical Decision Making Stable/Uncomplicated    Clinical Decision Making Low    Rehab Potential Good    PT Frequency 1x / week    PT Duration 2 weeks    PT Treatment/Interventions Patient/family education;Therapeutic exercise;Manual techniques    PT Next Visit Plan reassess how sx are probable discharge.    PT Home Exercise Plan horizontal and vertical smooth pursuits, cervical rotation, cervical and scapular retraction and to be more aware of his posture.             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

## 2021-10-03 ENCOUNTER — Ambulatory Visit (HOSPITAL_COMMUNITY): Payer: Medicare Other | Attending: Internal Medicine | Admitting: Physical Therapy

## 2021-10-03 ENCOUNTER — Encounter (HOSPITAL_COMMUNITY): Payer: Self-pay | Admitting: Physical Therapy

## 2021-10-03 ENCOUNTER — Other Ambulatory Visit: Payer: Self-pay

## 2021-10-03 DIAGNOSIS — R42 Dizziness and giddiness: Secondary | ICD-10-CM

## 2021-10-03 DIAGNOSIS — R262 Difficulty in walking, not elsewhere classified: Secondary | ICD-10-CM | POA: Diagnosis not present

## 2021-10-03 NOTE — Therapy (Signed)
?OUTPATIENT PHYSICAL THERAPY TREATMENT NOTE ? ? ?Patient Name: Nicholas Sutton ?MRN: [id number] ?DOB:1946/07/18, 76 y.o., male ?Today's Date: 10/03/2021 ? ?PCP: Redmond School, MD ?REFERRING PROVIDER: Redmond School, MD ?PHYSICAL THERAPY DISCHARGE SUMMARY ? ?Visits from Start of Care: 2 ? ?Current functional level related to goals / functional outcomes: ?Very mild dizziness 2 x since 2/27 ?  ?Remaining deficits: ?Above  ?  ?Education / Equipment: ?Habituation exercises/ postural exercises  ? ?Patient agrees to discharge. Patient goals were partially met. Patient is being discharged due to being pleased with the current functional level.  ? PT End of Session - 10/03/21 0919   ? ? Visit Number 2   ? Number of Visits 2   ? Date for PT Re-Evaluation 09/27/21   ? Authorization Type medicare/ BCBS   ? Progress Note Due on Visit 2   ? PT Start Time [phone]   ? PT Stop Time 7026   ? PT Time Calculation (min) 26 min   ? Activity Tolerance Patient tolerated treatment well   ? Behavior During Therapy [facility] for tasks assessed/performed   ? ?  ?  ? ?  ? ? ?Past Medical History:  ?Diagnosis Date  ? Anxiety   ? Atrial fibrillation ([place])   ? Diarrhea   ? Diverticulitis   ? Dysrhythmia   ? Erectile dysfunction   ? Fistula, intestinovesical   ? related to diverticulitis connecting bladdar and intestines by Dr Excell Seltzer  ? Heart murmur   ? Resolved since valve replacement  ? Left bundle branch block 04/02/2021  ? Nonischemic cardiomyopathy ([place])   ? 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 s/p TAVR with a 23 mm Edwards S3U via the TF approach by Dr. Angelena Form & Dr. Cyndia Bent  ? Severe aortic stenosis   ? ?Past Surgical History:  ?Procedure Laterality Date  ? APPENDECTOMY    ? CARDIAC VALVE REPLACEMENT  04/02/2021  ? COLON RESECTION    ? COLON SURGERY  07/06/2013  ? COLOSTOMY REVISION N/A 07/06/2013  ? Procedure: COLON RESECTION SIGMOID;  Surgeon: Edward Jolly, MD;  [facility];  Service: General;   Laterality: N/A;  ? FISTULOTOMY N/A 07/06/2013  ? Procedure: repair of colovesicle fistula/ FISTULOTOMY;  Surgeon: Edward Jolly, MD;  [facility];  Service: General;  Laterality: N/A;  ? GUM SURGERY    ? HERNIA REPAIR    ? INGUINAL HERNIA REPAIR Left 10/10/2015  ? Procedure: LEFT INGUINAL HERNIA REPAIR ;  Surgeon: Donnie Mesa, MD;  Location: [place];  Service: General;  Laterality: Left;  ? INSERTION OF MESH Left 10/10/2015  ? Procedure: INSERTION OF MESH;  Surgeon: Donnie Mesa, MD;  Location: [place];  Service: General;  Laterality: Left;  ? INSERTION OF MESH Bilateral 08/20/2021  ? Procedure: INSERTION OF MESH;  Surgeon: Ralene Ok, MD;  Location: [place];  Service: General;  Laterality: Bilateral;  ? RIGHT/LEFT HEART CATH AND CORONARY ANGIOGRAPHY N/A 10/09/2016  ? Procedure: Right/Left Heart Cath and Coronary Angiography;  Surgeon: Nelva Bush, MD;  Location: [place] [facility];  Service: Cardiovascular;  Laterality: N/A;  ? RIGHT/LEFT HEART CATH AND CORONARY ANGIOGRAPHY N/A 02/13/2021  ? Procedure: RIGHT/LEFT HEART CATH AND CORONARY ANGIOGRAPHY;  Surgeon: Burnell Blanks, MD;  Location: [place] [facility];  Service: Cardiovascular;  Laterality: N/A;  ? TEE WITHOUT CARDIOVERSION N/A 04/02/2021  ? Procedure: TRANSESOPHAGEAL ECHOCARDIOGRAM (TEE);  Surgeon: Burnell Blanks, MD;  Location: [place] [facility];  Service: Open Heart Surgery;  Laterality: N/A;  ? TRANSCATHETER AORTIC VALVE REPLACEMENT, TRANSFEMORAL N/A 04/02/2021  ? Procedure: TRANSCATHETER AORTIC VALVE REPLACEMENT, TRANSFEMORAL;  Surgeon: Burnell Blanks, MD;  Location: [place] [facility];  Service: Open Heart Surgery;  Laterality: N/A;  ? ?Patient Active Problem List  ? Diagnosis Date Noted  ? New onset left bundle branch block (LBBB) 04/03/2021  ? 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 Severe aortic stenosis   ? Erectile dysfunction 11/03/2016  ? NICM (nonischemic cardiomyopathy)  ([place]) 10/09/2016  ? Diverticulitis of large intestine 07/06/2013  ? Persistent atrial fibrillation ([place]) 07/04/2013  ? 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 DIVERTICULITIS, COLON 09/03/2009  ? ? ?REFERRING DIAG: Vertigo  ? ?THERAPY DIAG:  ?Dizziness and giddiness ? ?Difficulty in walking, not elsewhere classified ? ?PERTINENT HISTORY: PT due to having a severe case of vertigo on 2/14; since he has only had occasional mild sx ? ?PRECAUTIONS: none ? ?SUBJECTIVE:  Pt states that since his last visit on 2/27 he has only had 2-3 times of dizziness that only lasted a few seconds. ?So mild he wasn't sure if he was imagining them or not.  ? ?PAIN:  ?Are you having pain? No ? ? ? ? ?TODAY'S TREATMENT: ? ?           3/15:  Habituation:     sit to Rt side lying : 5 times slight sx lasted a few seconds ?                                            Sit to Lt side lying:  5 times slight sx immediately gone when     he stopped  ?                                            Supine to RT side lying:  5 times mild sx lasting a few seconds more intense than sit to side lying ?                                          Supine to Lt side lying:  5 times mild sx lasting a few seconds more intense than sit to side lying  ?  ?                                         Supine RT head turn x 5 mild symptoms.  ? ?                                         Exercise sitting in good posture/scapular retraction x 10 each.  ? ? ? ? ? ? ?Vestibular Treatment/Exercise - 09/16/21 0001   ?  ?    ?     ?  Vestibular Treatment/Exercise  ?  Vestibular Treatment Provided Gaze   ?  Gaze Exercises X1 Viewing Horizontal;Eye/Head Exercise Horizontal;Eye/Head Exercise Vertical   ?     ?  X1 Viewing  Horizontal  ?  Foot Position floor   ?  Reps 5   ?     ?  Eye/Head Exercise Horizontal  ?  Foot Position floor   ?  Reps 5   ?     ?  Eye/Head Exercise Vertical  ?  Foot Position floor   ?  Reps 5   ? ? ? ?PATIENT EDUCATION: ?Education details: HEP ?Person educated:  Patient ?Education method: Explanation, Verbal cues, and Handouts ?Education comprehension: returned demonstration ? ? ?HOME EXERCISE PROGRAM: ?3/15 habituation exercises: ? ?    sit to Rt side lying, Sit to Lt side lying,supine to RT side lying,  supine to Lt side lying, Supine RT head turn x 5 @  ? ? PT Short Term Goals - 10/03/21 0823   ? ?  ? PT SHORT TERM GOAL #1  ? Title PT to be I in HEP in order to be able to  state that he has not had any sx in the past 10 days   ? Time 2   ? Period Weeks   ? Status Partially met  ? Target Date 09/27/21   ? ?  ?  ? ?  ? ?Assessment:  Pt states he has not felt the "world turning" like he did when he went to the MD.  In the past two weeks he ?Has only had mild sx twice that did not last longer than five seconds, both times he was in bed but does not remember if  ?He was turning or not.  Therapist teste positional changes which did cause mild changes.  Pt was given habituation  ?Exercises and discharged.  ? ? ? Plan - 10/03/21 0824   ? ? Personal Factors and Comorbidities Age   ? Examination-Activity Limitations Locomotion Level   ? Examination-Participation Restrictions Other   ? Stability/Clinical Decision Making Stable/Uncomplicated   ? Rehab Potential Good   ? PT Frequency 1x / week   ? PT Duration 2 weeks   ? PT Treatment/Interventions Patient/family education;Therapeutic exercise;Manual techniques   ? PT Next Visit Plan discharge.   ? PT Home Exercise Plan horizontal and vertical smooth pursuits, cervical rotation, cervical and scapular retraction and to be more aware of his posture. 3/15:  sit to Rt side lying, Sit to Lt side lying,supine to RT side lying,  supine to Lt side lying, Supine RT head turn x 5 @  ? ?  ?  ? ?  ? ? ? ?Rayetta Humphrey, PT CLT ?[phone]  ?10/03/2021, 9:19 AM ? ?   ?

## 2021-11-27 ENCOUNTER — Other Ambulatory Visit: Payer: Self-pay

## 2021-11-27 DIAGNOSIS — Z6821 Body mass index (BMI) 21.0-21.9, adult: Secondary | ICD-10-CM | POA: Diagnosis not present

## 2021-11-27 DIAGNOSIS — Z1331 Encounter for screening for depression: Secondary | ICD-10-CM | POA: Diagnosis not present

## 2021-11-27 DIAGNOSIS — E559 Vitamin D deficiency, unspecified: Secondary | ICD-10-CM | POA: Diagnosis not present

## 2021-11-27 DIAGNOSIS — Z0001 Encounter for general adult medical examination with abnormal findings: Secondary | ICD-10-CM | POA: Diagnosis not present

## 2021-11-27 DIAGNOSIS — Z125 Encounter for screening for malignant neoplasm of prostate: Secondary | ICD-10-CM | POA: Diagnosis not present

## 2021-11-27 DIAGNOSIS — Z79899 Other long term (current) drug therapy: Secondary | ICD-10-CM | POA: Diagnosis not present

## 2021-11-27 DIAGNOSIS — I4891 Unspecified atrial fibrillation: Secondary | ICD-10-CM | POA: Diagnosis not present

## 2021-11-27 DIAGNOSIS — B356 Tinea cruris: Secondary | ICD-10-CM | POA: Diagnosis not present

## 2021-11-27 DIAGNOSIS — D518 Other vitamin B12 deficiency anemias: Secondary | ICD-10-CM | POA: Diagnosis not present

## 2021-11-27 MED ORDER — CARVEDILOL 3.125 MG PO TABS: 3.1250 mg | ORAL_TABLET | Freq: Two times a day (BID) | ORAL | 2 refills | Status: DC

## 2022-01-13 DIAGNOSIS — B078 Other viral warts: Secondary | ICD-10-CM | POA: Diagnosis not present

## 2022-02-11 ENCOUNTER — Other Ambulatory Visit: Payer: Self-pay | Admitting: Physician Assistant

## 2022-02-11 DIAGNOSIS — Z952 Presence of prosthetic heart valve: Secondary | ICD-10-CM

## 2022-03-04 DIAGNOSIS — Z8719 Personal history of other diseases of the digestive system: Secondary | ICD-10-CM | POA: Diagnosis not present

## 2022-03-04 DIAGNOSIS — Z9889 Other specified postprocedural states: Secondary | ICD-10-CM | POA: Diagnosis not present

## 2022-03-04 IMAGING — CT CT CTA ABD/PEL W/CM AND/OR W/O CM
1 series · 13 of 32 positions shown, 16 images · IV contrast (omnipaque)
Comparison: No prior chest CT. CT the abdomen and pelvis
05/09/2013.

CLINICAL DATA: 74-year-old male with history of severe aortic
stenosis. Preprocedural study prior to potential transcatheter
aortic valve replacement (TAVR) procedure.

EXAM:
CT ANGIOGRAPHY CHEST, ABDOMEN AND PELVIS
TECHNIQUE: Multidetector CT imaging through the chest, abdomen and pelvis was
performed using the standard protocol during bolus administration of
intravenous contrast. Multiplanar reconstructed images and MIPs were
obtained and reviewed to evaluate the vascular anatomy.
CONTRAST:  100mL OMNIPAQUE IOHEXOL 350 MG/ML SOLN

[Series 3: ds_cascseq 3.0 sa36 70% (id) · axial · 0.39mm/px · z∈[-298,-146]mm · 13 of 113 slices shown, 16 images]
[im 8/113  soft-tissue]
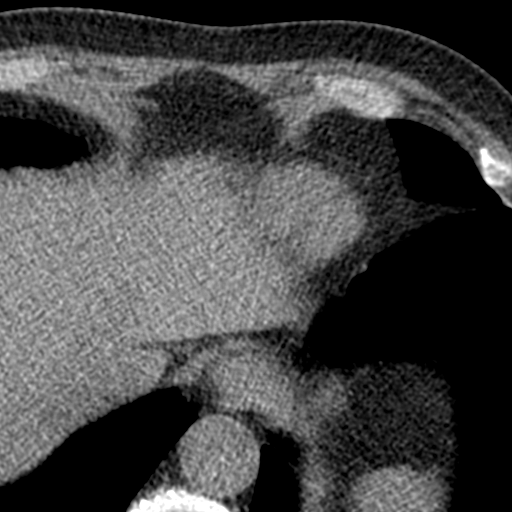
[im 8/113  bone]
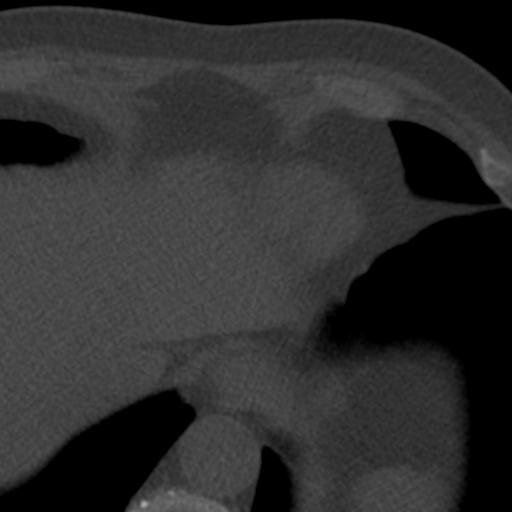
[im 19/113  soft-tissue]
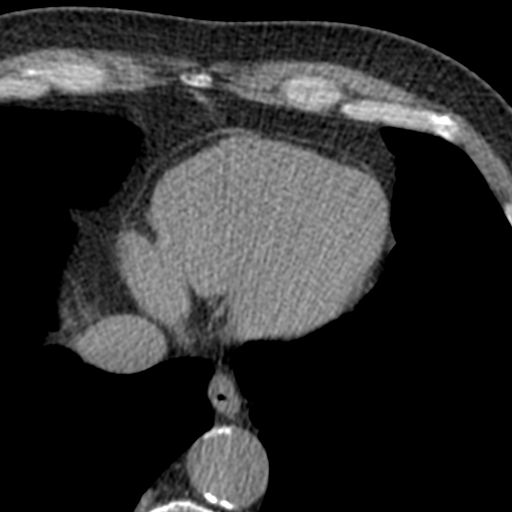
[im 29/113  soft-tissue]
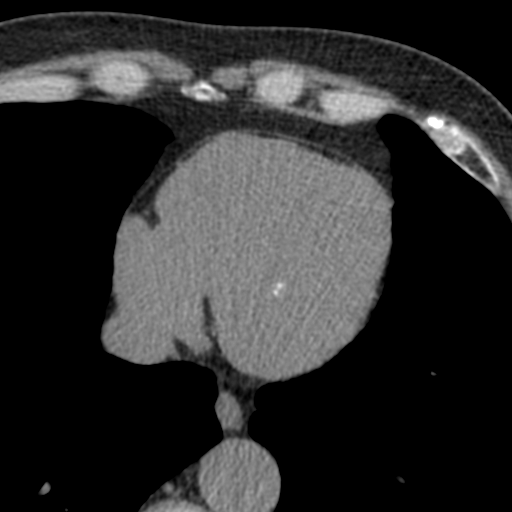
[im 40/113  soft-tissue]
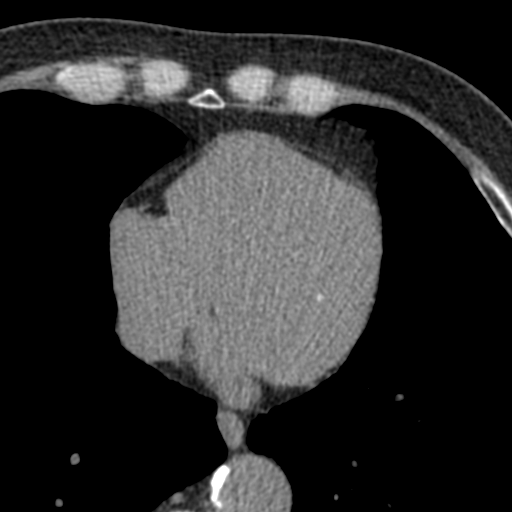
[im 51/113  soft-tissue]
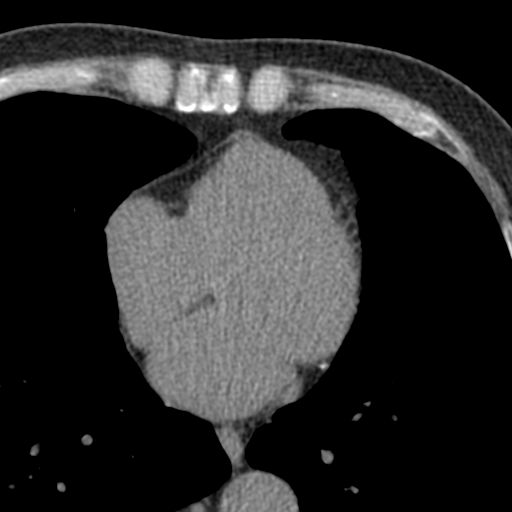
[im 62/113  soft-tissue]
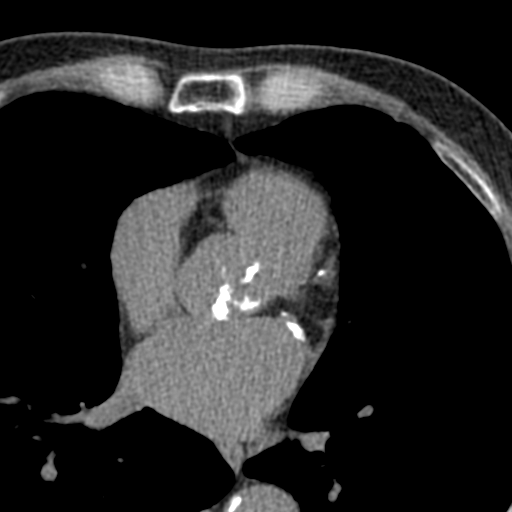
[im 73/113  soft-tissue]
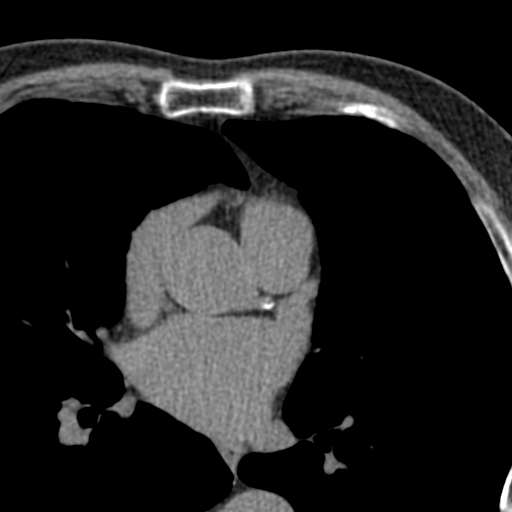
[im 84/113  soft-tissue]
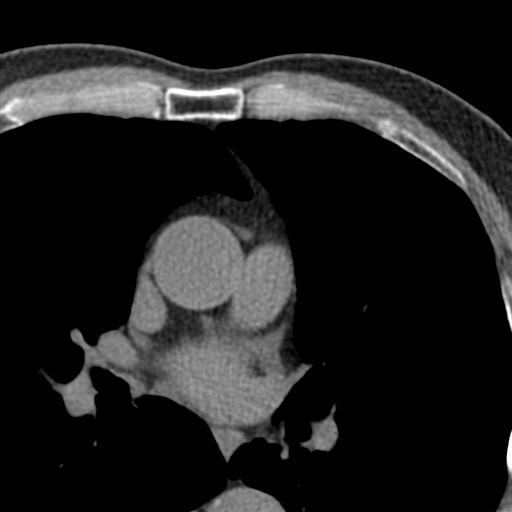
[im 94/113  soft-tissue]
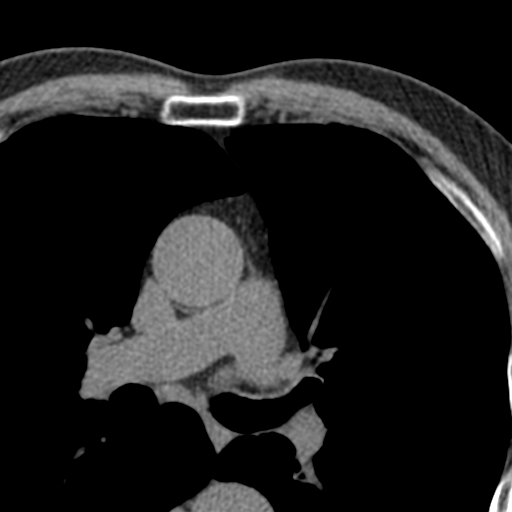
[im 94/113  bone]
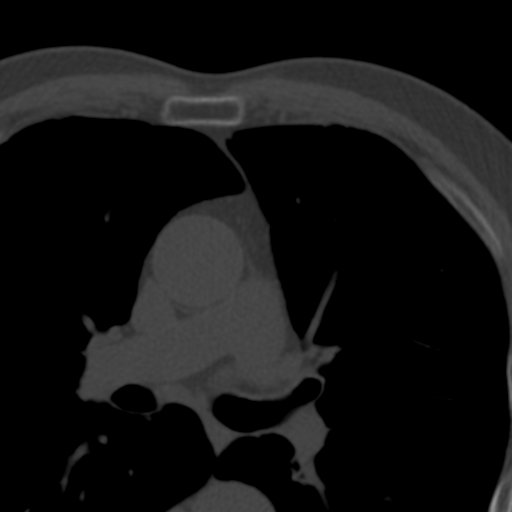
[im 98/113  lung]
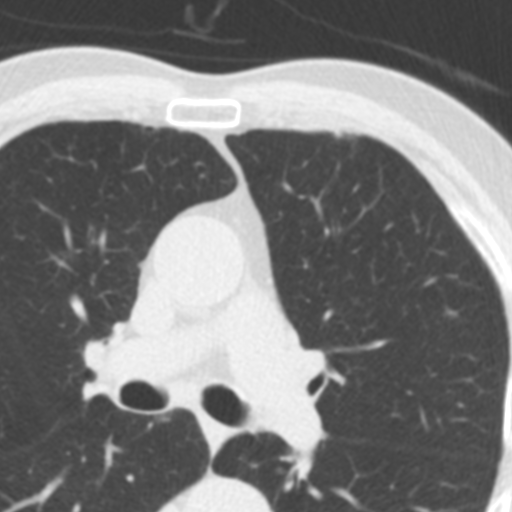
[im 102/113  lung]
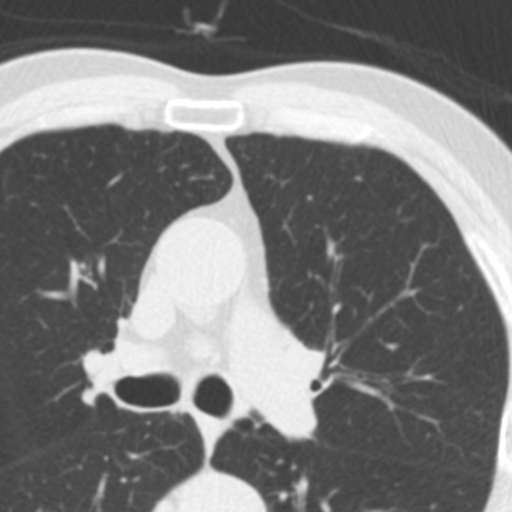
[im 105/113  soft-tissue]
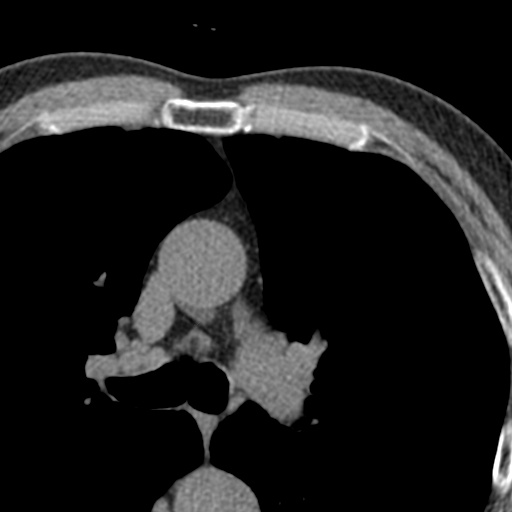
[im 105/113  lung]
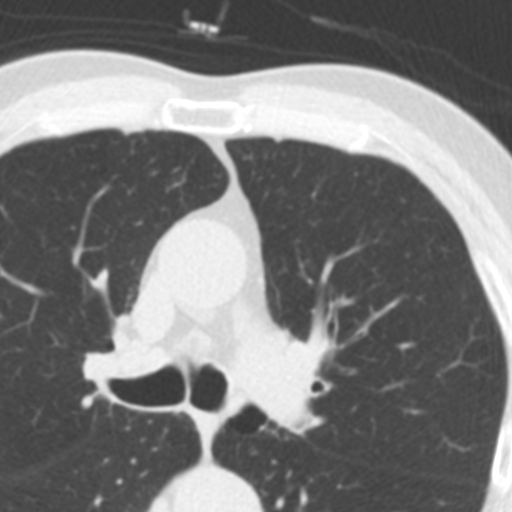
[im 109/113  lung]
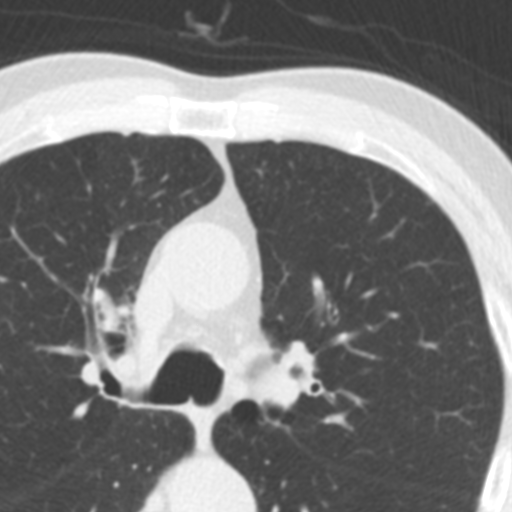

[13 of 32 positions shown; findings below may reference images not displayed]

FINDINGS: CTA CHEST FINDINGS

Cardiovascular: Heart size is mildly enlarged with right atrial and
right ventricular dilatation. There is no significant pericardial
fluid, thickening or pericardial calcification. There is aortic
atherosclerosis, as well as atherosclerosis of the great vessels of
the mediastinum and the coronary arteries, including calcified
atherosclerotic plaque in the left main, left anterior descending,
left circumflex and right coronary arteries. Severe thickening and
calcification of the aortic valve.

Mediastinum/Lymph Nodes: No pathologically enlarged mediastinal or
hilar lymph nodes. Esophagus is unremarkable in appearance. No
axillary lymphadenopathy.

Lungs/Pleura: No suspicious appearing pulmonary nodules or masses
are noted. No acute consolidative airspace disease. No pleural
effusions.

Musculoskeletal/Soft Tissues: At T11 there is a new but nonacute
compression fracture of the superior endplate with 50% loss of
anterior vertebral body height. Mild compression deformity also
noted in the superior endplate of T5 with 20% loss of anterior
vertebral body height. There are no aggressive appearing lytic or
blastic lesions noted in the visualized portions of the skeleton.

CTA ABDOMEN AND PELVIS FINDINGS

Hepatobiliary: Multilobular low-attenuation lesion predominantly in
segment 2 of the liver (axial image 100 of series 5) measuring 2.0 x
1.4 cm, similar to the prior examination, compatible with a hepatic
cyst. No other suspicious hepatic lesions. No intra or extrahepatic
biliary ductal dilatation. Gallbladder is normal in appearance.

Pancreas: No pancreatic mass. No pancreatic ductal dilatation. No
pancreatic or peripancreatic fluid collections or inflammatory
changes.

Spleen: Unremarkable.

Adrenals/Urinary Tract: Bilateral kidneys and bilateral adrenal
glands are normal in appearance. No hydroureteronephrosis. Urinary
bladder is nearly decompressed, but otherwise unremarkable in
appearance.

Stomach/Bowel: The appearance of the stomach is normal. No
pathologic dilatation of small bowel or colon. Status post partial
colectomy with a suture line near the rectosigmoid junction. The
appendix is not confidently identified and may be surgically absent.
Regardless, there are no inflammatory changes noted adjacent to the
cecum to suggest the presence of an acute appendicitis at this time.

Vascular/Lymphatic: Fusiform aneurysmal dilatation of the infrarenal
abdominal aorta which measures up to 3.7 x 3.0 cm. There is also
fusiform ectasia of the distal infrarenal abdominal aorta
immediately above the level of the bifurcation measuring up to 2.3 x
2.6 cm. Vascular findings and measurements pertinent to potential
TAVR procedure, as detailed below. No lymphadenopathy noted in the
abdomen or pelvis.

Reproductive: Prostate gland and seminal vesicles are unremarkable
in appearance.

Other: No significant volume of ascites.  No pneumoperitoneum.

Musculoskeletal: There are no aggressive appearing lytic or blastic
lesions noted in the visualized portions of the skeleton.

VASCULAR MEASUREMENTS PERTINENT TO TAVR:

AORTA:

Minimal Aortic 4iameter-LJ x 13 mm

Severity of Aortic Calcification-severe

RIGHT PELVIS:

Right Common Iliac Artery -

Minimal Riameter-D.D x 6.4 mm

Tortuosity-mild

Calcification-moderate

Right External Iliac Artery -

Minimal 3iameter-O.X x 4.7 mm

Tortuosity - mild

Calcification-minimal

Right Common Femoral Artery -

Minimal Eiameter-1.B x 5.4 mm

Tortuosity - mild

Calcification-mild

LEFT PELVIS:

Left Common Iliac Artery -

Minimal 5iameter-N.1 x 5.5 mm

Tortuosity - mild

Calcification-moderate to severe

Left External Iliac Artery -

Minimal Wiameter-6.6 x 5.5 mm

Tortuosity - mild

Calcification-none

Left Common Femoral Artery -

Minimal 4iameter-0.K x 6.0 mm

Tortuosity - mild

Calcification-mild

Review of the MIP images confirms the above findings.
IMPRESSION: 1. Vascular findings and measurements pertinent to potential TAVR
procedure, as detailed above.
2. Severe thickening calcification of the aortic valve, compatible
with reported clinical history of severe aortic stenosis.
3. Cardiomegaly with right atrial and right ventricular dilatation.
4. Aortic atherosclerosis, in addition to left main and 3 vessel
coronary artery disease. There is also fusiform aneurysmal
dilatation of the infrarenal abdominal aorta which measures up to
3.7 x 3.0 cm (mean diameter of 3.35 cm). Recommend follow-up
ultrasound every 3 years. This recommendation follows ACR consensus
guidelines: White Paper of the ACR Incidental Findings Committee II
5. Additional incidental findings, as above

## 2022-03-04 IMAGING — CT CT ANGIO CHEST
2 of 7 series · 15 of 36 positions shown · IV contrast (omnipaque)
Comparison: No prior chest CT. CT the abdomen and pelvis
05/09/2013.

CLINICAL DATA: 74-year-old male with history of severe aortic
stenosis. Preprocedural study prior to potential transcatheter
aortic valve replacement (TAVR) procedure.

EXAM:
CT ANGIOGRAPHY CHEST, ABDOMEN AND PELVIS
TECHNIQUE: Multidetector CT imaging through the chest, abdomen and pelvis was
performed using the standard protocol during bolus administration of
intravenous contrast. Multiplanar reconstructed images and MIPs were
obtained and reviewed to evaluate the vascular anatomy.
CONTRAST:  100mL OMNIPAQUE IOHEXOL 350 MG/ML SOLN

[Series 1: ax thins · axial · 0.59mm/px · z∈[-666,-36]mm · 14 of 710 slices shown]
[im 40/710  lung]
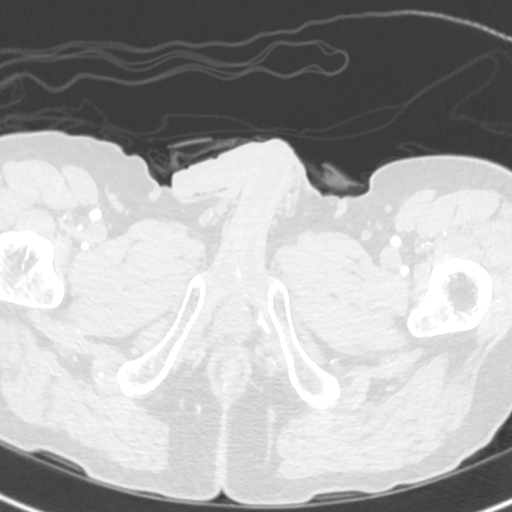
[im 79/710  mediastinal]
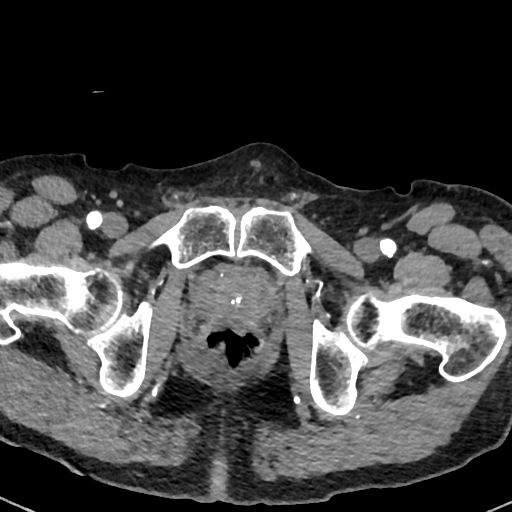
[im 158/710  lung]
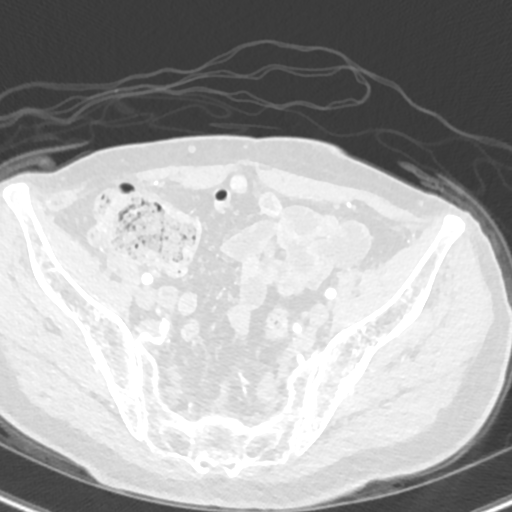
[im 197/710  mediastinal]
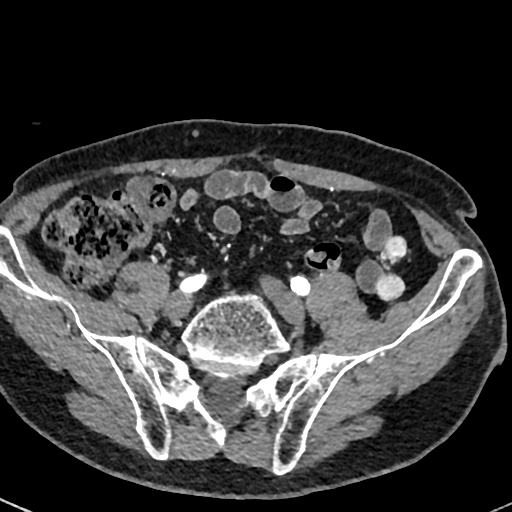
[im 237/710  lung]
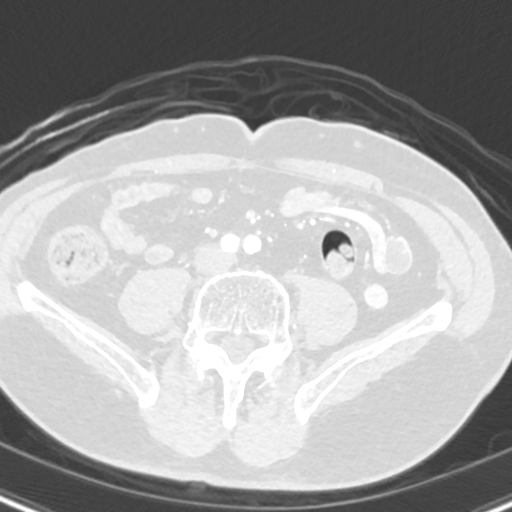
[im 276/710  mediastinal]
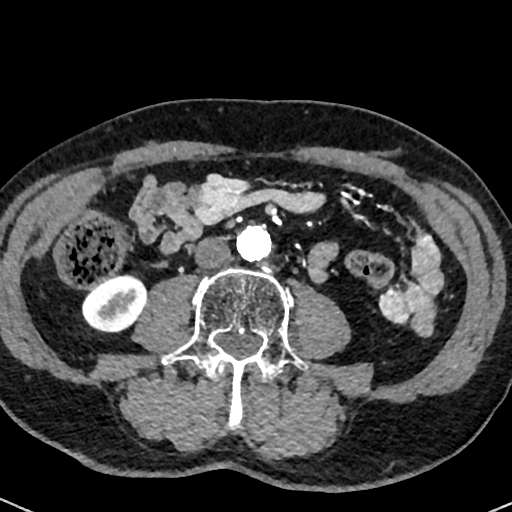
[im 316/710  lung]
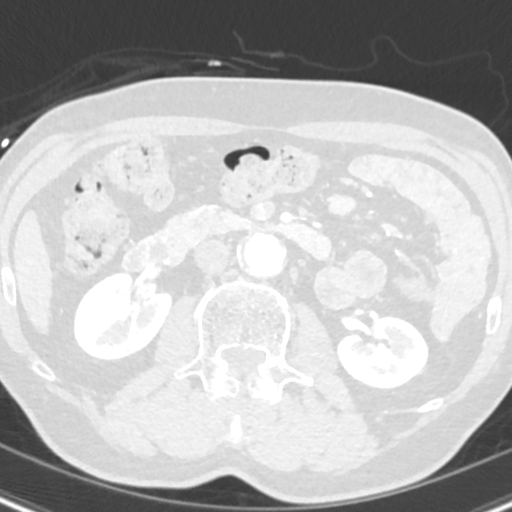
[im 394/710  mediastinal]
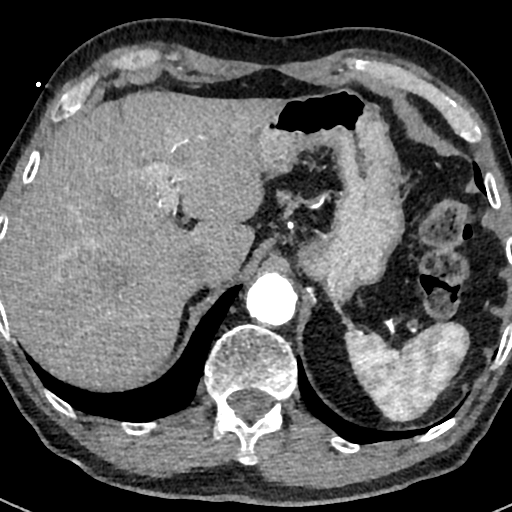
[im 434/710  lung]
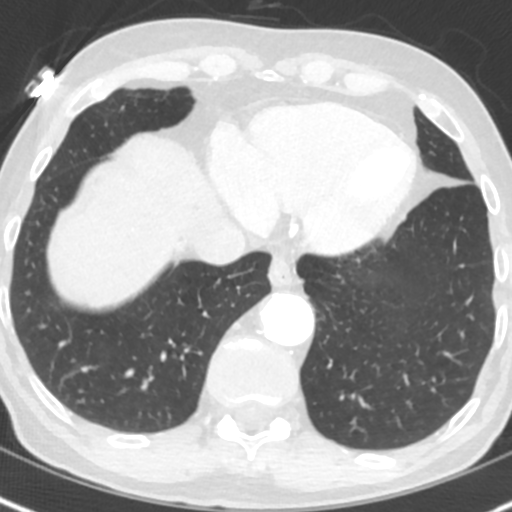
[im 473/710  mediastinal]
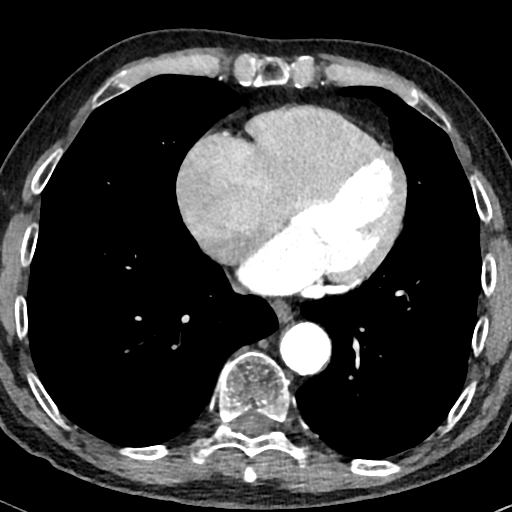
[im 513/710  lung]
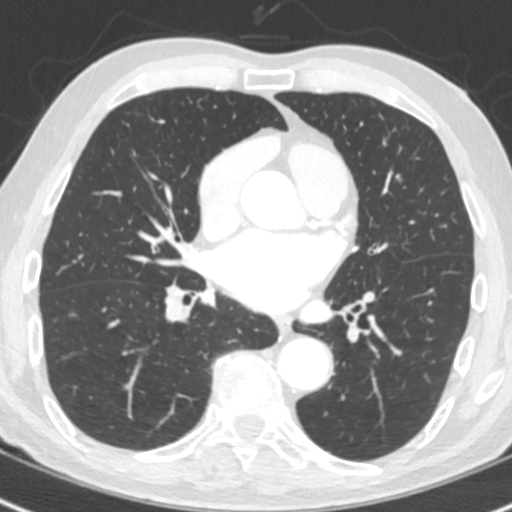
[im 552/710  mediastinal]
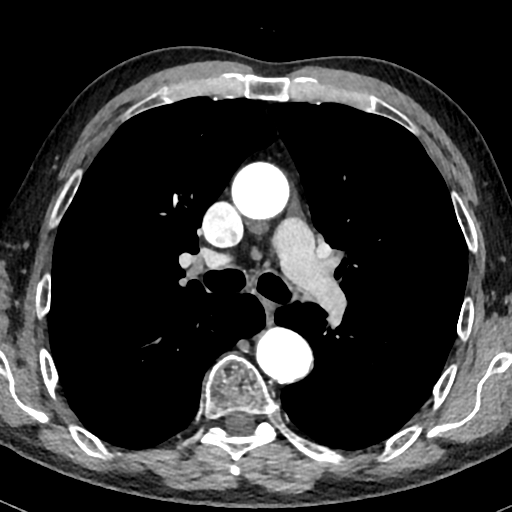
[im 631/710  lung]
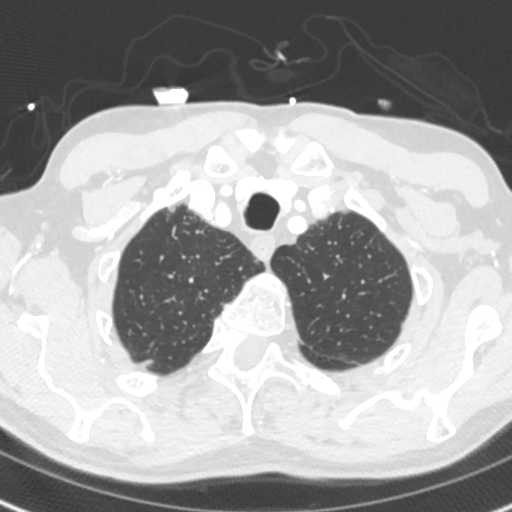
[im 670/710  mediastinal]
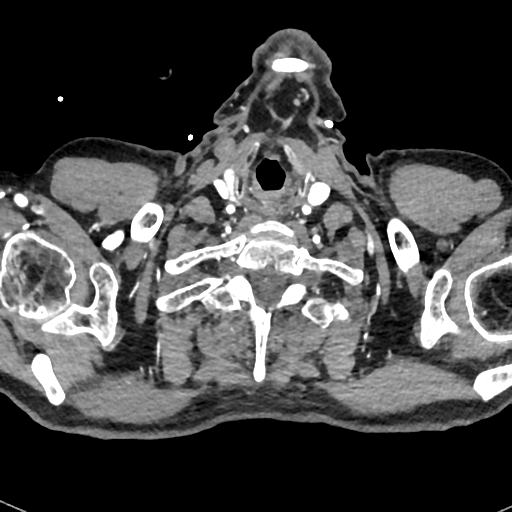

[Series 6: cor · coronal · 0.79mm/px · 1 of 166 slices shown]
[im 83/166  mediastinal]
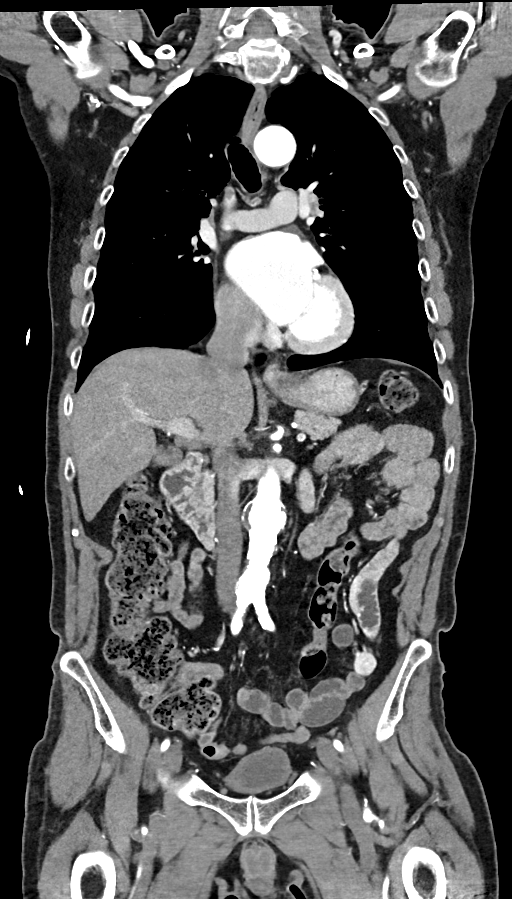

[15 of 36 positions shown; findings below may reference images not displayed]

FINDINGS: CTA CHEST FINDINGS

Cardiovascular: Heart size is mildly enlarged with right atrial and
right ventricular dilatation. There is no significant pericardial
fluid, thickening or pericardial calcification. There is aortic
atherosclerosis, as well as atherosclerosis of the great vessels of
the mediastinum and the coronary arteries, including calcified
atherosclerotic plaque in the left main, left anterior descending,
left circumflex and right coronary arteries. Severe thickening and
calcification of the aortic valve.

Mediastinum/Lymph Nodes: No pathologically enlarged mediastinal or
hilar lymph nodes. Esophagus is unremarkable in appearance. No
axillary lymphadenopathy.

Lungs/Pleura: No suspicious appearing pulmonary nodules or masses
are noted. No acute consolidative airspace disease. No pleural
effusions.

Musculoskeletal/Soft Tissues: At T11 there is a new but nonacute
compression fracture of the superior endplate with 50% loss of
anterior vertebral body height. Mild compression deformity also
noted in the superior endplate of T5 with 20% loss of anterior
vertebral body height. There are no aggressive appearing lytic or
blastic lesions noted in the visualized portions of the skeleton.

CTA ABDOMEN AND PELVIS FINDINGS

Hepatobiliary: Multilobular low-attenuation lesion predominantly in
segment 2 of the liver (axial image 100 of series 5) measuring 2.0 x
1.4 cm, similar to the prior examination, compatible with a hepatic
cyst. No other suspicious hepatic lesions. No intra or extrahepatic
biliary ductal dilatation. Gallbladder is normal in appearance.

Pancreas: No pancreatic mass. No pancreatic ductal dilatation. No
pancreatic or peripancreatic fluid collections or inflammatory
changes.

Spleen: Unremarkable.

Adrenals/Urinary Tract: Bilateral kidneys and bilateral adrenal
glands are normal in appearance. No hydroureteronephrosis. Urinary
bladder is nearly decompressed, but otherwise unremarkable in
appearance.

Stomach/Bowel: The appearance of the stomach is normal. No
pathologic dilatation of small bowel or colon. Status post partial
colectomy with a suture line near the rectosigmoid junction. The
appendix is not confidently identified and may be surgically absent.
Regardless, there are no inflammatory changes noted adjacent to the
cecum to suggest the presence of an acute appendicitis at this time.

Vascular/Lymphatic: Fusiform aneurysmal dilatation of the infrarenal
abdominal aorta which measures up to 3.7 x 3.0 cm. There is also
fusiform ectasia of the distal infrarenal abdominal aorta
immediately above the level of the bifurcation measuring up to 2.3 x
2.6 cm. Vascular findings and measurements pertinent to potential
TAVR procedure, as detailed below. No lymphadenopathy noted in the
abdomen or pelvis.

Reproductive: Prostate gland and seminal vesicles are unremarkable
in appearance.

Other: No significant volume of ascites.  No pneumoperitoneum.

Musculoskeletal: There are no aggressive appearing lytic or blastic
lesions noted in the visualized portions of the skeleton.

VASCULAR MEASUREMENTS PERTINENT TO TAVR:

AORTA:

Minimal Aortic 4iameter-LJ x 13 mm

Severity of Aortic Calcification-severe

RIGHT PELVIS:

Right Common Iliac Artery -

Minimal Riameter-D.D x 6.4 mm

Tortuosity-mild

Calcification-moderate

Right External Iliac Artery -

Minimal 3iameter-O.X x 4.7 mm

Tortuosity - mild

Calcification-minimal

Right Common Femoral Artery -

Minimal Eiameter-1.B x 5.4 mm

Tortuosity - mild

Calcification-mild

LEFT PELVIS:

Left Common Iliac Artery -

Minimal 5iameter-N.1 x 5.5 mm

Tortuosity - mild

Calcification-moderate to severe

Left External Iliac Artery -

Minimal Wiameter-6.6 x 5.5 mm

Tortuosity - mild

Calcification-none

Left Common Femoral Artery -

Minimal 4iameter-0.K x 6.0 mm

Tortuosity - mild

Calcification-mild

Review of the MIP images confirms the above findings.
IMPRESSION: 1. Vascular findings and measurements pertinent to potential TAVR
procedure, as detailed above.
2. Severe thickening calcification of the aortic valve, compatible
with reported clinical history of severe aortic stenosis.
3. Cardiomegaly with right atrial and right ventricular dilatation.
4. Aortic atherosclerosis, in addition to left main and 3 vessel
coronary artery disease. There is also fusiform aneurysmal
dilatation of the infrarenal abdominal aorta which measures up to
3.7 x 3.0 cm (mean diameter of 3.35 cm). Recommend follow-up
ultrasound every 3 years. This recommendation follows ACR consensus
guidelines: White Paper of the ACR Incidental Findings Committee II
5. Additional incidental findings, as above

## 2022-03-04 IMAGING — CT CT HEART MORP W/ CTA COR W/ SCORE W/ CA W/CM &/OR W/O CM
2 of 6 series · 12 of 20 positions shown, 14 images · IV contrast (APPLIED)
Comparison: None.
COMPARISON: None.

Addendum:
EXAM:
OVER-READ INTERPRETATION  CT CHEST

The following report is an over-read performed by radiologist Dr.
Eloina Krebs [REDACTED] on 02/19/2021. This
over-read does not include interpretation of cardiac or coronary
anatomy or pathology. The coronary calcium score/coronary CTA
interpretation by the cardiologist is attached.
CLINICAL DATA: Severe Aortic Stenosis.
Cardiac TAVR CT
TECHNIQUE: A non-contrast, gated CT scan was obtained with axial slices of 3 mm
through the heart for aortic valve calcium scoring. A 120 kV
retrospective, gated, contrast cardiac scan was obtained. Gantry
rotation speed was 250 msecs and collimation was 0.6 mm.
Nitroglycerin was not given. The 3D data set was reconstructed in 5%
intervals of the 0-95% of the R-R cycle. Systolic and diastolic
phases were analyzed on a dedicated workstation using MPR, MIP, and
VRT modes. The patient received 100 cc of contrast.

[Series 5: 0-90% · axial · 0.41mm/px · z∈[-281,-111]mm · 6 of 3960 slices shown, 8 images]
[im 566/3960  vessel]
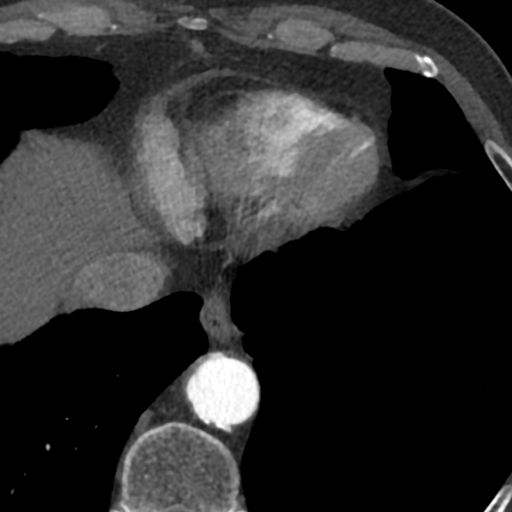
[im 566/3960  lung]
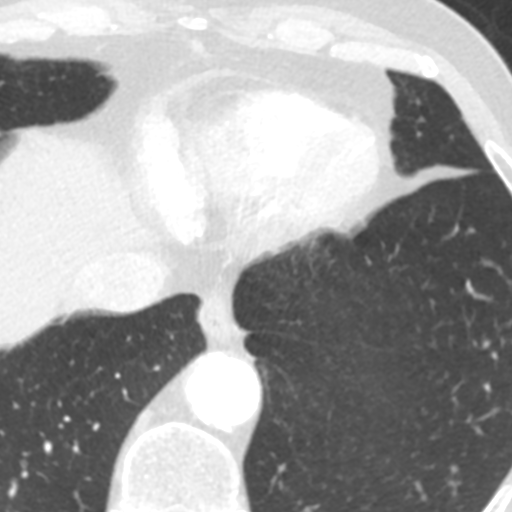
[im 1132/3960  vessel]
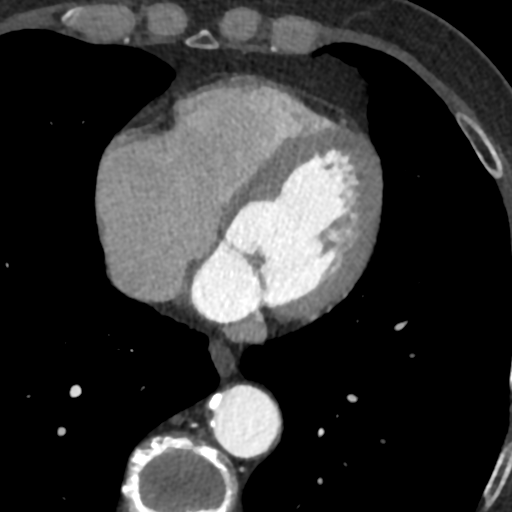
[im 1697/3960  vessel]
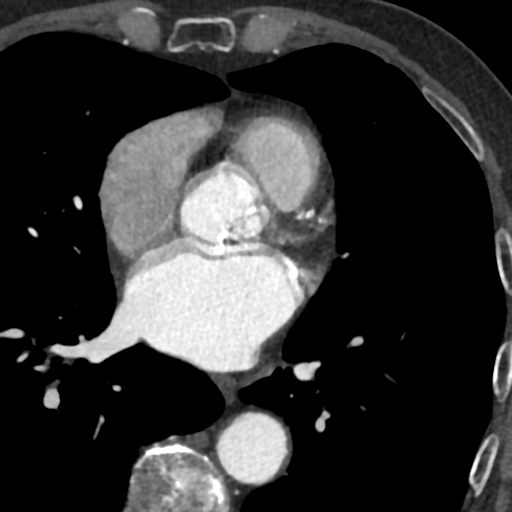
[im 2263/3960  vessel]
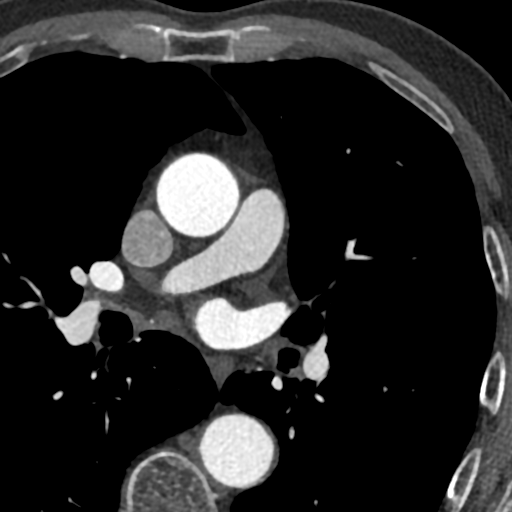
[im 2828/3960  vessel]
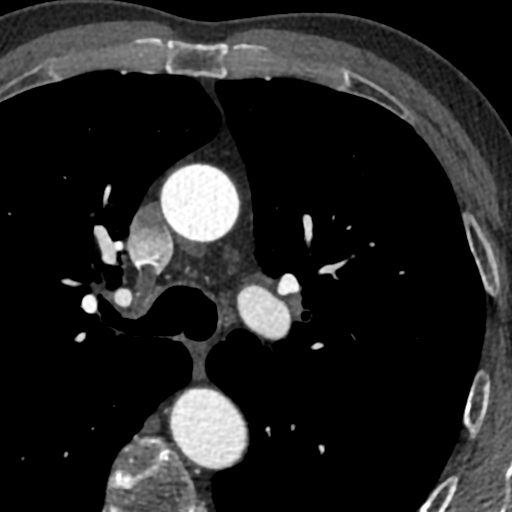
[im 2828/3960  lung]
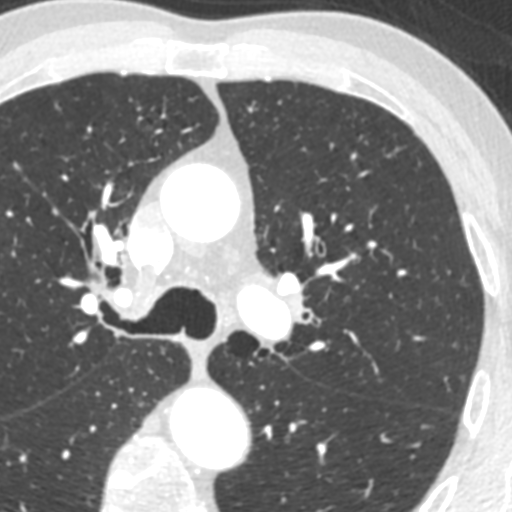
[im 3394/3960  vessel]
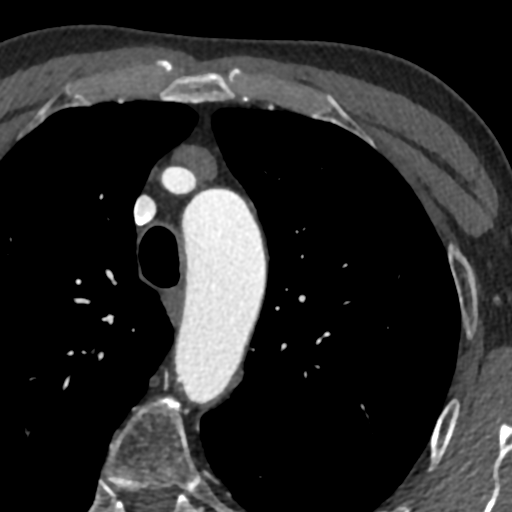

[Series 6: 5-95% · axial · 0.41mm/px · z∈[-281,-111]mm · 6 of 3960 slices shown]
[im 566/3960  vessel]
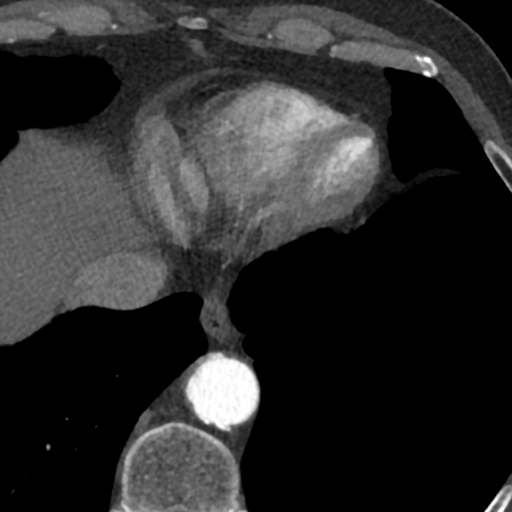
[im 1132/3960  vessel]
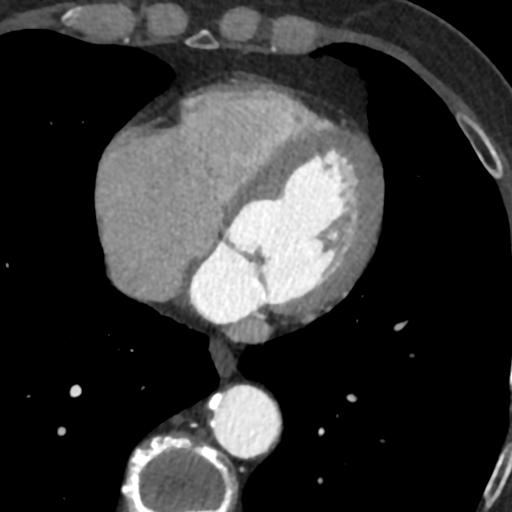
[im 1697/3960  vessel]
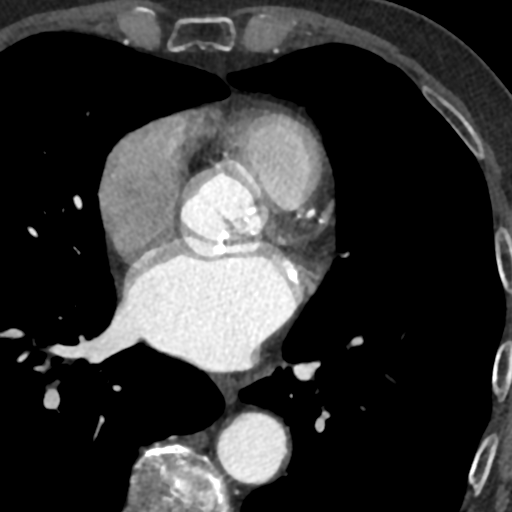
[im 2263/3960  vessel]
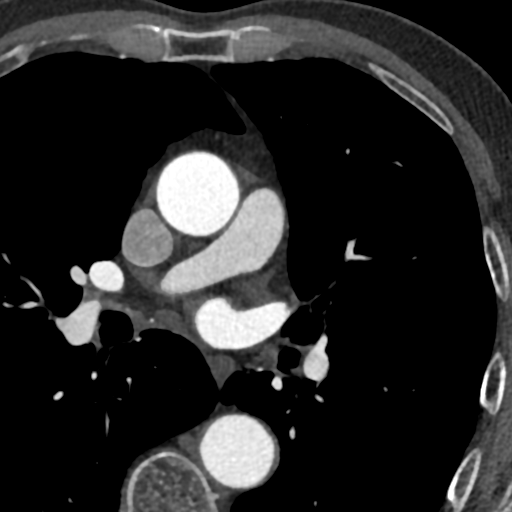
[im 2828/3960  vessel]
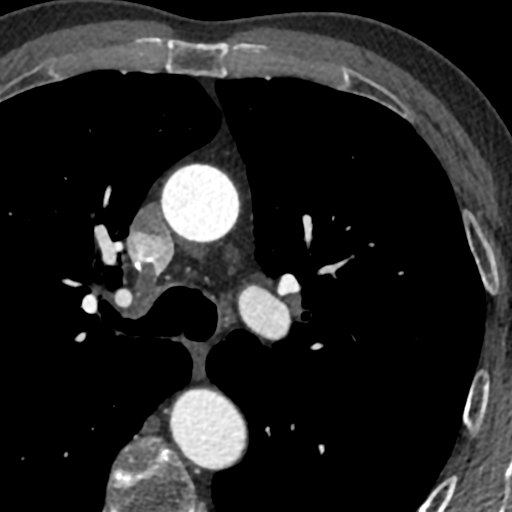
[im 3394/3960  vessel]
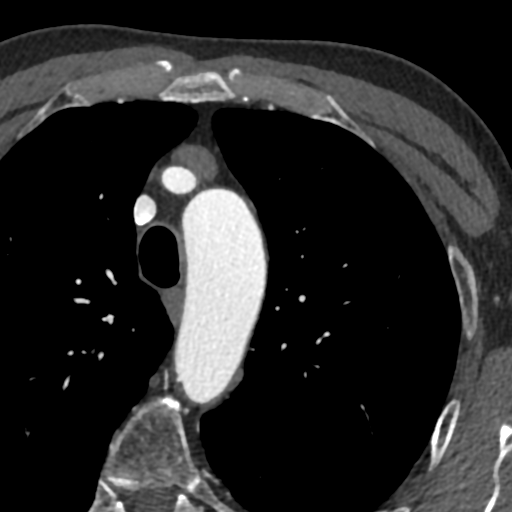

[12 of 20 positions shown; findings below may reference images not displayed]

FINDINGS: Extracardiac findings will be described separately under dictation
for contemporaneously obtained CTA chest, abdomen and pelvis.
IMPRESSION: Please see separate dictation for contemporaneously obtained CTA
chest, abdomen and pelvis dated 02/19/2021 for full description of
relevant extracardiac findings.
FINDINGS: Image quality: Excellent.

Noise artifact is: Limited.

Valve Morphology: The aortic valve is tricuspid with diffuse, severe
calcifications. There is bulky calcification of the NCC noted. The
leaflets are severely restricted in systole consistent with severe
aortic stenosis.

Aortic Valve Calcium score: 9456

Aortic annular dimension:

Phase assessed: 290 ms

Annular area: 405 mm2

Annular perimeter: 72.6 mm

Max diameter: 26.2 mm

Min diameter: 19.8 mm

Annular and subannular calcification: None.

Optimal coplanar projection: LAO 8 HADJIJA 8

Coronary Artery Height above Annulus:

Left Main: 14.1 mm

Right Coronary: 15.9 mm

Sinus of Valsalva Measurements:

Non-coronary: 33 mm

Right-coronary: 33 mm

Left-coronary: 34 mm

Sinus of Valsalva Height:

Non-coronary: 25.6 mm

Right-coronary: 21.6 mm

Left-coronary: 21.3 mm

Sinotubular Junction: 31 mm

Ascending Thoracic Aorta: 34 mm

Coronary Arteries: Normal coronary origin. Left dominance. The study
was performed without use of NTG and is insufficient for plaque
evaluation. Please refer to recent cardiac catheterization for
coronary assessment. Mild coronary calcifications noted.

Cardiac Morphology:

Right Atrium: Right atrial size is dilated.

Right Ventricle: The right ventricular cavity is within normal
limits.

Left Atrium: Left atrial size is dilated. There is a filling defect
in the OURARI which is consistent with mixing artifact.

Left Ventricle: The ventricular cavity size is within normal limits.
There are no stigmata of prior infarction. There is no abnormal
filling defect.

Pulmonary arteries: Normal in size without proximal filling defect.

Pulmonary veins: Normal pulmonary venous drainage.

Pericardium: Normal thickness with no significant effusion or
calcium present.

Mitral Valve: The mitral valve is normal structure without
significant calcification.

Extra-cardiac findings: See attached radiology report for
non-cardiac structures.
IMPRESSION: 1. Tricuspid aortic valve with severe calcifications. Bulky
calcification of the NCC noted.

2. Annular measurements appropriate for 23 mm S3 (405 mm2).
Measurements made at 290 ms due to motion artifact.

3. No significant annular or subannular calcifications.

4. Sufficient coronary to annulus distance.

5. Optimal Fluoroscopic Angle for Delivery: LAO 8 HADJIJA 8

6. There is a filling defect in the OURARI which is consistent with
mixing artifact.

*** End of Addendum ***
EXAM:
OVER-READ INTERPRETATION  CT CHEST

The following report is an over-read performed by radiologist Dr.
Eloina Krebs [REDACTED] on 02/19/2021. This
over-read does not include interpretation of cardiac or coronary
anatomy or pathology. The coronary calcium score/coronary CTA
interpretation by the cardiologist is attached.
FINDINGS: Extracardiac findings will be described separately under dictation
for contemporaneously obtained CTA chest, abdomen and pelvis.
IMPRESSION: Please see separate dictation for contemporaneously obtained CTA
chest, abdomen and pelvis dated 02/19/2021 for full description of
relevant extracardiac findings.

## 2022-03-05 DIAGNOSIS — S1086XA Insect bite of other specified part of neck, initial encounter: Secondary | ICD-10-CM | POA: Diagnosis not present

## 2022-03-05 DIAGNOSIS — C44212 Basal cell carcinoma of skin of right ear and external auricular canal: Secondary | ICD-10-CM | POA: Diagnosis not present

## 2022-03-12 ENCOUNTER — Ambulatory Visit (INDEPENDENT_AMBULATORY_CARE_PROVIDER_SITE_OTHER): Payer: Medicare Other | Admitting: Physician Assistant

## 2022-03-12 ENCOUNTER — Ambulatory Visit (HOSPITAL_COMMUNITY): Payer: Medicare Other | Attending: Cardiovascular Disease

## 2022-03-12 VITALS — BP 100/70 | HR 76 | Ht 72.0 in | Wt 156.0 lb

## 2022-03-12 DIAGNOSIS — E785 Hyperlipidemia, unspecified: Secondary | ICD-10-CM

## 2022-03-12 DIAGNOSIS — Z952 Presence of prosthetic heart valve: Secondary | ICD-10-CM | POA: Insufficient documentation

## 2022-03-12 DIAGNOSIS — I4819 Other persistent atrial fibrillation: Secondary | ICD-10-CM

## 2022-03-12 DIAGNOSIS — I428 Other cardiomyopathies: Secondary | ICD-10-CM | POA: Diagnosis not present

## 2022-03-12 DIAGNOSIS — I714 Abdominal aortic aneurysm, without rupture, unspecified: Secondary | ICD-10-CM

## 2022-03-12 DIAGNOSIS — I2510 Atherosclerotic heart disease of native coronary artery without angina pectoris: Secondary | ICD-10-CM

## 2022-03-12 LAB — ECHOCARDIOGRAM COMPLETE
AV Mean grad: 13 mmHg
AV Peak grad: 23.6 mmHg
Ao pk vel: 2.43 m/s
P 1/2 time: 459 msec
S' Lateral: 3.3 cm

## 2022-03-12 NOTE — Patient Instructions (Addendum)
Medication Instructions:  Your physician recommends that you continue on your current medications as directed. Please refer to the Current Medication list given to you today.  *If you need a refill on your cardiac medications before your next appointment, please call your pharmacy*   Lab Work: None ordered   If you have labs (blood work) drawn today and your tests are completely normal, you will receive your results only by: [place] (if you have MyChart) OR A paper copy in the mail If you have any lab test that is abnormal or we need to change your treatment, we will call you to review the results.   Testing/Procedures: Your physician has requested that you have an echocardiogram in 1 year . Echocardiography is a painless test that uses sound waves to create images of your heart. It provides your doctor with information about the size and shape of your heart and how well your heart's chambers and valves are working. This procedure takes approximately one hour. There are no restrictions for this procedure.    Follow-Up: At [facility], you and your health needs are our priority.  As part of our continuing mission to provide you with exceptional heart care, we have created designated Provider Care Teams.  These Care Teams include your primary Cardiologist (physician) and Advanced Practice Providers (APPs -  Physician Assistants and Nurse Practitioners) who all work together to provide you with the care you need, when you need it.  We recommend signing up for the patient portal called "MyChart".  Sign up information is provided on this After Visit Summary.  MyChart is used to connect with patients for Virtual Visits (Telemedicine).  Patients are able to view lab/test results, encounter notes, upcoming appointments, etc.  Non-urgent messages can be sent to your provider as well.   To learn more about what you can do with MyChart, go to [email or web address].    Your next  appointment:   12 month(s)  The format for your next appointment:   In Person  Provider:   Lauree Chandler, MD     Other Instructions   Important Information About Sugar

## 2022-03-12 NOTE — Progress Notes (Signed)
[facility] [place]                                     Cardiology Office Note:    Date:  03/12/2022   ID:  Nicholas Sutton, DOB [birth year], MRN [id number]  PCP:  Redmond School, MD  [facility] Cardiologist:  Lauree Chandler, MD  [facility] Electrophysiologist:  None   Referring MD: Redmond School, MD   1 year s/p TAVR  History of Present Illness:    Nicholas Sutton is a 76 y.o. male with a hx of non-ischemic cardiomyopathy with LV normalization, former tobacco abuse, persistent atrial fibrillation on Eliquis, severe aortic stenosis s/p TAVR (04/02/21) who presents to clinic for follow up.   He was followed for aortic stenosis for several years and then underwent TAVR with a 23 mm Edwards Sapien 3 THV on 04/02/21. Cardiac cath July 2022 with mild CAD. 1 month echo 05/01/21 with LVEF=50-55%, normal RV function, severe dilation both atria and normally functioning AVR with mean gradient 12 mmHg.    Had hernia repair in 07/2021.  He was last seen in our office in 08/2021 for dizziness felt to be c/w vertigo. He was trialed on Meclizine. This self resolved.   Today the patient presents to clinic for follow up. No CP or SOB. No LE edema, orthopnea or PND. No more dizziness. No syncope. No blood in stool or urine. No palpitations. Walks on a treadmill everyday with no issues.    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 Angelena Form & Dr. Cyndia Bent   Severe aortic stenosis      Past Surgical History:  Procedure Laterality Date   APPENDECTOMY     CARDIAC VALVE REPLACEMENT  04/02/2021   COLON RESECTION     COLON SURGERY  07/06/2013   COLOSTOMY REVISION N/A 07/06/2013   Procedure: COLON RESECTION SIGMOID;  Surgeon: Edward Jolly, MD;  Location: [facility];  Service: General;  Laterality: N/A;   FISTULOTOMY N/A 07/06/2013   Procedure: repair of colovesicle fistula/ FISTULOTOMY;  Surgeon: Edward Jolly, MD;  Location: [facility];  Service: General;  Laterality: N/A;   GUM [place] Left 10/10/2015   Procedure: LEFT INGUINAL HERNIA REPAIR ;  Surgeon: Donnie Mesa, MD;  Location: [place];  Service: General;  Laterality: Left;   INSERTION OF MESH Left 10/10/2015   Procedure: INSERTION OF MESH;  Surgeon: Donnie Mesa, MD;  Location: [place];  Service: General;  Laterality: Left;   INSERTION OF MESH Bilateral 08/20/2021   Procedure: INSERTION OF MESH;  Surgeon: Ralene Ok, MD;  Location: [place];  Service: General;  Laterality: Bilateral;   RIGHT/LEFT HEART CATH AND CORONARY ANGIOGRAPHY N/A 10/09/2016   Procedure: Right/Left Heart Cath and Coronary Angiography;  Surgeon: Nelva Bush, MD;  Location: [place] [facility];  Service: Cardiovascular;  Laterality: N/A;   RIGHT/LEFT HEART CATH AND CORONARY ANGIOGRAPHY N/A 02/13/2021   Procedure: RIGHT/LEFT HEART CATH AND CORONARY ANGIOGRAPHY;  Surgeon: Burnell Blanks, MD;  Location: [place] [facility];  Service: Cardiovascular;  Laterality: N/A;   TEE WITHOUT CARDIOVERSION N/A 04/02/2021   Procedure: TRANSESOPHAGEAL ECHOCARDIOGRAM (TEE);  Surgeon: Burnell Blanks, MD;  Location: [place] [facility];  Service: Open Heart Surgery;  Laterality: N/A;   TRANSCATHETER AORTIC VALVE REPLACEMENT, TRANSFEMORAL N/A 04/02/2021   Procedure: TRANSCATHETER AORTIC VALVE REPLACEMENT, TRANSFEMORAL;  Surgeon: Burnell Blanks, MD;  Location: [place] [facility];  Service:  Open Heart Surgery;  Laterality: N/A;    Current Medications: Current Meds  Medication Sig   acetaminophen (TYLENOL) 500 MG tablet Take 1,000 mg by mouth every 6 (six) hours as needed for headache.   apixaban (ELIQUIS) 5 MG TABS tablet TAKE 1 TABLET(5 MG) BY MOUTH TWICE DAILY   Bioflavonoid Products (ESTER-C) 500-550 MG TABS Take 1 tablet by mouth every morning.   carvedilol (COREG) 3.125 MG tablet Take 1 tablet (3.125 mg total) by mouth 2 (two) times daily.   cholecalciferol (VITAMIN D) 25 MCG (1000 UNIT) tablet Take 1,000 Units by mouth in the morning and at bedtime.   fexofenadine (ALLEGRA) 180 MG tablet Take 180 mg by mouth daily as needed for allergies or rhinitis.   losartan (COZAAR) 25 MG tablet TAKE 1 TABLET(25 MG) BY MOUTH DAILY   meclizine (ANTIVERT) 25 MG tablet Take 1 tablet (25 mg total) by mouth 3 (three) times daily as needed for dizziness.   Multiple Vitamins-Minerals (PRESERVISION AREDS 2 PO) Take 1 tablet by mouth in the morning and at bedtime.   Omega-3 Fatty Acids (FISH OIL) 1000 MG CAPS Take 1,000 mg by mouth in the morning and at bedtime.   Polyvinyl Alcohol-Povidone PF (REFRESH) 1.4-0.6 % SOLN Place 1 drop into both eyes daily as needed (dry eyes).   tadalafil (CIALIS) 5 MG tablet Take 5 mg by mouth daily as needed for erectile dysfunction.   vitamin C (ASCORBIC ACID) 500 MG tablet Take 500 mg by mouth every evening.     Allergies:   Dust mite extract and Pollen extract   Social History   Socioeconomic History   Marital status: Significant Other    Spouse name: Not on file   Number of children: Not on file   Years of education: Not on file   Highest education level: Not on file  Occupational History   Not on file  Tobacco Use   Smoking status: Former    Packs/day: 1.50    Years: 30.00    Total pack years: 45.00    Types: Cigarettes    Quit date: 06/03/2008    Years since quitting: 13.7   Smokeless tobacco: Never  Vaping Use   Vaping Use: Never used   Substance and Sexual Activity   Alcohol use: Not Currently    Comment: rare   Drug use: No   Sexual activity: Yes    Birth control/protection: None  Other Topics Concern   Not on file  Social History Narrative   Not on file   Social Determinants of Health   Financial Resource Strain: Not on file  Food Insecurity: Not on file  Transportation Needs: Not on file  Physical Activity: Not on file  Stress: Not on file  Social Connections: Not on file     Family History: The patient's family history includes Heart  attack (age of onset: 32) in his father; Hypertension in his unknown relative; Stroke (age of onset: 73) in his mother.  ROS:   Please see the history of present illness.    All other systems reviewed and are negative.  EKGs/Labs/Other Studies Reviewed:    The following studies were reviewed today:   04/02/21: TAVR OPERATIVE NOTE     Date of Procedure:                04/02/2021   Preoperative Diagnosis:      Severe Aortic Stenosis    Postoperative Diagnosis:    Same    Procedure:        Transcatheter Aortic Valve Replacement - Percutaneous Right Transfemoral Approach             Edwards Sapien 3 Ultra THV (size 23 mm, model # 9750TFX, serial # [id number])              Co-Surgeons:                        Gaye Pollack, MD and Lauree Chandler, MD     Anesthesiologist:                  Suella Broad, MD   Echocardiographer:              Bertrum Sol, MD   Pre-operative Echo Findings: Severe aortic stenosis Normal left ventricular systolic function   Post-operative Echo Findings: No paravalvular leak Normal left ventricular systolic function   --------------------------------   Echo 04/03/21: IMPRESSIONS   1. Left ventricular ejection fraction, by estimation, is 50 to 55%. The  left ventricle has low normal function. The left ventricle has no regional  wall motion abnormalities. Left ventricular diastolic function could not  be evaluated.   2.  Right ventricular systolic function is normal. The right ventricular  size is mildly enlarged. There is normal pulmonary artery systolic  pressure.   3. Left atrial size was mildly dilated.   4. Right atrial size was moderately dilated.   5. The mitral valve is normal in structure. Trivial mitral valve  regurgitation.   6. The aortic valve has been repaired/replaced. Aortic valve  regurgitation is trivial. There is a Edwards Ultra, stented (TAVR) valve  present in the aortic position. Procedure Date: 04/02/2021. Echo findings  are consistent with a tiny perivalvular leak  of the aortic prosthesis. Aortic valve area, by VTI measures 0.96 cm.  Aortic valve mean gradient measures 16.0 mmHg. Aortic valve Vmax measures  2.60 m/s. Aortic valve acceleration time measures 71 msec. The  dimensionless index and the valve area likely  overestimate the degree of aortic valve obstruction due to inaccurate LVOT  pulsed Doppler sampling (poor beam alignment).   _______________________  ZIO AT   Patch Wear Time:  8 days and 5 hours (2022-09-14T11:24:13-0400 to 2022-09-22T17:11:21-0400)   1 run of Ventricular Tachycardia occurred lasting 9 beats with a max rate of 164 bpm (avg 139 bpm).  Persistent Atrial Fibrillation. Heart rate ranging from 40-174 bpm (avg of 90 bpm). Intermittent Bundle Branch Block was present. Isolated PVCs were rare (<1.0%)   No high grade AV block noted  _______________________  Echo 05/01/21 IMPRESSIONS  1. Left ventricular ejection fraction, by estimation, is 50 to 55%. The  left ventricle has low normal function. The left ventricle has no regional  wall motion abnormalities. Left ventricular diastolic function could not  be evaluated.   2.  Right ventricular systolic function is normal. The right ventricular  size is normal.   3. Left atrial size was severely dilated.   4. Right atrial size was severely dilated.   5. The mitral valve is normal in structure. Trivial  mitral valve  regurgitation. No evidence of mitral stenosis.   6. Tricuspid valve regurgitation is mild to moderate.   7. The aortic valve has been repaired/replaced. Aortic valve  regurgitation is not visualized. Mild aortic valve stenosis. Echo findings  are consistent with normal structure and function of the aortic valve  prosthesis. Aortic valve mean gradient measures   12.0 mmHg. Aortic valve Vmax measures 2.29 m/s.   8. The inferior vena cava is normal in size with greater than 50%  respiratory variability, suggesting right atrial pressure of 3 mmHg.   _________________________   Echo 03/12/22 IMPRESSIONS  1. Left ventricular ejection fraction, by estimation, is 60 to 65%. The left ventricle has normal function. The left ventricle has no regional wall motion abnormalities. There is mild asymmetric left ventricular hypertrophy of the basal and septal  segments. Left ventricular diastolic parameters are indeterminate.  2. Right ventricular systolic function is normal. The right ventricular size is normal.  3. Left atrial size was moderately dilated.  4. Right atrial size was severely dilated.  5. The mitral valve is abnormal. Trivial mitral valve regurgitation. No evidence of mitral stenosis.  6. Tricuspid valve regurgitation is moderate.  7. Post TAVR with 23 mm Sapien 3 valve grade 2 PVL seen best on 5 chamber view mean gradient 13 peak 23.6 mmHg PVL appears worse compared to TTE done 05/01/21 At that time not commented on by reader but was mild grade 1 . The aortic valve has been  repaired/replaced. Aortic valve regurgitation is not visualized. No aortic stenosis is present.  8. The inferior vena cava is normal in size with greater than 50% respiratory variability, suggesting right atrial pressure of 3 mmHg.  EKG:  EKG is NOT ordered today.    Recent Labs: 03/29/2021: ALT 24 05/01/2021: TSH 2.420 08/14/2021: BUN 20; Creatinine, Ser 0.95; Hemoglobin 14.5; Platelets 110; Potassium  4.6; Sodium 134  Recent Lipid Panel    Component Value Date/Time   CHOL 202 (H) 08/13/2021 1019   CHOL 160 01/09/2020 0816   TRIG 46 08/13/2021 1019   HDL 41 08/13/2021 1019   HDL 42 01/09/2020 0816   CHOLHDL 4.9 08/13/2021 1019   VLDL 9 08/13/2021 1019   LDLCALC 152 (H) 08/13/2021 1019   LDLCALC 107 (H) 01/09/2020 0816     Risk Assessment/Calculations:    CHA2DS2-VASc Score =     This indicates a  % annual risk of stroke. The patient's score is based upon:        Physical Exam:    VS:  BP 100/70   Pulse 76   Ht 6' (1.829 m)   Wt 156 lb (70.8 kg)   BMI 21.16 kg/m     Wt Readings from Last 3 Encounters:  03/12/22 156 lb (70.8 kg)  09/05/21 156 lb (70.8 kg)  08/20/21 165 lb (74.8 kg)     GEN:  Well nourished, well developed in no acute distress HEENT: Normal NECK: No JVD LYMPHATICS: No lymphadenopathy CARDIAC: RRR, no murmurs, rubs, gallops RESPIRATORY:  Clear to auscultation without rales, wheezing or rhonchi  ABDOMEN: Soft, non-tender, non-distended MUSCULOSKELETAL:  No edema; No deformity  SKIN: Warm and dry NEUROLOGIC:  Alert and oriented x 3 PSYCHIATRIC:  Normal affect   ASSESSMENT:  1. S/P TAVR (transcatheter aortic valve replacement)   2. Persistent atrial fibrillation ([place])   3. NICM (nonischemic cardiomyopathy) ([place])   4. Abdominal aortic aneurysm (AAA) without rupture, unspecified part ([place])   5. Hyperlipidemia, unspecified hyperlipidemia type     PLAN:    In order of problems listed above:  Severe AS s/p TAVR: echo today shows EF 65%, normally functioning TAVR with a mean gradient of 13 mm hg and grade two PVL seen best on 5 chamber view. (PVL appears worse compared to TTE done 05/01/21. At that time, it was grade one but not commented on by reader). We will continue to follow this over time. He has NYHA class I symptoms and exercises regularly with no issues. SBE prophylaxis discussed; he has amoxicillin. Continue on Eliquis alone.  Continue regular follow up with Dr. Angelena Form.    Permanent atrial fibrillation: continue rate control HR 70s today. Continue Eliquis.   NICM: EF has historically been as low as 35-40%. Echo today showed EF 65%. Continue Losartan and Coreg.    AAA: this was incidentally noted on pre TAVR CT. Infrarenal abdominal aorta which measures 3.7 x 3.0 cm (mean diameter of 3.35 cm). Recommend follow-up US every 3 years. Plan to follow with primary cardiologist.  HLD: refused statin in past. This was not dicussed today.   Medication Adjustments/Labs and Tests Ordered: Current medicines are reviewed at length with the patient today.  Concerns regarding medicines are outlined above.  No orders of the defined types were placed in this encounter.   No orders of the defined types were placed in this encounter.    Patient Instructions  Medication Instructions:  Your physician recommends that you continue on your current medications as directed. Please refer to the Current Medication list given to you today.  *If you need a refill on your cardiac medications before your next appointment, please call your pharmacy*   Lab Work: None ordered   If you have labs (blood work) drawn today and your tests are completely normal, you will receive your results only by: [place] (if you have MyChart) OR A paper copy in the mail If you have any lab test that is abnormal or we need to change your treatment, we will call you to review the results.   Testing/Procedures: Your physician has requested that you have an echocardiogram in 1 year . Echocardiography is a painless test that uses sound waves to create images of your heart. It provides your doctor with information about the size and shape of your heart and how well your heart's chambers and valves are working. This procedure takes approximately one hour. There are no restrictions for this procedure.    Follow-Up: At [facility], you and your  health needs are our priority.  As part of our continuing mission to provide you with exceptional heart care, we have created designated Provider Care Teams.  These Care Teams include your primary Cardiologist (physician) and Advanced Practice Providers (APPs -  Physician Assistants and Nurse Practitioners) who all work together to provide you with the care you need, when you need it.  We recommend signing up for the patient portal called "MyChart".  Sign up information is provided on this After Visit Summary.  MyChart is used to connect with patients for Virtual Visits (Telemedicine).  Patients are able to view lab/test results, encounter notes, upcoming appointments, etc.  Non-urgent messages can be sent to your provider as well.   To learn more about what you  can do with MyChart, go to [email or web address].    Your next appointment:   12 month(s)  The format for your next appointment:   In Person  Provider:   Lauree Chandler, MD     Other Instructions   Important Information About Sugar         Signed, Angelena Form, PA-C  03/12/2022 2:54 PM    [place]

## 2022-03-25 DIAGNOSIS — D696 Thrombocytopenia, unspecified: Secondary | ICD-10-CM | POA: Diagnosis not present

## 2022-04-14 DIAGNOSIS — Z85828 Personal history of other malignant neoplasm of skin: Secondary | ICD-10-CM | POA: Diagnosis not present

## 2022-04-14 DIAGNOSIS — Z08 Encounter for follow-up examination after completed treatment for malignant neoplasm: Secondary | ICD-10-CM | POA: Diagnosis not present

## 2022-05-21 ENCOUNTER — Other Ambulatory Visit: Payer: Self-pay

## 2022-05-21 MED ORDER — APIXABAN 5 MG PO TABS: ORAL_TABLET | ORAL | 1 refills | Status: DC

## 2022-05-21 NOTE — Telephone Encounter (Signed)
Prescription refill request for Eliquis received. Indication:Afib  Last office visit: 03/12/22 [place] Silos)  Scr: 0.95 (08/14/21)  Age: 76 Weight: 70.8kg  Appropriate dose and refill sent to requested pharmacy.

## 2022-08-19 ENCOUNTER — Other Ambulatory Visit: Payer: Self-pay | Admitting: Physician Assistant

## 2022-08-19 MED ORDER — AMOXICILLIN 500 MG PO TABS: 2000.0000 mg | ORAL_TABLET | ORAL | 3 refills | Status: DC

## 2022-08-20 ENCOUNTER — Other Ambulatory Visit: Payer: Self-pay

## 2022-08-20 DIAGNOSIS — I428 Other cardiomyopathies: Secondary | ICD-10-CM

## 2022-08-20 MED ORDER — CARVEDILOL 3.125 MG PO TABS: 3.1250 mg | ORAL_TABLET | Freq: Two times a day (BID) | ORAL | 2 refills | Status: DC

## 2022-08-20 MED ORDER — LOSARTAN POTASSIUM 25 MG PO TABS: ORAL_TABLET | ORAL | 2 refills | Status: DC

## 2022-08-20 NOTE — Addendum Note (Signed)
Addended by: Carter Kitten D on: 08/20/2022 11:05 AM   Modules accepted: Orders

## 2022-10-13 DIAGNOSIS — Z85828 Personal history of other malignant neoplasm of skin: Secondary | ICD-10-CM | POA: Diagnosis not present

## 2022-10-13 DIAGNOSIS — L820 Inflamed seborrheic keratosis: Secondary | ICD-10-CM | POA: Diagnosis not present

## 2022-10-13 DIAGNOSIS — Z08 Encounter for follow-up examination after completed treatment for malignant neoplasm: Secondary | ICD-10-CM | POA: Diagnosis not present

## 2022-11-19 DIAGNOSIS — I4891 Unspecified atrial fibrillation: Secondary | ICD-10-CM | POA: Diagnosis not present

## 2022-11-19 DIAGNOSIS — I2510 Atherosclerotic heart disease of native coronary artery without angina pectoris: Secondary | ICD-10-CM | POA: Diagnosis not present

## 2022-11-19 DIAGNOSIS — Z6823 Body mass index (BMI) 23.0-23.9, adult: Secondary | ICD-10-CM | POA: Diagnosis not present

## 2022-11-19 DIAGNOSIS — D649 Anemia, unspecified: Secondary | ICD-10-CM | POA: Diagnosis not present

## 2022-11-19 DIAGNOSIS — D696 Thrombocytopenia, unspecified: Secondary | ICD-10-CM | POA: Diagnosis not present

## 2022-11-19 DIAGNOSIS — I428 Other cardiomyopathies: Secondary | ICD-10-CM | POA: Diagnosis not present

## 2022-11-24 ENCOUNTER — Other Ambulatory Visit: Payer: Self-pay | Admitting: Cardiovascular Disease

## 2022-11-24 DIAGNOSIS — I4819 Other persistent atrial fibrillation: Secondary | ICD-10-CM

## 2022-11-24 NOTE — Telephone Encounter (Signed)
Prescription refill request for Eliquis received. Indication: Afib  Last office visit: 03/12/22 Janee Morn)  Scr: 0.93 (11/27/21 via [facility])  Age: 77 Weight: 70.8kg  Appropriate dose. Refill sent.

## 2022-12-16 DIAGNOSIS — Z1211 Encounter for screening for malignant neoplasm of colon: Secondary | ICD-10-CM | POA: Diagnosis not present

## 2022-12-16 DIAGNOSIS — Z1212 Encounter for screening for malignant neoplasm of rectum: Secondary | ICD-10-CM | POA: Diagnosis not present

## 2023-03-09 ENCOUNTER — Other Ambulatory Visit (HOSPITAL_COMMUNITY): Payer: Self-pay | Admitting: Internal Medicine

## 2023-03-09 ENCOUNTER — Ambulatory Visit (HOSPITAL_COMMUNITY)
Admission: RE | Admit: 2023-03-09 | Discharge: 2023-03-09 | Disposition: A | Discharge status: Home / Self Care | Payer: Medicare Other | Source: Ambulatory Visit | Attending: Internal Medicine | Admitting: Internal Medicine

## 2023-03-09 DIAGNOSIS — Z0001 Encounter for general adult medical examination with abnormal findings: Secondary | ICD-10-CM | POA: Diagnosis not present

## 2023-03-09 DIAGNOSIS — M545 Low back pain, unspecified: Secondary | ICD-10-CM | POA: Insufficient documentation

## 2023-03-09 DIAGNOSIS — M8580 Other specified disorders of bone density and structure, unspecified site: Secondary | ICD-10-CM | POA: Diagnosis not present

## 2023-03-09 DIAGNOSIS — I2510 Atherosclerotic heart disease of native coronary artery without angina pectoris: Secondary | ICD-10-CM | POA: Diagnosis not present

## 2023-03-09 DIAGNOSIS — M546 Pain in thoracic spine: Secondary | ICD-10-CM | POA: Diagnosis not present

## 2023-03-09 DIAGNOSIS — E782 Mixed hyperlipidemia: Secondary | ICD-10-CM | POA: Diagnosis not present

## 2023-03-09 DIAGNOSIS — M5126 Other intervertebral disc displacement, lumbar region: Secondary | ICD-10-CM | POA: Diagnosis not present

## 2023-03-09 DIAGNOSIS — Z1331 Encounter for screening for depression: Secondary | ICD-10-CM | POA: Diagnosis not present

## 2023-03-09 DIAGNOSIS — M549 Dorsalgia, unspecified: Secondary | ICD-10-CM | POA: Diagnosis not present

## 2023-03-09 DIAGNOSIS — E559 Vitamin D deficiency, unspecified: Secondary | ICD-10-CM | POA: Diagnosis not present

## 2023-03-09 DIAGNOSIS — Z125 Encounter for screening for malignant neoplasm of prostate: Secondary | ICD-10-CM | POA: Diagnosis not present

## 2023-03-09 DIAGNOSIS — N401 Enlarged prostate with lower urinary tract symptoms: Secondary | ICD-10-CM | POA: Diagnosis not present

## 2023-03-09 DIAGNOSIS — I428 Other cardiomyopathies: Secondary | ICD-10-CM | POA: Diagnosis not present

## 2023-03-09 DIAGNOSIS — D696 Thrombocytopenia, unspecified: Secondary | ICD-10-CM | POA: Diagnosis not present

## 2023-03-09 DIAGNOSIS — I4891 Unspecified atrial fibrillation: Secondary | ICD-10-CM | POA: Diagnosis not present

## 2023-03-09 DIAGNOSIS — Z9229 Personal history of other drug therapy: Secondary | ICD-10-CM | POA: Diagnosis not present

## 2023-03-09 DIAGNOSIS — Z6821 Body mass index (BMI) 21.0-21.9, adult: Secondary | ICD-10-CM | POA: Diagnosis not present

## 2023-03-17 ENCOUNTER — Other Ambulatory Visit (HOSPITAL_COMMUNITY): Payer: Self-pay | Admitting: Internal Medicine

## 2023-03-17 DIAGNOSIS — M8580 Other specified disorders of bone density and structure, unspecified site: Secondary | ICD-10-CM

## 2023-03-25 ENCOUNTER — Ambulatory Visit (HOSPITAL_COMMUNITY)
Admission: RE | Admit: 2023-03-25 | Discharge: 2023-03-25 | Disposition: A | Discharge status: Home / Self Care | Payer: Medicare Other | Source: Ambulatory Visit | Attending: Internal Medicine | Admitting: Internal Medicine

## 2023-03-25 DIAGNOSIS — M8580 Other specified disorders of bone density and structure, unspecified site: Secondary | ICD-10-CM | POA: Insufficient documentation

## 2023-03-25 DIAGNOSIS — M810 Age-related osteoporosis without current pathological fracture: Secondary | ICD-10-CM | POA: Diagnosis not present

## 2023-04-07 DIAGNOSIS — H25813 Combined forms of age-related cataract, bilateral: Secondary | ICD-10-CM | POA: Diagnosis not present

## 2023-04-07 DIAGNOSIS — H353131 Nonexudative age-related macular degeneration, bilateral, early dry stage: Secondary | ICD-10-CM | POA: Diagnosis not present

## 2023-04-07 DIAGNOSIS — H04123 Dry eye syndrome of bilateral lacrimal glands: Secondary | ICD-10-CM | POA: Diagnosis not present

## 2023-04-07 DIAGNOSIS — H43813 Vitreous degeneration, bilateral: Secondary | ICD-10-CM | POA: Diagnosis not present

## 2023-04-19 NOTE — Progress Notes (Unsigned)
No chief complaint on file.   History of Present Illness: 77 yo male with history of non-ischemic cardiomyopathy, former tobacco abuse, persistent atrial fibrillation, severe aortic stenosis s/p TAVR and CAD who is here today for follow up. He was followed for aortic stenosis for several years and then underwent  TAVR with a 23 mm Edwards Sapien 3 THV on 04/02/21.  Cardiac cath July 2022 with mild CAD. Echo August 2023 with LVEF=65%, moderate PVL.   He is here today for follow up. The patient denies any chest pain, dyspnea, palpitations, lower extremity edema, orthopnea, PND, dizziness, near syncope or syncope.   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 Clifton James & Dr. Laneta Simmers   Severe aortic stenosis     Past Surgical History:  Procedure Laterality Date   APPENDECTOMY     CARDIAC VALVE REPLACEMENT  04/02/2021   COLON RESECTION     COLON SURGERY  07/06/2013   COLOSTOMY REVISION N/A 07/06/2013   Procedure: COLON RESECTION SIGMOID;  Surgeon: Mariella Saa, MD;  Location: [facility]S;  Service: General;  Laterality: N/A;   FISTULOTOMY N/A 07/06/2013   Procedure: repair of colovesicle fistula/ FISTULOTOMY;  Surgeon: Mariella Saa, MD;  Location: [facility];  Service: General;  Laterality: N/A;   GUM SURGERY     HERNIA REPAIR     INGUINAL HERNIA REPAIR Left 10/10/2015   Procedure: LEFT INGUINAL HERNIA REPAIR ;  Surgeon: Manus Rudd, MD;  Location: [facility] OR;  Service: General;   Laterality: Left;   INSERTION OF MESH Left 10/10/2015   Procedure: INSERTION OF MESH;  Surgeon: Manus Rudd, MD;  Location: [facility] OR;  Service: General;  Laterality: Left;   INSERTION OF MESH Bilateral 08/20/2021   Procedure: INSERTION OF MESH;  Surgeon: Axel Filler, MD;  Location: [facility] OR;  Service: General;  Laterality: Bilateral;   RIGHT/LEFT HEART CATH AND CORONARY ANGIOGRAPHY N/A 10/09/2016   Procedure: Right/Left Heart Cath and Coronary Angiography;  Surgeon: Yvonne Kendall, MD;  Location: [facility] INVASIVE CV LAB;  Service: Cardiovascular;  Laterality: N/A;   RIGHT/LEFT HEART CATH AND CORONARY ANGIOGRAPHY N/A 02/13/2021   Procedure: RIGHT/LEFT HEART CATH AND CORONARY ANGIOGRAPHY;  Surgeon: Kathleene Hazel, MD;  Location: [facility] INVASIVE CV LAB;  Service: Cardiovascular;  Laterality: N/A;   TEE WITHOUT CARDIOVERSION N/A 04/02/2021   Procedure: TRANSESOPHAGEAL ECHOCARDIOGRAM (TEE);  Surgeon: Kathleene Hazel, MD;  Location: [facility] INVASIVE CV LAB;  Service: Open Heart Surgery;  Laterality: N/A;   TRANSCATHETER AORTIC VALVE REPLACEMENT, TRANSFEMORAL N/A 04/02/2021   Procedure: TRANSCATHETER AORTIC VALVE REPLACEMENT, TRANSFEMORAL;  Surgeon: Kathleene Hazel, MD;  Location: [facility] INVASIVE CV LAB;  Service: Open Heart Surgery;  Laterality: N/A;    Current Outpatient Medications  Medication Sig Dispense Refill   acetaminophen (TYLENOL) 500 MG tablet Take 1,000 mg by mouth every 6 (six) hours as needed for headache.     amoxicillin (AMOXIL) 500 MG tablet Take 4 tablets (  2,000 mg total) by mouth as directed. Take 4 tablets by mouth 1 hour prior to dental work 4 tablet 3   Bioflavonoid Products (ESTER-C) 500-550 MG TABS Take 1 tablet by mouth every morning.     carvedilol (COREG) 3.125 MG tablet Take 1 tablet (3.125 mg total) by mouth 2 (two) times daily. 180 tablet 2   cholecalciferol (VITAMIN D) 25 MCG (1000 UNIT) tablet Take 1,000 Units by mouth in the morning and at bedtime.     ELIQUIS  5 MG TABS tablet TAKE 1 TABLET(5 MG) BY MOUTH TWICE DAILY 180 tablet 1   fexofenadine (ALLEGRA) 180 MG tablet Take 180 mg by mouth daily as needed for allergies or rhinitis.     losartan (COZAAR) 25 MG tablet TAKE 1 TABLET(25 MG) BY MOUTH DAILY 90 tablet 2   meclizine (ANTIVERT) 25 MG tablet Take 1 tablet (25 mg total) by mouth 3 (three) times daily as needed for dizziness. 90 tablet 1   Multiple Vitamins-Minerals (PRESERVISION AREDS 2 PO) Take 1 tablet by mouth in the morning and at bedtime.     Omega-3 Fatty Acids (FISH OIL) 1000 MG CAPS Take 1,000 mg by mouth in the morning and at bedtime.     Polyvinyl Alcohol-Povidone PF (REFRESH) 1.4-0.6 % SOLN Place 1 drop into both eyes daily as needed (dry eyes).     tadalafil (CIALIS) 5 MG tablet Take 5 mg by mouth daily as needed for erectile dysfunction.     vitamin C (ASCORBIC ACID) 500 MG tablet Take 500 mg by mouth every evening.     No current facility-administered medications for this visit.    Allergies  Allergen Reactions   Dust Mite Extract Other (See Comments)    Sneezing   Pollen Extract Other (See Comments)    Sneezing    Social History   Socioeconomic History   Marital status: Significant Other    Spouse name: Not on file   Number of children: Not on file   Years of education: Not on file   Highest education level: Not on file  Occupational History   Not on file  Tobacco Use   Smoking status: Former    Current packs/day: 0.00    Average packs/day: 1.5 packs/day for 30.0 years (45.0 ttl pk-yrs)    Types: Cigarettes    Start date: 06/03/1978    Quit date: 06/03/2008    Years since quitting: 14.8   Smokeless tobacco: Never  Vaping Use   Vaping status: Never Used  Substance and Sexual Activity   Alcohol use: Not Currently    Comment: rare   Drug use: No   Sexual activity: Yes    Birth control/protection: None  Other Topics Concern   Not on file  Social History Narrative   Not on file   Social Determinants of  Health   Financial Resource Strain: Not on file  Food Insecurity: Not on file  Transportation Needs: Not on file  Physical Activity: Not on file  Stress: Not on file  Social Connections: Not on file  Intimate Partner Violence: Not on file    Family History  Problem Relation Age of Onset   Stroke Mother 13   Heart attack Father 54   Hypertension Unknown     Review of Systems:  As stated in the HPI and otherwise negative.   There were no vitals taken for this visit.  Physical Examination:  General: Well developed, well nourished, NAD  HEENT: OP clear, mucus membranes moist  SKIN: warm, dry. No rashes. Neuro: No focal deficits  Musculoskeletal: Muscle strength 5/5 all ext  Psychiatric: Mood and affect normal  Neck: No JVD, no carotid bruits, no thyromegaly, no lymphadenopathy.  Lungs:Clear bilaterally, no wheezes, rhonci, crackles Cardiovascular: Regular rate and rhythm. No murmurs, gallops or rubs. Abdomen:Soft. Bowel sounds present. Non-tender.  Extremities: No lower extremity edema. Pulses are 2 + in the bilateral DP/PT.  .EKG:  EKG is *** ordered today. The ekg ordered today demonstrates   Recent Labs: No results found for requested labs within last 365 days.   Lipid Panel    Component Value Date/Time   CHOL 202 (H) 08/13/2021 1019   CHOL 160 01/09/2020 0816   TRIG 46 08/13/2021 1019   HDL 41 08/13/2021 1019   HDL 42 01/09/2020 0816   CHOLHDL 4.9 08/13/2021 1019   VLDL 9 08/13/2021 1019   LDLCALC 152 (H) 08/13/2021 1019   LDLCALC 107 (H) 01/09/2020 0816     Wt Readings from Last 3 Encounters:  03/12/22 70.8 kg  09/05/21 70.8 kg  08/20/21 74.8 kg    Assessment and Plan:   1. Atrial fibrillation, persistent: Rate controlled atrial fib. Continue Coreg and Eliquis.    2. Non-ischemic cardiomyopathy: LV function normal by echo August 2023.   3. Severe Aortic stenosis:  he is s/p TAVR. AVR working well by echo August 2023 with PVL. Continue to use  antibiotics for SBE prophylaxis. *** ? Repeat echo  4. Hyperlipidemia: LDL ***. He has refused to consider statins, Zetia or Repatha.   5. CAD without angina: Mild CAD by cath in 2022. No chest pain. He has refused statin therapy.   Labs/ tests ordered today include:  No orders of the defined types were placed in this encounter.  Disposition:   F/U with me in 12 months  Signed, Verne Carrow, MD 04/19/2023 5:52 PM    [facility] [address] [place], [place], [place]  [address] Phone: [phone]; Fax: [phone]

## 2023-04-20 ENCOUNTER — Encounter: Payer: Self-pay | Admitting: Cardiovascular Disease

## 2023-04-20 ENCOUNTER — Other Ambulatory Visit: Payer: Self-pay | Admitting: *Deleted

## 2023-04-20 ENCOUNTER — Ambulatory Visit: Payer: Medicare Other | Attending: Cardiovascular Disease | Admitting: Cardiovascular Disease

## 2023-04-20 VITALS — BP 106/70 | HR 82 | Ht 72.0 in | Wt 162.0 lb

## 2023-04-20 DIAGNOSIS — I4819 Other persistent atrial fibrillation: Secondary | ICD-10-CM | POA: Diagnosis not present

## 2023-04-20 DIAGNOSIS — I2510 Atherosclerotic heart disease of native coronary artery without angina pectoris: Secondary | ICD-10-CM | POA: Diagnosis not present

## 2023-04-20 DIAGNOSIS — Z952 Presence of prosthetic heart valve: Secondary | ICD-10-CM | POA: Insufficient documentation

## 2023-04-20 DIAGNOSIS — E7800 Pure hypercholesterolemia, unspecified: Secondary | ICD-10-CM | POA: Diagnosis not present

## 2023-04-20 DIAGNOSIS — I428 Other cardiomyopathies: Secondary | ICD-10-CM | POA: Diagnosis not present

## 2023-04-20 MED ORDER — LOSARTAN POTASSIUM 25 MG PO TABS: ORAL_TABLET | ORAL | 3 refills | Status: DC

## 2023-04-20 MED ORDER — APIXABAN 5 MG PO TABS: 5.0000 mg | ORAL_TABLET | Freq: Two times a day (BID) | ORAL | 2 refills | Status: DC

## 2023-04-20 MED ORDER — CARVEDILOL 3.125 MG PO TABS: 3.1250 mg | ORAL_TABLET | Freq: Two times a day (BID) | ORAL | 3 refills | Status: DC

## 2023-04-20 NOTE — Patient Instructions (Signed)
Medication Instructions:  No changes *If you need a refill on your cardiac medications before your next appointment, please call your pharmacy*   Lab Work: none If you have labs (blood work) drawn today and your tests are completely normal, you will receive your results only by: MyChart Message (if you have MyChart) OR A paper copy in the mail If you have any lab test that is abnormal or we need to change your treatment, we will call you to review the results.   Testing/Procedures: please schedule at [facility] Your physician has requested that you have an echocardiogram. Echocardiography is a painless test that uses sound waves to create images of your heart. It provides your doctor with information about the size and shape of your heart and how well your heart's chambers and valves are working. This procedure takes approximately one hour. There are no restrictions for this procedure. Please do NOT wear cologne, perfume, aftershave, or lotions (deodorant is allowed). Please arrive 15 minutes prior to your appointment time.    Follow-Up: At [facility], you and your health needs are our priority.  As part of our continuing mission to provide you with exceptional heart care, we have created designated Provider Care Teams.  These Care Teams include your primary Cardiologist (physician) and Advanced Practice Providers (APPs -  Physician Assistants and Nurse Practitioners) who all work together to provide you with the care you need, when you need it.   Your next appointment:   12 month(s)  Provider:   Verne Carrow, MD

## 2023-04-20 NOTE — Telephone Encounter (Signed)
Eliquis 5mg  refill request received. Patient is 77 years old, weight-73.5kg, Crea-1.04 on 03/09/23 via [employer] from [employer], [place], and last seen by Dr. Clifton James today, 04/20/23. Dose is appropriate based on dosing criteria. Will send in refill to requested pharmacy.

## 2023-05-12 ENCOUNTER — Ambulatory Visit (HOSPITAL_COMMUNITY): Payer: Medicare Other | Attending: Cardiovascular Disease

## 2023-05-12 ENCOUNTER — Telehealth: Payer: Self-pay | Admitting: *Deleted

## 2023-05-12 DIAGNOSIS — I428 Other cardiomyopathies: Secondary | ICD-10-CM

## 2023-05-12 DIAGNOSIS — Z952 Presence of prosthetic heart valve: Secondary | ICD-10-CM

## 2023-05-12 DIAGNOSIS — I4819 Other persistent atrial fibrillation: Secondary | ICD-10-CM | POA: Diagnosis not present

## 2023-05-12 LAB — ECHOCARDIOGRAM COMPLETE
AR max vel: 1.31 cm2
AV Area VTI: 1.21 cm2
AV Area mean vel: 1.2 cm2
AV Mean grad: 15 mm[Hg]
AV Peak grad: 26.4 mm[Hg]
Ao pk vel: 2.57 m/s
P 1/2 time: 470 ms
S' Lateral: 3.8 cm

## 2023-05-12 NOTE — Telephone Encounter (Signed)
-----   Message from Verne Carrow sent at 05/12/2023  3:32 PM EDT ----- Could we see if he could come in Friday afternoon to see me in that open spot? I need to discuss his valve and the leakiness around the valve. He will need a TEE to better assess but probably best to sit down with him. Thayer Ohm

## 2023-05-13 NOTE — Telephone Encounter (Signed)
Spoke w the patient.   He is flying out of town to [place] Thursday to drive an elderly family member back to [facility] on Friday.  He is worried that waiting until the next available date of Nov 4 is too long.   He would like me to check with Dr. Clifton James and make sure he would be okay to fly and then make the drive back and then wait until 05/25/23.    We will call him or send a mychart message to let him know.

## 2023-05-14 NOTE — Telephone Encounter (Signed)
Called patient to schedule f/u. Patient decided to wait until 11/04 instead of taking 10/25.   Appointment scheduled 05/25/23 at 2:00 pm.

## 2023-05-14 NOTE — Telephone Encounter (Signed)
Called patient to let him know that Dr. Clifton James was ok with him traveling. No answer. Lvm

## 2023-05-25 ENCOUNTER — Ambulatory Visit: Payer: Medicare Other | Attending: Cardiovascular Disease | Admitting: Cardiovascular Disease

## 2023-05-25 ENCOUNTER — Encounter: Payer: Self-pay | Admitting: Cardiovascular Disease

## 2023-05-25 VITALS — BP 114/70 | HR 75 | Ht 72.0 in | Wt 164.0 lb

## 2023-05-25 DIAGNOSIS — Z952 Presence of prosthetic heart valve: Secondary | ICD-10-CM | POA: Insufficient documentation

## 2023-05-25 DIAGNOSIS — I428 Other cardiomyopathies: Secondary | ICD-10-CM | POA: Insufficient documentation

## 2023-05-25 DIAGNOSIS — I351 Nonrheumatic aortic (valve) insufficiency: Secondary | ICD-10-CM | POA: Insufficient documentation

## 2023-05-25 DIAGNOSIS — Z01812 Encounter for preprocedural laboratory examination: Secondary | ICD-10-CM | POA: Insufficient documentation

## 2023-05-25 NOTE — Progress Notes (Signed)
Chief Complaint  Patient presents with   Follow-up    S/p TAVR with aortic valve insufficiency   History of Present Illness: 77 yo male with history of non-ischemic cardiomyopathy, former tobacco abuse, persistent atrial fibrillation, severe aortic stenosis s/p TAVR and CAD who is here today for follow up. He was followed for aortic stenosis for several years and then underwent  TAVR with a 23 mm Edwards Sapien 3 THV on 04/02/21.  Cardiac cath July 2022 with mild CAD. Echo August 2023 with LVEF=65%, moderate PVL. He was seen in September 2024 and was doing well. Echo 05/12/23 with LVEF=45-50%.  Moderate to severe PVL. Severe enlargement of the RV with normal RV function. Mild MR.   He is here today for follow up. The patient denies any chest pain, dyspnea, palpitations, lower extremity edema, orthopnea, PND, dizziness, near syncope or syncope. He does have fatigue but otherwise feels well. He is very active.   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 Clifton James & Dr. Laneta Simmers   Severe aortic stenosis     Past Surgical History:  Procedure Laterality Date   APPENDECTOMY     CARDIAC VALVE REPLACEMENT  04/02/2021   COLON RESECTION     COLON SURGERY  07/06/2013   COLOSTOMY REVISION N/A 07/06/2013   Procedure: COLON RESECTION SIGMOID;  Surgeon: Mariella Saa, MD;  Location: [facility]S;  Service: General;  Laterality: N/A;   FISTULOTOMY N/A 07/06/2013   Procedure: repair of colovesicle  fistula/ FISTULOTOMY;  Surgeon: Mariella Saa, MD;  Location: [facility];  Service: General;  Laterality: N/A;   GUM SURGERY     HERNIA REPAIR     INGUINAL HERNIA REPAIR Left 10/10/2015   Procedure: LEFT INGUINAL HERNIA REPAIR ;  Surgeon: Manus Rudd, MD;  Location: [facility] OR;  Service: General;  Laterality: Left;   INSERTION OF MESH Left 10/10/2015   Procedure: INSERTION OF MESH;  Surgeon: Manus Rudd, MD;  Location: [facility] OR;  Service: General;  Laterality: Left;   INSERTION OF MESH Bilateral 08/20/2021   Procedure: INSERTION OF MESH;  Surgeon: Axel Filler, MD;  Location: [facility] OR;  Service: General;  Laterality: Bilateral;   RIGHT/LEFT HEART CATH AND CORONARY ANGIOGRAPHY N/A 10/09/2016   Procedure: Right/Left Heart Cath and Coronary Angiography;  Surgeon: Yvonne Kendall, MD;  Location: [facility] INVASIVE CV LAB;  Service: Cardiovascular;  Laterality: N/A;   RIGHT/LEFT HEART CATH AND CORONARY ANGIOGRAPHY N/A 02/13/2021   Procedure: RIGHT/LEFT HEART CATH AND CORONARY ANGIOGRAPHY;  Surgeon: Kathleene Hazel, MD;  Location: [facility] INVASIVE CV LAB;  Service: Cardiovascular;  Laterality: N/A;   TEE WITHOUT CARDIOVERSION N/A 04/02/2021   Procedure: TRANSESOPHAGEAL ECHOCARDIOGRAM (TEE);  Surgeon: Kathleene Hazel, MD;  Location: [facility] INVASIVE CV LAB;  Service: Open Heart Surgery;  Laterality: N/A;   TRANSCATHETER AORTIC VALVE REPLACEMENT, TRANSFEMORAL N/A 04/02/2021   Procedure: TRANSCATHETER AORTIC VALVE REPLACEMENT, TRANSFEMORAL;  Surgeon: Kathleene Hazel, MD;  Location:  [facility] INVASIVE CV LAB;  Service: Open Heart Surgery;  Laterality: N/A;    Current Outpatient Medications  Medication Sig Dispense Refill   acetaminophen (TYLENOL) 500 MG tablet Take 1,000 mg by mouth every 6 (six) hours as needed for headache.     alendronate (FOSAMAX) 70 MG tablet Take 70 mg by mouth once a week.     amoxicillin (AMOXIL) 500 MG tablet Take 4 tablets (2,000 mg total) by mouth as directed. Take 4 tablets by  mouth 1 hour prior to dental work 4 tablet 3   apixaban (ELIQUIS) 5 MG TABS tablet Take 1 tablet (5 mg total) by mouth 2 (two) times daily. 180 tablet 2   Bioflavonoid Products (ESTER-C) 500-550 MG TABS Take 1 tablet by mouth every morning.     carvedilol (COREG) 3.125 MG tablet Take 1 tablet (3.125 mg total) by mouth 2 (two) times daily. 180 tablet 3   cholecalciferol (VITAMIN D) 25 MCG (1000 UNIT) tablet Take 1,000 Units by mouth in the morning and at bedtime.     fexofenadine (ALLEGRA) 180 MG tablet Take 180 mg by mouth daily as needed for allergies or rhinitis.     losartan (COZAAR) 25 MG tablet TAKE 1 TABLET(25 MG) BY MOUTH DAILY 90 tablet 3   meclizine (ANTIVERT) 25 MG tablet Take 1 tablet (25 mg total) by mouth 3 (three) times daily as needed for dizziness. 90 tablet 1   Multiple Vitamins-Minerals (PRESERVISION AREDS 2 PO) Take 1 tablet by mouth in the morning and at bedtime.     Omega-3 Fatty Acids (FISH OIL) 1000 MG CAPS Take 1,000 mg by mouth in the morning and at bedtime.     tadalafil (CIALIS) 5 MG tablet Take 5 mg by mouth daily as needed for erectile dysfunction.     vitamin C (ASCORBIC ACID) 500 MG tablet Take 500 mg by mouth every evening.     No current facility-administered medications for this visit.    Allergies  Allergen Reactions   Dust Mite Extract Other (See Comments)    Sneezing   Pollen Extract Other (See Comments)    Sneezing    Social History   Socioeconomic History   Marital status: Significant Other    Spouse name: Not on file   Number of children: Not on file   Years of education: Not on file   Highest education level: Not on file  Occupational History   Not on file  Tobacco Use   Smoking status: Former    Current packs/day: 0.00    Average packs/day: 1.5 packs/day for 30.0 years (45.0 ttl pk-yrs)    Types: Cigarettes    Start date: 06/03/1978    Quit date: 06/03/2008    Years since quitting: 14.9   Smokeless tobacco: Never  Vaping Use    Vaping status: Never Used  Substance and Sexual Activity   Alcohol use: Not Currently    Comment: rare   Drug use: No   Sexual activity: Yes    Birth control/protection: None  Other Topics Concern   Not on file  Social History Narrative   Not on file   Social Determinants of Health   Financial Resource Strain: Not on file  Food Insecurity: Not on file  Transportation Needs: Not on file  Physical Activity: Not on file  Stress: Not on file  Social Connections: Not on file  Intimate Partner Violence: Not on file    Family History  Problem Relation Age of Onset   Stroke Mother  87   Heart attack Father 28   Hypertension Unknown     Review of Systems:  As stated in the HPI and otherwise negative.   BP 114/70   Pulse 75   Ht 6' (1.829 m)   Wt 74.4 kg   SpO2 96%   BMI 22.24 kg/m   Physical Examination:  General: Well developed, well nourished, NAD  HEENT: OP clear, mucus membranes moist  SKIN: warm, dry. No rashes. Neuro: No focal deficits  Musculoskeletal: Muscle strength 5/5 all ext  Psychiatric: Mood and affect normal  Neck: No JVD, no carotid bruits, no thyromegaly, no lymphadenopathy.  Lungs:Clear bilaterally, no wheezes, rhonci, crackles Cardiovascular: Regular rate and rhythm. No appreciable murmur on exam.  Abdomen:Soft. Bowel sounds present. Non-tender.  Extremities: No lower extremity edema. Pulses are 2 + in the bilateral DP/PT.  .EKG:  EKG is not ordered today. The ekg ordered today demonstrates   Echo 05/12/23:  1. There is a 23 mm Edwards Sapien 3 prosthetic (TAVR) valve present in  the aortic position. Procedure Date: 04/02/2021.      23 mm Sapien Valve. Worsening of paravalular leak arising from the  left and non cusp. This is notable worse that 03/12/22 study and best seen  both in PSAX and A5c views. Aortic regurgitation is at least moderate   2. Right ventricular systolic function is normal. The right ventricular  size is severely enlarged.  There is mildly elevated pulmonary artery  systolic pressure. The estimated right ventricular systolic pressure is  37.8 mmHg.   3. Left ventricular ejection fraction, by estimation, is 45 to 50%. Left  ventricular ejection fraction by 3D volume is 44 %. The left ventricle has  mildly decreased function. The left ventricle demonstrates regional wall  motion abnormalities (see  scoring diagram/findings for description). There is mild left ventricular  hypertrophy. Left ventricular diastolic function could not be evaluated.   4. Tricuspid valve regurgitation is moderate to severe.   5. The mitral valve is normal in structure. Mild mitral valve  regurgitation. No evidence of mitral stenosis.   6. Left atrial size was moderately dilated.   7. Right atrial size was mildly dilated.   8. The inferior vena cava is dilated in size with >50% respiratory  variability, suggesting right atrial pressure of 8 mmHg.   Comparison(s): Prior images reviewed side by side. Aortic regurgitation is  worse from prior.   Conclusion(s)/Recommendation(s): Recommend either TEE or CMR for further  characterization of regurgitation.   Recent Labs: No results found for requested labs within last 365 days.   Lipid Panel    Component Value Date/Time   CHOL 202 (H) 08/13/2021 1019   CHOL 160 01/09/2020 0816   TRIG 46 08/13/2021 1019   HDL 41 08/13/2021 1019   HDL 42 01/09/2020 0816   CHOLHDL 4.9 08/13/2021 1019   VLDL 9 08/13/2021 1019   LDLCALC 152 (H) 08/13/2021 1019   LDLCALC 107 (H) 01/09/2020 0816     Wt Readings from Last 3 Encounters:  05/25/23 74.4 kg  04/20/23 73.5 kg  03/12/22 70.8 kg    Assessment and Plan:   1. Atrial fibrillation, persistent: Atrial fib today. Rate controlled. Will continue Coreg and Eliquis.     2. Non-ischemic cardiomyopathy: LV function slightly down from prior echo. Now with moderate to severe PVL (see below)  3. Severe Aortic stenosis s/p TAVR with moderate to  severe PVL:  He is s/p TAVR. AVR with moderate to severe PVL.  I  reviewed his echo findings with Dr. Izora Ribas who suggests a TEE to better assess the degree and etiology of PVL. Will arrange a TEE at [facility] on 05/25/23 with Dr. Flora Lipps.  Risks of the procedure reviewed with the patient.  Continue to use antibiotics for SBE prophylaxis as needed.    4. Hyperlipidemia: LDL 117 in August 2024. He has refused to consider statins, Zetia or Repatha.   5. CAD without angina: Mild CAD by cath in 2022. He has no chest pain suggestive of angina. He has refused statin therapy.   Labs/ tests ordered today include:   Orders Placed This Encounter  Procedures   Basic Metabolic Panel (BMET)   CBC   Disposition:   F/U with me in 12 months  Signed, Verne Carrow, MD 05/25/2023 2:48 PM    [facility] [address] [place], [place], [place]  [address] Phone: [phone]; Fax: [phone]

## 2023-05-25 NOTE — H&P (View-Only) (Signed)
 Chief Complaint  Patient presents with   Follow-up    S/p TAVR with aortic valve insufficiency   History of Present Illness: 77 yo male with history of non-ischemic cardiomyopathy, former tobacco abuse, persistent atrial fibrillation, severe aortic stenosis s/p TAVR and CAD who is here today for follow up. He was followed for aortic stenosis for several years and then underwent  TAVR with a 23 mm Edwards Sapien 3 THV on 04/02/21.  Cardiac cath July 2022 with mild CAD. Echo August 2023 with LVEF=65%, moderate PVL. He was seen in September 2024 and was doing well. Echo 05/12/23 with LVEF=45-50%.  Moderate to severe PVL. Severe enlargement of the RV with normal RV function. Mild MR.   He is here today for follow up. The patient denies any chest pain, dyspnea, palpitations, lower extremity edema, orthopnea, PND, dizziness, near syncope or syncope. He does have fatigue but otherwise feels well. He is very active.   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 Clifton James & Dr. Laneta Simmers   Severe aortic stenosis     Past Surgical History:  Procedure Laterality Date   APPENDECTOMY     CARDIAC VALVE REPLACEMENT  04/02/2021   COLON RESECTION     COLON SURGERY  07/06/2013   COLOSTOMY REVISION N/A 07/06/2013   Procedure: COLON RESECTION SIGMOID;  Surgeon: Mariella Saa, MD;  Location: [facility] ORS;  Service: General;  Laterality: N/A;   FISTULOTOMY N/A 07/06/2013   Procedure: repair of colovesicle  fistula/ FISTULOTOMY;  Surgeon: Mariella Saa, MD;  Location: [facility];  Service: General;  Laterality: N/A;   GUM SURGERY     HERNIA REPAIR     INGUINAL HERNIA REPAIR Left 10/10/2015   Procedure: LEFT INGUINAL HERNIA REPAIR ;  Surgeon: Manus Rudd, MD;  Location: [facility] OR;  Service: General;  Laterality: Left;   INSERTION OF MESH Left 10/10/2015   Procedure: INSERTION OF MESH;  Surgeon: Manus Rudd, MD;  Location: [facility] OR;  Service: General;  Laterality: Left;   INSERTION OF MESH Bilateral 08/20/2021   Procedure: INSERTION OF MESH;  Surgeon: Axel Filler, MD;  Location: [facility] OR;  Service: General;  Laterality: Bilateral;   RIGHT/LEFT HEART CATH AND CORONARY ANGIOGRAPHY N/A 10/09/2016   Procedure: Right/Left Heart Cath and Coronary Angiography;  Surgeon: Yvonne Kendall, MD;  Location: [facility] INVASIVE CV LAB;  Service: Cardiovascular;  Laterality: N/A;   RIGHT/LEFT HEART CATH AND CORONARY ANGIOGRAPHY N/A 02/13/2021   Procedure: RIGHT/LEFT HEART CATH AND CORONARY ANGIOGRAPHY;  Surgeon: Kathleene Hazel, MD;  Location: [facility] INVASIVE CV LAB;  Service: Cardiovascular;  Laterality: N/A;   TEE WITHOUT CARDIOVERSION N/A 04/02/2021   Procedure: TRANSESOPHAGEAL ECHOCARDIOGRAM (TEE);  Surgeon: Kathleene Hazel, MD;  Location: [facility] INVASIVE CV LAB;  Service: Open Heart Surgery;  Laterality: N/A;   TRANSCATHETER AORTIC VALVE REPLACEMENT, TRANSFEMORAL N/A 04/02/2021   Procedure: TRANSCATHETER AORTIC VALVE REPLACEMENT, TRANSFEMORAL;  Surgeon: Kathleene Hazel, MD;  Location:  [facility] INVASIVE CV LAB;  Service: Open Heart Surgery;  Laterality: N/A;    Current Outpatient Medications  Medication Sig Dispense Refill   acetaminophen (TYLENOL) 500 MG tablet Take 1,000 mg by mouth every 6 (six) hours as needed for headache.     alendronate (FOSAMAX) 70 MG tablet Take 70 mg by mouth once a week.     amoxicillin (AMOXIL) 500 MG tablet Take 4 tablets (2,000 mg total) by mouth as directed. Take 4 tablets by  mouth 1 hour prior to dental work 4 tablet 3   apixaban (ELIQUIS) 5 MG TABS tablet Take 1 tablet (5 mg total) by mouth 2 (two) times daily. 180 tablet 2   Bioflavonoid Products (ESTER-C) 500-550 MG TABS Take 1 tablet by mouth every morning.     carvedilol (COREG) 3.125 MG tablet Take 1 tablet (3.125 mg total) by mouth 2 (two) times daily. 180 tablet 3   cholecalciferol (VITAMIN D) 25 MCG (1000 UNIT) tablet Take 1,000 Units by mouth in the morning and at bedtime.     fexofenadine (ALLEGRA) 180 MG tablet Take 180 mg by mouth daily as needed for allergies or rhinitis.     losartan (COZAAR) 25 MG tablet TAKE 1 TABLET(25 MG) BY MOUTH DAILY 90 tablet 3   meclizine (ANTIVERT) 25 MG tablet Take 1 tablet (25 mg total) by mouth 3 (three) times daily as needed for dizziness. 90 tablet 1   Multiple Vitamins-Minerals (PRESERVISION AREDS 2 PO) Take 1 tablet by mouth in the morning and at bedtime.     Omega-3 Fatty Acids (FISH OIL) 1000 MG CAPS Take 1,000 mg by mouth in the morning and at bedtime.     tadalafil (CIALIS) 5 MG tablet Take 5 mg by mouth daily as needed for erectile dysfunction.     vitamin C (ASCORBIC ACID) 500 MG tablet Take 500 mg by mouth every evening.     No current facility-administered medications for this visit.    Allergies  Allergen Reactions   Dust Mite Extract Other (See Comments)    Sneezing   Pollen Extract Other (See Comments)    Sneezing    Social History   Socioeconomic History   Marital status: Significant Other    Spouse name: Not on file   Number of children: Not on file   Years of education: Not on file   Highest education level: Not on file  Occupational History   Not on file  Tobacco Use   Smoking status: Former    Current packs/day: 0.00    Average packs/day: 1.5 packs/day for 30.0 years (45.0 ttl pk-yrs)    Types: Cigarettes    Start date: 06/03/1978    Quit date: 06/03/2008    Years since quitting: 14.9   Smokeless tobacco: Never  Vaping Use    Vaping status: Never Used  Substance and Sexual Activity   Alcohol use: Not Currently    Comment: rare   Drug use: No   Sexual activity: Yes    Birth control/protection: None  Other Topics Concern   Not on file  Social History Narrative   Not on file   Social Determinants of Health   Financial Resource Strain: Not on file  Food Insecurity: Not on file  Transportation Needs: Not on file  Physical Activity: Not on file  Stress: Not on file  Social Connections: Not on file  Intimate Partner Violence: Not on file    Family History  Problem Relation Age of Onset   Stroke Mother  87   Heart attack Father 28   Hypertension Unknown     Review of Systems:  As stated in the HPI and otherwise negative.   BP 114/70   Pulse 75   Ht 6' (1.829 m)   Wt 74.4 kg   SpO2 96%   BMI 22.24 kg/m   Physical Examination:  General: Well developed, well nourished, NAD  HEENT: OP clear, mucus membranes moist  SKIN: warm, dry. No rashes. Neuro: No focal deficits  Musculoskeletal: Muscle strength 5/5 all ext  Psychiatric: Mood and affect normal  Neck: No JVD, no carotid bruits, no thyromegaly, no lymphadenopathy.  Lungs:Clear bilaterally, no wheezes, rhonci, crackles Cardiovascular: Regular rate and rhythm. No appreciable murmur on exam.  Abdomen:Soft. Bowel sounds present. Non-tender.  Extremities: No lower extremity edema. Pulses are 2 + in the bilateral DP/PT.  .EKG:  EKG is not ordered today. The ekg ordered today demonstrates   Echo 05/12/23:  1. There is a 23 mm Edwards Sapien 3 prosthetic (TAVR) valve present in  the aortic position. Procedure Date: 04/02/2021.      23 mm Sapien Valve. Worsening of paravalular leak arising from the  left and non cusp. This is notable worse that 03/12/22 study and best seen  both in PSAX and A5c views. Aortic regurgitation is at least moderate   2. Right ventricular systolic function is normal. The right ventricular  size is severely enlarged.  There is mildly elevated pulmonary artery  systolic pressure. The estimated right ventricular systolic pressure is  37.8 mmHg.   3. Left ventricular ejection fraction, by estimation, is 45 to 50%. Left  ventricular ejection fraction by 3D volume is 44 %. The left ventricle has  mildly decreased function. The left ventricle demonstrates regional wall  motion abnormalities (see  scoring diagram/findings for description). There is mild left ventricular  hypertrophy. Left ventricular diastolic function could not be evaluated.   4. Tricuspid valve regurgitation is moderate to severe.   5. The mitral valve is normal in structure. Mild mitral valve  regurgitation. No evidence of mitral stenosis.   6. Left atrial size was moderately dilated.   7. Right atrial size was mildly dilated.   8. The inferior vena cava is dilated in size with >50% respiratory  variability, suggesting right atrial pressure of 8 mmHg.   Comparison(s): Prior images reviewed side by side. Aortic regurgitation is  worse from prior.   Conclusion(s)/Recommendation(s): Recommend either TEE or CMR for further  characterization of regurgitation.   Recent Labs: No results found for requested labs within last 365 days.   Lipid Panel    Component Value Date/Time   CHOL 202 (H) 08/13/2021 1019   CHOL 160 01/09/2020 0816   TRIG 46 08/13/2021 1019   HDL 41 08/13/2021 1019   HDL 42 01/09/2020 0816   CHOLHDL 4.9 08/13/2021 1019   VLDL 9 08/13/2021 1019   LDLCALC 152 (H) 08/13/2021 1019   LDLCALC 107 (H) 01/09/2020 0816     Wt Readings from Last 3 Encounters:  05/25/23 74.4 kg  04/20/23 73.5 kg  03/12/22 70.8 kg    Assessment and Plan:   1. Atrial fibrillation, persistent: Atrial fib today. Rate controlled. Will continue Coreg and Eliquis.     2. Non-ischemic cardiomyopathy: LV function slightly down from prior echo. Now with moderate to severe PVL (see below)  3. Severe Aortic stenosis s/p TAVR with moderate to  severe PVL:  He is s/p TAVR. AVR with moderate to severe PVL.  I  reviewed his echo findings with Dr. Izora Ribas who suggests a TEE to better assess the degree and etiology of PVL. Will arrange a TEE at [facility] on 05/25/23 with Dr. Flora Lipps.  Risks of the procedure reviewed with the patient.  Continue to use antibiotics for SBE prophylaxis as needed.    4. Hyperlipidemia: LDL 117 in August 2024. He has refused to consider statins, Zetia or Repatha.   5. CAD without angina: Mild CAD by cath in 2022. He has no chest pain suggestive of angina. He has refused statin therapy.   Labs/ tests ordered today include:   Orders Placed This Encounter  Procedures   Basic Metabolic Panel (BMET)   CBC   Disposition:   F/U with me in 12 months  Signed, Verne Carrow, MD 05/25/2023 2:48 PM    [facility] [address] [place], [place], [place]  [address] Phone: [phone]; Fax: [phone]

## 2023-05-25 NOTE — Patient Instructions (Addendum)
    Dear Nicholas Sutton  You are scheduled for a TEE (Transesophageal Echocardiogram) on Thursday, November 14 with Dr. Guinevere Scarlet.  Please arrive at the [facility] (Main Entrance A) at [facility]: [address] [place], [place] [address] at 10:00 AM (This time is ONE hour before your procedure to ensure your preparation). Free valet parking service is available. You will check in at ADMITTING. The support person will be asked to wait in the waiting room.  It is OK to have someone drop you off and come back when you are ready to be discharged.      DIET:  Nothing to eat or drink after midnight except a sip of water with medications (see medication instructions below)  MEDICATION INSTRUCTIONS: !!IF ANY NEW MEDICATIONS ARE STARTED AFTER TODAY, PLEASE NOTIFY YOUR PROVIDER AS SOON AS POSSIBLE!!  FYI: Medications such as Semaglutide (Ozempic, [place]), Tirzepatide (Mounjaro, Zepbound), Dulaglutide (Trulicity), etc ("GLP1 agonists") AND Canagliflozin (Invokana), Dapagliflozin (Farxiga), Empagliflozin (Jardiance), Ertugliflozin (Steglatro), Bexagliflozin [employer]) or any combination with one of these drugs such as Invokamet (Canagliflozin/Metformin), Synjardy (Empagliflozin/Metformin), etc ("SGLT2 inhibitors") must be held around the time of a procedure. This is not a comprehensive list of all of these drugs. Please review all of your medications and talk to your provider if you take any one of these. If you are not sure, ask your provider.   Continue taking your anticoagulant (blood thinner): Apixaban (Eliquis).  You will need to continue this after your procedure until you are told by your provider that it is safe to stop.    LABS:  TODAY - BMET, CBC Come to the lab at [facility] at [address] between the hours of 8:00 am and 4:30 pm. You do NOT have to be fasting.  FYI:  For your safety, and to allow [place] to monitor your vital signs accurately during the  surgery/procedure we request: If you have artificial nails, gel coating, SNS etc, please have those removed prior to your surgery/procedure. Not having the nail coverings /polish removed may result in cancellation or delay of your surgery/procedure.  You must have a responsible person to drive you home and stay in the waiting area during your procedure. Failure to do so could result in cancellation.  Bring your insurance cards.  *Special Note: Every effort is made to have your procedure done on time. Occasionally there are emergencies that occur at the hospital that may cause delays. Please be patient if a delay does occur.    Medication Instructions:  NO CHANGES *If you need a refill on your cardiac medications before your next appointment, please call your pharmacy*   Lab Work: TODAY: CBC, BMET   Testing/Procedures: Your physician has requested that you have a TEE. During a TEE, sound waves are used to create images of your heart. It provides your doctor with information about the size and shape of your heart and how well your heart's chambers and valves are working. In this test, a transducer is attached to the end of a flexible tube that's guided down your throat and into your esophagus (the tube leading from you mouth to your stomach) to get a more detailed image of your heart. You are not awake for the procedure. Please see the instruction sheet given to you today. For further information please visit [email or web address]   Follow-Up: AS PLANNED

## 2023-05-26 LAB — CBC
Hematocrit: 42.2 % (ref 37.5–51.0)
Hemoglobin: 13.9 g/dL (ref 13.0–17.7)
MCH: 30.9 pg (ref 26.6–33.0)
MCHC: 32.9 g/dL (ref 31.5–35.7)
MCV: 94 fL (ref 79–97)
Platelets: 132 10*3/uL — ABNORMAL LOW (ref 150–450)
RBC: 4.5 x10E6/uL (ref 4.14–5.80)
RDW: 13.2 % (ref 11.6–15.4)
WBC: 6.4 10*3/uL (ref 3.4–10.8)

## 2023-05-26 LAB — BASIC METABOLIC PANEL
BUN/Creatinine Ratio: 19 (ref 10–24)
BUN: 20 mg/dL (ref 8–27)
CO2: 21 mmol/L (ref 20–29)
Calcium: 9.1 mg/dL (ref 8.6–10.2)
Chloride: 100 mmol/L (ref 96–106)
Creatinine, Ser: 1.05 mg/dL (ref 0.76–1.27)
Glucose: 84 mg/dL (ref 70–99)
Potassium: 4.8 mmol/L (ref 3.5–5.2)
Sodium: 139 mmol/L (ref 134–144)
eGFR: 74 mL/min/{1.73_m2} (ref >59)

## 2023-06-03 NOTE — Progress Notes (Signed)
Unable to reach patient about procedure, but was able to leave a detailed message. Stated that the patient needed to arrive at the hospital at 0900, remain NPO after 0000, needs to have a ride home and a responsible adult to stay with them for 24 hours after the procedure. Instructed the patient to call back if they had any questions.

## 2023-06-04 ENCOUNTER — Encounter (HOSPITAL_COMMUNITY)
Admission: RE | Disposition: A | Discharge status: Home / Self Care | Payer: Self-pay | Source: Home / Self Care | Attending: Cardiovascular Disease

## 2023-06-04 ENCOUNTER — Other Ambulatory Visit: Payer: Self-pay

## 2023-06-04 ENCOUNTER — Ambulatory Visit (HOSPITAL_COMMUNITY)
Admission: RE | Admit: 2023-06-04 | Discharge: 2023-06-04 | Disposition: A | Discharge status: Home / Self Care | Payer: Medicare Other | Attending: Cardiovascular Disease | Admitting: Cardiovascular Disease

## 2023-06-04 ENCOUNTER — Encounter (HOSPITAL_COMMUNITY): Payer: Self-pay | Admitting: Cardiovascular Disease

## 2023-06-04 ENCOUNTER — Ambulatory Visit (HOSPITAL_COMMUNITY): Payer: Medicare Other | Admitting: Anesthesiology

## 2023-06-04 ENCOUNTER — Ambulatory Visit (HOSPITAL_COMMUNITY)
Admission: RE | Admit: 2023-06-04 | Discharge: 2023-06-04 | Disposition: A | Discharge status: Home / Self Care | Payer: Medicare Other | Source: Ambulatory Visit | Attending: Cardiovascular Disease | Admitting: Cardiovascular Disease

## 2023-06-04 DIAGNOSIS — Z952 Presence of prosthetic heart valve: Secondary | ICD-10-CM | POA: Diagnosis not present

## 2023-06-04 DIAGNOSIS — I351 Nonrheumatic aortic (valve) insufficiency: Secondary | ICD-10-CM

## 2023-06-04 DIAGNOSIS — I10 Essential (primary) hypertension: Secondary | ICD-10-CM | POA: Diagnosis not present

## 2023-06-04 DIAGNOSIS — I2510 Atherosclerotic heart disease of native coronary artery without angina pectoris: Secondary | ICD-10-CM | POA: Diagnosis not present

## 2023-06-04 DIAGNOSIS — Z7901 Long term (current) use of anticoagulants: Secondary | ICD-10-CM | POA: Insufficient documentation

## 2023-06-04 DIAGNOSIS — I350 Nonrheumatic aortic (valve) stenosis: Secondary | ICD-10-CM | POA: Diagnosis not present

## 2023-06-04 DIAGNOSIS — E785 Hyperlipidemia, unspecified: Secondary | ICD-10-CM | POA: Diagnosis not present

## 2023-06-04 DIAGNOSIS — I428 Other cardiomyopathies: Secondary | ICD-10-CM | POA: Diagnosis not present

## 2023-06-04 DIAGNOSIS — I4819 Other persistent atrial fibrillation: Secondary | ICD-10-CM | POA: Diagnosis not present

## 2023-06-04 DIAGNOSIS — Z87891 Personal history of nicotine dependence: Secondary | ICD-10-CM | POA: Diagnosis not present

## 2023-06-04 DIAGNOSIS — Z79899 Other long term (current) drug therapy: Secondary | ICD-10-CM | POA: Insufficient documentation

## 2023-06-04 DIAGNOSIS — I361 Nonrheumatic tricuspid (valve) insufficiency: Secondary | ICD-10-CM | POA: Diagnosis not present

## 2023-06-04 DIAGNOSIS — I340 Nonrheumatic mitral (valve) insufficiency: Secondary | ICD-10-CM

## 2023-06-04 HISTORY — PX: TRANSESOPHAGEAL ECHOCARDIOGRAM (CATH LAB): EP1270

## 2023-06-04 LAB — ECHO TEE
AR max vel: 1.57 cm2
AV Area VTI: 1.62 cm2
AV Area mean vel: 1.66 cm2
AV Mean grad: 12.3 mm[Hg]
AV Peak grad: 23.6 mm[Hg]
Ao pk vel: 2.43 m/s

## 2023-06-04 SURGERY — TRANSESOPHAGEAL ECHOCARDIOGRAM (TEE) (CATHLAB)
Anesthesia: Monitor Anesthesia Care

## 2023-06-04 MED ORDER — PHENYLEPHRINE 80 MCG/ML (10ML) SYRINGE FOR IV PUSH (FOR BLOOD PRESSURE SUPPORT)
PREFILLED_SYRINGE | INTRAVENOUS | Status: DC | PRN
  Administered 2023-06-04 (×2): 80 ug via INTRAVENOUS

## 2023-06-04 MED ORDER — SODIUM CHLORIDE 0.9 % IV SOLN: INTRAVENOUS | Status: DC | PRN

## 2023-06-04 MED ORDER — PROPOFOL 500 MG/50ML IV EMUL
INTRAVENOUS | Status: DC | PRN
  Administered 2023-06-04: 150 ug/kg/min via INTRAVENOUS

## 2023-06-04 MED ORDER — PROPOFOL 10 MG/ML IV BOLUS
INTRAVENOUS | Status: DC | PRN
  Administered 2023-06-04: 60 mg via INTRAVENOUS

## 2023-06-04 MED ORDER — LIDOCAINE 2% (20 MG/ML) 5 ML SYRINGE
INTRAMUSCULAR | Status: DC | PRN
  Administered 2023-06-04: 100 mg via INTRAVENOUS

## 2023-06-04 NOTE — Anesthesia Postprocedure Evaluation (Signed)
Anesthesia Post Note  Patient: Nicholas Sutton  Procedure(s) Performed: TRANSESOPHAGEAL ECHOCARDIOGRAM     Patient location during evaluation: Cath Lab Anesthesia Type: MAC Level of consciousness: awake and alert, patient cooperative and oriented Pain management: pain level controlled Vital Signs Assessment: post-procedure vital signs reviewed and stable Respiratory status: nonlabored ventilation, spontaneous breathing and respiratory function stable Cardiovascular status: blood pressure returned to baseline and stable Postop Assessment: no apparent nausea or vomiting and able to ambulate Anesthetic complications: no   No notable events documented.  Last Vitals:  Vitals:   06/04/23 1115 06/04/23 1120  BP: (!) 97/53 120/76  Pulse: 78 76  Resp: 19 16  Temp:    SpO2: 97% 97%    Last Pain:  Vitals:   06/04/23 1114  TempSrc: Temporal  PainSc: 0-No pain                 Maddyx Wieck,E. Lauryl Seyer

## 2023-06-04 NOTE — Interval H&P Note (Signed)
History and Physical Interval Note:  06/04/2023 9:35 AM  Nicholas Sutton  has presented today for surgery, with the diagnosis of aortic valve regurgitation.  The various methods of treatment have been discussed with the patient and family. After consideration of risks, benefits and other options for treatment, the patient has consented to  Procedure(s): TRANSESOPHAGEAL ECHOCARDIOGRAM (N/A) as a surgical intervention.  The patient's history has been reviewed, patient examined, no change in status, stable for surgery.  I have reviewed the patient's chart and labs.  Questions were answered to the patient's satisfaction.    NPO for TEE. Severe AI.   Gerri Spore T. Flora Lipps, MD, [facility]  [facility]  [address] [place], [place] [address] [phone]  9:36 AM

## 2023-06-04 NOTE — Progress Notes (Signed)
  Echocardiogram Echocardiogram Transesophageal has been performed.  Janalyn Harder 06/04/2023, 12:42 PM

## 2023-06-04 NOTE — Transfer of Care (Signed)
Immediate Anesthesia Transfer of Care Note  Patient: Nicholas Sutton  Procedure(s) Performed: TRANSESOPHAGEAL ECHOCARDIOGRAM  Patient Location: PACU  Anesthesia Type:MAC  Level of Consciousness: drowsy  Airway & Oxygen Therapy: Patient Spontanous Breathing and Patient connected to nasal cannula oxygen  Post-op Assessment: Report given to RN and Post -op Vital signs reviewed and stable  Post vital signs: Reviewed and stable  Last Vitals:  Vitals Value Taken Time  BP    Temp    Pulse    Resp    SpO2      Last Pain:  Vitals:   06/04/23 0928  TempSrc:   PainSc: 0-No pain         Complications: No notable events documented.

## 2023-06-04 NOTE — CV Procedure (Signed)
    TRANSESOPHAGEAL ECHOCARDIOGRAM   NAME:  Nicholas Sutton    MRN: [id number] DOB:  1946-04-13    ADMIT DATE: 06/04/2023  INDICATIONS: PVL  PROCEDURE:   Informed consent was obtained prior to the procedure. The risks, benefits and alternatives for the procedure were discussed and the patient comprehended these risks.  Risks include, but are not limited to, cough, sore throat, vomiting, nausea, somnolence, esophageal and stomach trauma or perforation, bleeding, low blood pressure, aspiration, pneumonia, infection, trauma to the teeth and death.    Procedural time out performed. The oropharynx was anesthetized with topical 1% benzocaine.    Anesthesia was administered by Dr. Jean Rosenthal.  The patient was administered 270 mg of propofol and 100 mg of lidocaine to achieve and maintain moderate conscious sedation.  The patient's heart rate, blood pressure, and oxygen saturation are monitored continuously during the procedure. The period of conscious sedation is 17 minutes, of which I was present face-to-face 100% of this time.   The transesophageal probe was inserted in the esophagus and stomach without difficulty and multiple views were obtained.   COMPLICATIONS:    There were no immediate complications.  KEY FINDINGS:  Moderate PVL.  Normal 23 mm S3 TAVR.  Normal LV function.  Full report to follow. Further management per primary team.   Gerri Spore T. Flora Lipps, MD, [facility]  [facility]  [address] [place], [place] [address] [phone]  11:04 AM

## 2023-06-04 NOTE — Discharge Instructions (Signed)
TEE  YOU HAD AN CARDIAC PROCEDURE TODAY: Refer to the procedure report and other information in the discharge instructions given to you for any specific questions about what was found during the examination. If this information does not answer your questions, please call [facility] office at [phone] to clarify.   DIET: Your first meal following the procedure should be a light meal and then it is ok to progress to your normal diet. A half-sandwich or bowl of soup is an example of a good first meal. Heavy or fried foods are harder to digest and may make you feel nauseous or bloated. Drink plenty of fluids but you should avoid alcoholic beverages for 24 hours. If you had a esophageal dilation, please see attached instructions for diet.   ACTIVITY: Your care partner should take you home directly after the procedure. You should plan to take it easy, moving slowly for the rest of the day. You can resume normal activity the day after the procedure however YOU SHOULD NOT DRIVE, use power tools, machinery or perform tasks that involve climbing or major physical exertion for 24 hours (because of the sedation medicines used during the test).   SYMPTOMS TO REPORT IMMEDIATELY: A cardiologist can be reached at any hour. Please call [phone] for any of the following symptoms:  Vomiting of blood or coffee ground material  New, significant abdominal pain  New, significant chest pain or pain under the shoulder blades  Painful or persistently difficult swallowing  New shortness of breath  Black, tarry-looking or red, bloody stools  FOLLOW UP:  Please also call with any specific questions about appointments or follow up tests.   

## 2023-06-04 NOTE — Anesthesia Preprocedure Evaluation (Addendum)
Anesthesia Evaluation  Patient identified by MRN, date of birth, ID band Patient awake    Reviewed: Allergy & Precautions, NPO status , Patient's Chart, lab work & pertinent test results  History of Anesthesia Complications Negative for: history of anesthetic complications  Airway Mallampati: I  TM Distance: >3 FB Neck ROM: Full    Dental  (+) Dental Advisory Given   Pulmonary former smoker   breath sounds clear to auscultation       Cardiovascular hypertension, Pt. on medications (-) angina + dysrhythmias Atrial Fibrillation + Valvular Problems/Murmurs (S/p TAVR) AI  Rhythm:Irregular Rate:Normal  04/2023 ECHO:  1. There is a 23 mm Edwards Sapien 3 prosthetic (TAVR) valve present in  the aortic position. Procedure Date: 04/02/2021. 23 mm Sapien Valve. Worsening of paravalular leak arising from the left and non cusp. This is notable worse that 03/12/22 study and best seen both in PSAX and A5c views. Aortic regurgitation is at least moderate   2. Right ventricular systolic function is normal. The right ventricular size is severely enlarged. There is mildly elevated pulmonary artery systolic pressure. The estimated right ventricular systolic pressure is 37.8 mmHg.   3. EF 45 to 50%, by 3D volume is 44 %. The left ventricle has mildly decreased function. The left ventricle demonstrates regional wall motion abnormalities (see  scoring diagram/findings for description). There is mild left ventricular  hypertrophy. Left ventricular diastolic function could not be evaluated.   4. Tricuspid valve regurgitation is moderate to severe.   5. The mitral valve is normal in structure. Mild mitral valve regurgitation. No evidence of mitral stenosis.     Neuro/Psych   Anxiety     negative neurological ROS     GI/Hepatic Neg liver ROS,,,H/o diverticulitis with complex abd surgery   Endo/Other  negative endocrine ROS    Renal/GU negative Renal ROS      Musculoskeletal   Abdominal   Peds  Hematology eliquis   Anesthesia Other Findings   Reproductive/Obstetrics                             Anesthesia Physical Anesthesia Plan  ASA: 3  Anesthesia Plan: MAC   Post-op Pain Management: Minimal or no pain anticipated   Induction:   PONV Risk Score and Plan: 1 and Treatment may vary due to age or medical condition  Airway Management Planned: Natural Airway and Nasal Cannula  Additional Equipment: None  Intra-op Plan:   Post-operative Plan:   Informed Consent: I have reviewed the patients History and Physical, chart, labs and discussed the procedure including the risks, benefits and alternatives for the proposed anesthesia with the patient or authorized representative who has indicated his/her understanding and acceptance.     Dental advisory given  Plan Discussed with: CRNA and Surgeon  Anesthesia Plan Comments:        Anesthesia Quick Evaluation

## 2023-06-05 ENCOUNTER — Ambulatory Visit: Payer: Medicare Other

## 2023-09-03 ENCOUNTER — Telehealth: Payer: Self-pay | Admitting: Cardiovascular Disease

## 2023-09-03 ENCOUNTER — Encounter: Payer: Self-pay | Admitting: Cardiovascular Disease

## 2023-09-03 DIAGNOSIS — I714 Abdominal aortic aneurysm, without rupture, unspecified: Secondary | ICD-10-CM

## 2023-09-03 NOTE — Telephone Encounter (Signed)
Pt called in asking if he has ever been screened for an abdominal aneurysm and if not does he need to. Please advise.

## 2023-09-04 NOTE — Telephone Encounter (Signed)
Sent to patient from Dr. Clifton James: Mr. Nicholas Sutton. We got a good look at your aorta in 2022 before your valve procedure. 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 We can arrange an u/s now to follow it. Chris   AAA duplex order placed.

## 2023-09-07 ENCOUNTER — Other Ambulatory Visit: Payer: Self-pay | Admitting: *Deleted

## 2023-10-08 ENCOUNTER — Ambulatory Visit (HOSPITAL_COMMUNITY)
Admission: RE | Admit: 2023-10-08 | Discharge: 2023-10-08 | Disposition: A | Discharge status: Home / Self Care | Payer: Medicare Other | Source: Ambulatory Visit | Attending: Cardiovascular Disease | Admitting: Cardiovascular Disease

## 2023-10-08 DIAGNOSIS — I7143 Infrarenal abdominal aortic aneurysm, without rupture: Secondary | ICD-10-CM | POA: Insufficient documentation

## 2023-10-08 DIAGNOSIS — I714 Abdominal aortic aneurysm, without rupture, unspecified: Secondary | ICD-10-CM | POA: Insufficient documentation

## 2023-10-09 ENCOUNTER — Other Ambulatory Visit: Payer: Self-pay | Admitting: *Deleted

## 2023-10-09 DIAGNOSIS — I714 Abdominal aortic aneurysm, without rupture, unspecified: Secondary | ICD-10-CM

## 2023-10-09 NOTE — Progress Notes (Signed)
 Order placed for AAA [place] for next March per Dr. Clifton James

## 2023-10-14 ENCOUNTER — Telehealth: Payer: Self-pay | Admitting: Cardiovascular Disease

## 2023-10-14 NOTE — Telephone Encounter (Signed)
 Spoke with pt. Told him we would contact him with the results as soon as the provider reviewed them.

## 2023-10-14 NOTE — Telephone Encounter (Signed)
 Pt calling in regard to results for his lab that was done last week. Please advise

## 2023-10-15 DIAGNOSIS — Z08 Encounter for follow-up examination after completed treatment for malignant neoplasm: Secondary | ICD-10-CM | POA: Diagnosis not present

## 2023-10-15 DIAGNOSIS — Z85828 Personal history of other malignant neoplasm of skin: Secondary | ICD-10-CM | POA: Diagnosis not present

## 2023-10-21 ENCOUNTER — Telehealth: Payer: Self-pay | Admitting: Cardiovascular Disease

## 2023-10-21 NOTE — Telephone Encounter (Signed)
 Pt calling for AAA Dup results, did not rec via mychart. Requesting to have it resent as well as a cb.

## 2023-10-21 NOTE — Telephone Encounter (Signed)
 Left voicemail to return call to office

## 2023-10-22 NOTE — Telephone Encounter (Signed)
 Per MyChart message 10/09/23, patient has seen the AAA results and has no questions/concerns.

## 2024-01-25 ENCOUNTER — Telehealth: Payer: Self-pay | Admitting: Cardiovascular Disease

## 2024-01-25 MED ORDER — AMOXICILLIN 500 MG PO TABS: 2000.0000 mg | ORAL_TABLET | ORAL | 2 refills | Status: AC

## 2024-01-25 NOTE — Telephone Encounter (Signed)
 Pt of Dr. Verlin. Please advise on this RX.

## 2024-01-25 NOTE — Telephone Encounter (Signed)
*  STAT* If patient is at the pharmacy, call can be transferred to refill team.   1. Which medications need to be refilled? (please list name of each medication and dose if known)   amoxicillin  (AMOXIL ) 500 MG tablet  2. Which pharmacy/location (including street and city if local pharmacy) is medication to be sent to? [facility] [phone] - [place], [place] - [address] AT [facility] [address]   3. Do they need a 30 day or 90 day supply? 90

## 2024-01-25 NOTE — Telephone Encounter (Signed)
 Amox refilled

## 2024-02-17 ENCOUNTER — Other Ambulatory Visit: Payer: Self-pay

## 2024-02-17 DIAGNOSIS — I4819 Other persistent atrial fibrillation: Secondary | ICD-10-CM

## 2024-02-18 MED ORDER — APIXABAN 5 MG PO TABS: 5.0000 mg | ORAL_TABLET | Freq: Two times a day (BID) | ORAL | 1 refills | Status: DC

## 2024-02-18 NOTE — Telephone Encounter (Signed)
 Prescription refill request for Eliquis  received. Indication: a fib Last office visit: 05/25/23 Scr: 1.05 epic 05/25/23 Age: 78 Weight: 72kg

## 2024-03-01 ENCOUNTER — Telehealth: Payer: Self-pay | Admitting: Family Medicine

## 2024-03-01 NOTE — Telephone Encounter (Unsigned)
 Copied from CRM [id number]. Topic: Appointments - Scheduling Inquiry for Clinic >> Mar 01, 2024  2:44 PM Tiffini S wrote: Reason for CRM:  Patient called wanting to do a transfer of care from [place] with  Bertell Satterfield, MD. He has received a letter of recommendation for [facility] and would like to talk with clinic about becoming a patient in the future with Jayce Cook. Please call at [phone]. Patient did not want to schedule with provide that is accepting new patients.

## 2024-03-15 DIAGNOSIS — Z125 Encounter for screening for malignant neoplasm of prostate: Secondary | ICD-10-CM | POA: Diagnosis not present

## 2024-03-15 DIAGNOSIS — E785 Hyperlipidemia, unspecified: Secondary | ICD-10-CM | POA: Diagnosis not present

## 2024-03-15 DIAGNOSIS — I428 Other cardiomyopathies: Secondary | ICD-10-CM | POA: Diagnosis not present

## 2024-03-15 DIAGNOSIS — D696 Thrombocytopenia, unspecified: Secondary | ICD-10-CM | POA: Diagnosis not present

## 2024-03-15 DIAGNOSIS — Z0001 Encounter for general adult medical examination with abnormal findings: Secondary | ICD-10-CM | POA: Diagnosis not present

## 2024-03-15 DIAGNOSIS — B356 Tinea cruris: Secondary | ICD-10-CM | POA: Diagnosis not present

## 2024-03-15 DIAGNOSIS — I4891 Unspecified atrial fibrillation: Secondary | ICD-10-CM | POA: Diagnosis not present

## 2024-03-15 DIAGNOSIS — Z1331 Encounter for screening for depression: Secondary | ICD-10-CM | POA: Diagnosis not present

## 2024-03-15 DIAGNOSIS — Z6822 Body mass index (BMI) 22.0-22.9, adult: Secondary | ICD-10-CM | POA: Diagnosis not present

## 2024-03-15 DIAGNOSIS — Z9229 Personal history of other drug therapy: Secondary | ICD-10-CM | POA: Diagnosis not present

## 2024-03-15 DIAGNOSIS — E039 Hypothyroidism, unspecified: Secondary | ICD-10-CM | POA: Diagnosis not present

## 2024-03-15 DIAGNOSIS — E559 Vitamin D deficiency, unspecified: Secondary | ICD-10-CM | POA: Diagnosis not present

## 2024-03-15 DIAGNOSIS — N401 Enlarged prostate with lower urinary tract symptoms: Secondary | ICD-10-CM | POA: Diagnosis not present

## 2024-04-06 DIAGNOSIS — I428 Other cardiomyopathies: Secondary | ICD-10-CM | POA: Diagnosis not present

## 2024-04-06 DIAGNOSIS — M1990 Unspecified osteoarthritis, unspecified site: Secondary | ICD-10-CM | POA: Diagnosis not present

## 2024-04-06 DIAGNOSIS — Z952 Presence of prosthetic heart valve: Secondary | ICD-10-CM | POA: Diagnosis not present

## 2024-04-06 DIAGNOSIS — D696 Thrombocytopenia, unspecified: Secondary | ICD-10-CM | POA: Diagnosis not present

## 2024-04-06 DIAGNOSIS — I4891 Unspecified atrial fibrillation: Secondary | ICD-10-CM | POA: Diagnosis not present

## 2024-04-06 DIAGNOSIS — H3530 Unspecified macular degeneration: Secondary | ICD-10-CM | POA: Diagnosis not present

## 2024-04-06 DIAGNOSIS — M549 Dorsalgia, unspecified: Secondary | ICD-10-CM | POA: Diagnosis not present

## 2024-04-06 DIAGNOSIS — E785 Hyperlipidemia, unspecified: Secondary | ICD-10-CM | POA: Diagnosis not present

## 2024-04-06 DIAGNOSIS — Z713 Dietary counseling and surveillance: Secondary | ICD-10-CM | POA: Diagnosis not present

## 2024-04-06 DIAGNOSIS — M810 Age-related osteoporosis without current pathological fracture: Secondary | ICD-10-CM | POA: Diagnosis not present

## 2024-05-05 DIAGNOSIS — M545 Low back pain, unspecified: Secondary | ICD-10-CM | POA: Diagnosis not present

## 2024-05-17 DIAGNOSIS — M545 Low back pain, unspecified: Secondary | ICD-10-CM | POA: Diagnosis not present

## 2024-05-23 ENCOUNTER — Other Ambulatory Visit: Payer: Self-pay | Admitting: Cardiovascular Disease

## 2024-05-24 ENCOUNTER — Other Ambulatory Visit: Payer: Self-pay | Admitting: Cardiovascular Disease

## 2024-05-24 DIAGNOSIS — M545 Low back pain, unspecified: Secondary | ICD-10-CM | POA: Diagnosis not present

## 2024-05-24 DIAGNOSIS — I428 Other cardiomyopathies: Secondary | ICD-10-CM

## 2024-05-25 ENCOUNTER — Telehealth: Payer: Self-pay | Admitting: Cardiovascular Disease

## 2024-05-25 ENCOUNTER — Other Ambulatory Visit: Payer: Self-pay | Admitting: Cardiovascular Disease

## 2024-05-25 DIAGNOSIS — I428 Other cardiomyopathies: Secondary | ICD-10-CM

## 2024-05-25 MED ORDER — LOSARTAN POTASSIUM 25 MG PO TABS: ORAL_TABLET | ORAL | 0 refills | Status: DC

## 2024-05-25 MED ORDER — CARVEDILOL 3.125 MG PO TABS: 3.1250 mg | ORAL_TABLET | Freq: Two times a day (BID) | ORAL | 0 refills | Status: AC

## 2024-05-25 MED ORDER — CARVEDILOL 3.125 MG PO TABS: 3.1250 mg | ORAL_TABLET | Freq: Two times a day (BID) | ORAL | 0 refills | Status: DC

## 2024-05-25 NOTE — Telephone Encounter (Signed)
 Pt's medications were sent to pt's pharmacy as requested. Confirmation received.

## 2024-05-25 NOTE — Telephone Encounter (Signed)
*  STAT* If patient is at the pharmacy, call can be transferred to refill team.   1. Which medications need to be refilled? (please list name of each medication and dose if known) carvedilol  (COREG ) 3.125 MG tablet   losartan  (COZAAR ) 25 MG tablet   2. Which pharmacy/location (including street and city if local pharmacy) is medication to be sent to?  [facility] [phone] - [place], [place] - [address] AT [facility] OF [address]      3. Do they need a 30 day or 90 day supply? 90 day

## 2024-06-07 DIAGNOSIS — M545 Low back pain, unspecified: Secondary | ICD-10-CM | POA: Diagnosis not present

## 2024-06-14 DIAGNOSIS — M545 Low back pain, unspecified: Secondary | ICD-10-CM | POA: Diagnosis not present

## 2024-06-14 DIAGNOSIS — H04123 Dry eye syndrome of bilateral lacrimal glands: Secondary | ICD-10-CM | POA: Diagnosis not present

## 2024-06-14 DIAGNOSIS — H25813 Combined forms of age-related cataract, bilateral: Secondary | ICD-10-CM | POA: Diagnosis not present

## 2024-06-14 DIAGNOSIS — H43813 Vitreous degeneration, bilateral: Secondary | ICD-10-CM | POA: Diagnosis not present

## 2024-06-14 DIAGNOSIS — H353131 Nonexudative age-related macular degeneration, bilateral, early dry stage: Secondary | ICD-10-CM | POA: Diagnosis not present

## 2024-06-21 DIAGNOSIS — M545 Low back pain, unspecified: Secondary | ICD-10-CM | POA: Diagnosis not present

## 2024-06-22 NOTE — Progress Notes (Unsigned)
 No chief complaint on file.  History of Present Illness: 78 yo male with history of non-ischemic cardiomyopathy, former tobacco abuse, persistent atrial fibrillation, severe aortic stenosis s/p TAVR and CAD who is here today for follow up. He was followed for aortic stenosis for several years and then underwent  TAVR with a 23 mm Edwards Sapien 3 valve in September 2022. Cardiac cath July 2022 with mild CAD. Echo August 2023 with LVEF=65%, moderate PVL. He was seen in September 2024 and was doing well. Echo 05/12/23 with LVEF=45-50%.  Moderate to severe PVL. Severe enlargement of the RV with normal RV function. Mild MR. TEE November 2024 with mild to moderate PVL. LV function. Mildly reduced RV function. Mild MR.   He is here today for follow up. The patient denies any chest pain, dyspnea, palpitations, lower extremity edema, orthopnea, PND, dizziness, near syncope or syncope.   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 Verlin & Dr. Lucas   Severe aortic stenosis     Past Surgical History:  Procedure Laterality Date   APPENDECTOMY     CARDIAC VALVE REPLACEMENT  04/02/2021   COLON RESECTION     COLON SURGERY  07/06/2013   COLOSTOMY REVISION N/A 07/06/2013   Procedure: COLON RESECTION SIGMOID;  Surgeon: Morene ONEIDA Mikell, MD;  Location: [facility];  Service: General;  Laterality: N/A;   FISTULOTOMY N/A 07/06/2013   Procedure: repair of colovesicle fistula/ FISTULOTOMY;  Surgeon:  Morene ONEIDA Mikell, MD;  Location: [facility];  Service: General;  Laterality: N/A;   GUM SURGERY     HERNIA REPAIR     INGUINAL HERNIA REPAIR Left 10/10/2015   Procedure: LEFT INGUINAL HERNIA REPAIR ;  Surgeon: Donnice Lima, MD;  Location: [facility] OR;  Service: General;  Laterality: Left;   INSERTION OF MESH Left 10/10/2015   Procedure: INSERTION OF MESH;  Surgeon: Donnice Lima, MD;  Location: [facility] OR;  Service: General;  Laterality: Left;   INSERTION OF MESH Bilateral 08/20/2021   Procedure: INSERTION OF MESH;  Surgeon: Rubin Calamity, MD;  Location: [facility] OR;  Service: General;  Laterality: Bilateral;   RIGHT/LEFT HEART CATH AND CORONARY ANGIOGRAPHY N/A 10/09/2016   Procedure: Right/Left Heart Cath and Coronary Angiography;  Surgeon: Lonni Hanson, MD;  Location: [facility] INVASIVE CV LAB;  Service: Cardiovascular;  Laterality: N/A;   RIGHT/LEFT HEART CATH AND CORONARY ANGIOGRAPHY N/A 02/13/2021   Procedure: RIGHT/LEFT HEART CATH AND CORONARY ANGIOGRAPHY;  Surgeon: Verlin Lonni BIRCH, MD;  Location: [facility] INVASIVE CV LAB;  Service: Cardiovascular;  Laterality: N/A;   TEE WITHOUT CARDIOVERSION N/A 04/02/2021   Procedure: TRANSESOPHAGEAL ECHOCARDIOGRAM (TEE);  Surgeon: Verlin Lonni BIRCH, MD;  Location[facility] INVASIVE CV LAB;  Service: Open Heart Surgery;  Laterality: N/A;   TRANSCATHETER AORTIC VALVE REPLACEMENT, TRANSFEMORAL N/A 04/02/2021   Procedure: TRANSCATHETER AORTIC VALVE REPLACEMENT, TRANSFEMORAL;  Surgeon: Verlin Lonni BIRCH, MD;  Location: [facility] INVASIVE CV LAB;  Service: Open Heart Surgery;  Laterality: N/A;   TRANSESOPHAGEAL ECHOCARDIOGRAM (CATH LAB) N/A 06/04/2023   Procedure: TRANSESOPHAGEAL ECHOCARDIOGRAM;  Surgeon: Barbaraann Darryle Ned, MD;  Location: [facility] INVASIVE CV LAB;  Service: Cardiovascular;  Laterality: N/A;    Current Outpatient Medications  Medication Sig Dispense Refill   acetaminophen  (TYLENOL ) 500 MG tablet Take 325-500 mg by mouth every 6 (six) hours as needed for  headache.     alendronate (FOSAMAX) 70 MG tablet Take 70 mg by mouth once a week.     amoxicillin  (AMOXIL ) 500 MG tablet Take 4 tablets (2,000 mg total) by mouth as directed. Take 4 tablets by mouth 1 hour prior to any dental procedures including cleanings. 12 tablet 2   apixaban  (ELIQUIS ) 5 MG TABS tablet Take 1 tablet (5 mg total) by mouth 2 (two) times daily. 180 tablet 1   Bioflavonoid Products (ESTER-C) 500-550 MG TABS Take 1 tablet by mouth every morning.     Calcium  Carbonate-Vit D-Min (CALCIUM  1200 PO) Take 1,200 mg by mouth daily.     carvedilol  (COREG ) 3.125 MG tablet TAKE 1 TABLET(3.125 MG) BY MOUTH TWICE DAILY 180 tablet 0   carvedilol  (COREG ) 3.125 MG tablet Take 1 tablet (3.125 mg total) by mouth 2 (two) times daily. 60 tablet 0   cholecalciferol (VITAMIN D) 25 MCG (1000 UNIT) tablet Take 1,000 Units by mouth in the morning and at bedtime.     fexofenadine (ALLEGRA) 180 MG tablet Take 180 mg by mouth daily as needed for allergies or rhinitis.     losartan  (COZAAR ) 25 MG tablet TAKE 1 TABLET(25 MG) BY MOUTH DAILY 90 tablet 0   meclizine  (ANTIVERT ) 25 MG tablet Take 1 tablet (25 mg total) by mouth 3 (three) times daily as needed for dizziness. (Patient not taking: Reported on 05/29/2023) 90 tablet 1   Multiple Vitamins-Minerals (PRESERVISION AREDS 2 PO) Take 1 tablet by mouth in the morning and at bedtime.     Omega-3 Fatty Acids (FISH OIL) 1000 MG CAPS Take 1,000 mg by mouth in the morning and at bedtime.     tadalafil (CIALIS) 5 MG tablet Take 5 mg by mouth daily as needed for erectile dysfunction.     vitamin C (ASCORBIC ACID) 500 MG tablet Take 500 mg by mouth every evening.     No current facility-administered medications for this visit.    Allergies  Allergen Reactions   Dust Mite Extract Other (See Comments)    Sneezing   Pollen Extract Other (See Comments)    Sneezing    Social History   Socioeconomic History   Marital status: Significant Other    Spouse name: Not  on file   Number of children: Not on file   Years of education: Not on file   Highest education level: Not on file  Occupational History   Not on file  Tobacco Use   Smoking status: Former    Current packs/day: 0.00    Average packs/day: 1.5 packs/day for 30.0 years (45.0 ttl pk-yrs)    Types: Cigarettes    Start date: 06/03/1978    Quit date: 06/03/2008    Years since quitting: 16.0   Smokeless tobacco: Never  Vaping Use   Vaping status: Never Used  Substance and Sexual Activity   Alcohol use: Not Currently    Comment: rare   Drug use: No   Sexual activity: Yes    Birth control/protection: None  Other Topics Concern   Not on file  Social History Narrative   Not on file  Social Drivers of Corporate Investment Banker Strain: Not on file  Food Insecurity: Not on file  Transportation Needs: Not on file  Physical Activity: Not on file  Stress: Not on file  Social Connections: Not on file  Intimate Partner Violence: Not on file    Family History  Problem Relation Age of Onset   Stroke Mother 92   Heart attack Father 21   Hypertension Unknown     Review of Systems:  As stated in the HPI and otherwise negative.   There were no vitals taken for this visit.  Physical Examination: General: Well developed, well nourished, NAD  HEENT: OP clear, mucus membranes moist  SKIN: warm, dry. No rashes. Neuro: No focal deficits  Musculoskeletal: Muscle strength 5/5 all ext  Psychiatric: Mood and affect normal  Neck: No JVD, no carotid bruits, no thyromegaly, no lymphadenopathy.  Lungs:Clear bilaterally, no wheezes, rhonci, crackles Cardiovascular: Regular rate and rhythm. *** murmur.  Abdomen:Soft. Bowel sounds present. Non-tender.  Extremities: No lower extremity edema.  SABRA .EKG:  EKG is *** ordered today. The ekg ordered today demonstrates   Recent Labs: No results found for requested labs within last 365 days.   Lipid Panel    Component Value Date/Time   CHOL 202  (H) 08/13/2021 1019   CHOL 160 01/09/2020 0816   TRIG 46 08/13/2021 1019   HDL 41 08/13/2021 1019   HDL 42 01/09/2020 0816   CHOLHDL 4.9 08/13/2021 1019   VLDL 9 08/13/2021 1019   LDLCALC 152 (H) 08/13/2021 1019   LDLCALC 107 (H) 01/09/2020 0816     Wt Readings from Last 3 Encounters:  06/04/23 160 lb (72.6 kg)  05/25/23 164 lb (74.4 kg)  04/20/23 162 lb (73.5 kg)    Assessment and Plan:   1. Atrial fibrillation, persistent: He is in atrial fibrillation today. Rate is controlled. Continue Coreg  and Eliquis . *** EP referral to discuss restoration of sinus rhythm.      2. Non-ischemic cardiomyopathy: LV function normal by TEE in November 2024. Continue Coreg .   3. Severe Aortic stenosis s/p TAVR with moderate PVL:  He is s/p TAVR. AVR with moderate PVL by TEE in November 2024. Continue to use antibiotics for SBE prophylaxis as needed.    4. Hyperlipidemia: LDL ***. He has refused to consider statins, Zetia or Repatha.   5. CAD without angina: Mild CAD by cath in 2022. No chest pain. He has refused statin therapy. He is not on ASA since he is on Eliquis .   Labs/ tests ordered today include:  No orders of the defined types were placed in this encounter.  Disposition:   F/U with me in 12 months  Signed, Lonni Cash, MD 06/22/2024 3:36 PM    [facility] [address] [place], [place], [place]  [address] Phone: [phone]; Fax: [phone]

## 2024-06-23 ENCOUNTER — Encounter: Payer: Self-pay | Admitting: Cardiovascular Disease

## 2024-06-23 ENCOUNTER — Ambulatory Visit: Attending: Cardiovascular Disease | Admitting: Cardiovascular Disease

## 2024-06-23 VITALS — BP 114/74 | HR 68 | Ht 72.0 in | Wt 168.2 lb

## 2024-06-23 DIAGNOSIS — E7800 Pure hypercholesterolemia, unspecified: Secondary | ICD-10-CM | POA: Diagnosis present

## 2024-06-23 DIAGNOSIS — I428 Other cardiomyopathies: Secondary | ICD-10-CM | POA: Insufficient documentation

## 2024-06-23 DIAGNOSIS — I4819 Other persistent atrial fibrillation: Secondary | ICD-10-CM | POA: Insufficient documentation

## 2024-06-23 DIAGNOSIS — I2510 Atherosclerotic heart disease of native coronary artery without angina pectoris: Secondary | ICD-10-CM | POA: Insufficient documentation

## 2024-06-23 DIAGNOSIS — Z952 Presence of prosthetic heart valve: Secondary | ICD-10-CM | POA: Insufficient documentation

## 2024-06-23 DIAGNOSIS — I351 Nonrheumatic aortic (valve) insufficiency: Secondary | ICD-10-CM | POA: Insufficient documentation

## 2024-06-23 NOTE — Patient Instructions (Signed)
 Medication Instructions:  Your physician recommends that you continue on your current medications as directed. Please refer to the Current Medication list given to you today.  *If you need a refill on your cardiac medications before your next appointment, please call your pharmacy*  Lab Work: none If you have labs (blood work) drawn today and your tests are completely normal, you will receive your results only by: MyChart Message (if you have MyChart) OR A paper copy in the mail If you have any lab test that is abnormal or we need to change your treatment, we will call you to review the results.  Testing/Procedures: Your physician has requested that you have an echocardiogram. Echocardiography is a painless test that uses sound waves to create images of your heart. It provides your doctor with information about the size and shape of your heart and how well your heart's chambers and valves are working. This procedure takes approximately one hour. There are no restrictions for this procedure. Please do NOT wear cologne, perfume, aftershave, or lotions (deodorant is allowed). Please arrive 15 minutes prior to your appointment time.  Please note: We ask at that you not bring children with you during ultrasound (echo/ vascular) testing. Due to room size and safety concerns, children are not allowed in the ultrasound rooms during exams. Our front office staff cannot provide observation of children in our lobby area while testing is being conducted. An adult accompanying a patient to their appointment will only be allowed in the ultrasound room at the discretion of the ultrasound technician under special circumstances. We apologize for any inconvenience.   Follow-Up: At [facility], you and your health needs are our priority.  As part of our continuing mission to provide you with exceptional heart care, our providers are all part of one team.  This team includes your primary Cardiologist  (physician) and Advanced Practice Providers or APPs (Physician Assistants and Nurse Practitioners) who all work together to provide you with the care you need, when you need it.  Your next appointment:   12 month(s)  Provider:   Lonni Cash, MD    We recommend signing up for the patient portal called MyChart.  Sign up information is provided on this After Visit Summary.  MyChart is used to connect with patients for Virtual Visits (Telemedicine).  Patients are able to view lab/test results, encounter notes, upcoming appointments, etc.  Non-urgent messages can be sent to your provider as well.   To learn more about what you can do with MyChart, go to [email or web address].   Other Instructions You have been referred to see electrophysiology in our office

## 2024-06-28 DIAGNOSIS — M545 Low back pain, unspecified: Secondary | ICD-10-CM | POA: Diagnosis not present

## 2024-07-05 DIAGNOSIS — M545 Low back pain, unspecified: Secondary | ICD-10-CM | POA: Diagnosis not present

## 2024-08-01 ENCOUNTER — Ambulatory Visit

## 2024-08-01 ENCOUNTER — Ambulatory Visit (HOSPITAL_COMMUNITY)
Admission: RE | Admit: 2024-08-01 | Discharge: 2024-08-01 | Disposition: A | Discharge status: Home / Self Care | Source: Ambulatory Visit | Attending: Cardiovascular Disease | Admitting: Cardiovascular Disease

## 2024-08-01 DIAGNOSIS — Z952 Presence of prosthetic heart valve: Secondary | ICD-10-CM | POA: Diagnosis present

## 2024-08-01 DIAGNOSIS — I4819 Other persistent atrial fibrillation: Secondary | ICD-10-CM | POA: Insufficient documentation

## 2024-08-01 DIAGNOSIS — I428 Other cardiomyopathies: Secondary | ICD-10-CM | POA: Insufficient documentation

## 2024-08-01 DIAGNOSIS — I351 Nonrheumatic aortic (valve) insufficiency: Secondary | ICD-10-CM | POA: Insufficient documentation

## 2024-08-01 DIAGNOSIS — I2510 Atherosclerotic heart disease of native coronary artery without angina pectoris: Secondary | ICD-10-CM | POA: Diagnosis not present

## 2024-08-01 LAB — ECHOCARDIOGRAM COMPLETE
AR max vel: 0.92 cm2
AV Area VTI: 0.83 cm2
AV Area mean vel: 0.89 cm2
AV Mean grad: 14 mmHg
AV Peak grad: 24.6 mmHg
Ao pk vel: 2.48 m/s
Area-P 1/2: 4.35 cm2
Calc EF: 55 %
Est EF: 50
P 1/2 time: 842 ms
S' Lateral: 2.6 cm
Single Plane A2C EF: 54.9 %
Single Plane A4C EF: 53.6 %

## 2024-08-01 NOTE — Progress Notes (Signed)
*  PRELIMINARY RESULTS* Echocardiogram 2D Echocardiogram has been performed.  Nicholas Sutton 08/01/2024, 11:58 AM

## 2024-08-02 ENCOUNTER — Ambulatory Visit: Payer: Self-pay | Admitting: Cardiovascular Disease

## 2024-08-02 DIAGNOSIS — I428 Other cardiomyopathies: Secondary | ICD-10-CM

## 2024-08-02 DIAGNOSIS — I4819 Other persistent atrial fibrillation: Secondary | ICD-10-CM

## 2024-08-02 DIAGNOSIS — I351 Nonrheumatic aortic (valve) insufficiency: Secondary | ICD-10-CM

## 2024-08-02 DIAGNOSIS — Z952 Presence of prosthetic heart valve: Secondary | ICD-10-CM

## 2024-08-24 ENCOUNTER — Other Ambulatory Visit: Payer: Self-pay | Admitting: Cardiovascular Disease

## 2024-08-24 DIAGNOSIS — I4819 Other persistent atrial fibrillation: Secondary | ICD-10-CM

## 2024-08-25 NOTE — Telephone Encounter (Signed)
 Prescription refill request for Eliquis  received. Indication: AFIB Last office visit: 07/03/24 Scr: 1.05 (05/24/24) Age: 79 Weight: 76.3KG

## 2024-10-07 ENCOUNTER — Other Ambulatory Visit (HOSPITAL_COMMUNITY)

## 2024-11-01 ENCOUNTER — Ambulatory Visit: Admitting: Cardiology
